# Patient Record
Sex: Female | Born: 1940
Health system: Southern US, Community
[De-identification: ages and names within clinical notes are randomized; demographics above are authoritative.]

## PROBLEM LIST (undated history)

## (undated) DIAGNOSIS — J189 Pneumonia, unspecified organism: Secondary | ICD-10-CM

## (undated) DIAGNOSIS — K2100 Gastro-esophageal reflux disease with esophagitis, without bleeding: Secondary | ICD-10-CM

## (undated) DIAGNOSIS — M810 Age-related osteoporosis without current pathological fracture: Secondary | ICD-10-CM

## (undated) DIAGNOSIS — E7800 Pure hypercholesterolemia, unspecified: Secondary | ICD-10-CM

## (undated) DIAGNOSIS — Z860100 Personal history of colon polyps, unspecified: Secondary | ICD-10-CM

## (undated) DIAGNOSIS — R0600 Dyspnea, unspecified: Secondary | ICD-10-CM

## (undated) DIAGNOSIS — K449 Diaphragmatic hernia without obstruction or gangrene: Secondary | ICD-10-CM

## (undated) DIAGNOSIS — A310 Pulmonary mycobacterial infection: Secondary | ICD-10-CM

## (undated) DIAGNOSIS — D329 Benign neoplasm of meninges, unspecified: Secondary | ICD-10-CM

## (undated) DIAGNOSIS — K2970 Gastritis, unspecified, without bleeding: Secondary | ICD-10-CM

## (undated) DIAGNOSIS — D509 Iron deficiency anemia, unspecified: Secondary | ICD-10-CM

## (undated) DIAGNOSIS — Z9889 Other specified postprocedural states: Secondary | ICD-10-CM

## (undated) DIAGNOSIS — K219 Gastro-esophageal reflux disease without esophagitis: Secondary | ICD-10-CM

## (undated) DIAGNOSIS — M5481 Occipital neuralgia: Secondary | ICD-10-CM

## (undated) DIAGNOSIS — I700 Atherosclerosis of aorta: Secondary | ICD-10-CM

## (undated) DIAGNOSIS — I10 Essential (primary) hypertension: Secondary | ICD-10-CM

## (undated) DIAGNOSIS — E119 Type 2 diabetes mellitus without complications: Secondary | ICD-10-CM

## (undated) DIAGNOSIS — J45909 Unspecified asthma, uncomplicated: Secondary | ICD-10-CM

## (undated) DIAGNOSIS — N3946 Mixed incontinence: Secondary | ICD-10-CM

## (undated) DIAGNOSIS — I639 Cerebral infarction, unspecified: Secondary | ICD-10-CM

## (undated) DIAGNOSIS — N6019 Diffuse cystic mastopathy of unspecified breast: Secondary | ICD-10-CM

## (undated) HISTORY — DX: Gastro-esophageal reflux disease with esophagitis: K21.0

## (undated) HISTORY — DX: Essential (primary) hypertension: I10

## (undated) HISTORY — DX: Diffuse cystic mastopathy of unspecified breast: N60.19

## (undated) HISTORY — PX: BREAST EXCISIONAL BIOPSY: SUR124

## (undated) HISTORY — DX: Pure hypercholesterolemia, unspecified: E78.00

## (undated) HISTORY — DX: Personal history of colon polyps, unspecified: Z86.0100

## (undated) HISTORY — PX: EYE SURGERY: SHX253

## (undated) HISTORY — DX: Gastro-esophageal reflux disease with esophagitis, without bleeding: K21.00

## (undated) HISTORY — PX: HEMORRHOID SURGERY: SHX153

## (undated) HISTORY — PX: FOOT SURGERY: SHX648

## (undated) HISTORY — DX: Personal history of colonic polyps: Z86.010

## (undated) HISTORY — DX: Gastritis, unspecified, without bleeding: K29.70

## (undated) HISTORY — PX: NASAL SEPTUM SURGERY: SHX37

## (undated) HISTORY — DX: Age-related osteoporosis without current pathological fracture: M81.0

## (undated) HISTORY — PX: OTHER SURGICAL HISTORY: SHX169

## (undated) HISTORY — PX: BREAST SURGERY: SHX581

## (undated) HISTORY — DX: Pulmonary mycobacterial infection: A31.0

## (undated) MED FILL — Iron Sucrose Inj 20 MG/ML (Fe Equiv): INTRAVENOUS | Qty: 10 | Status: AC

---

## 1996-05-15 HISTORY — PX: ABDOMINAL HYSTERECTOMY: SHX81

## 2004-03-10 ENCOUNTER — Ambulatory Visit: Payer: Self-pay | Admitting: Internal Medicine

## 2005-03-22 ENCOUNTER — Ambulatory Visit: Payer: Self-pay | Admitting: Internal Medicine

## 2005-03-31 ENCOUNTER — Ambulatory Visit: Payer: Self-pay | Admitting: Internal Medicine

## 2005-04-12 ENCOUNTER — Ambulatory Visit: Payer: Self-pay | Admitting: Internal Medicine

## 2005-04-19 ENCOUNTER — Ambulatory Visit: Payer: Self-pay | Admitting: Internal Medicine

## 2005-05-15 ENCOUNTER — Ambulatory Visit: Payer: Self-pay | Admitting: Internal Medicine

## 2005-05-23 ENCOUNTER — Ambulatory Visit: Payer: Self-pay | Admitting: Internal Medicine

## 2005-07-25 ENCOUNTER — Ambulatory Visit: Payer: Self-pay | Admitting: Internal Medicine

## 2005-07-25 ENCOUNTER — Ambulatory Visit: Payer: Self-pay | Admitting: Urology

## 2005-09-19 ENCOUNTER — Ambulatory Visit: Payer: Self-pay | Admitting: Gastroenterology

## 2005-10-03 ENCOUNTER — Ambulatory Visit: Payer: Self-pay | Admitting: Gastroenterology

## 2005-10-30 ENCOUNTER — Ambulatory Visit: Payer: Self-pay | Admitting: Internal Medicine

## 2006-02-05 ENCOUNTER — Ambulatory Visit: Payer: Self-pay | Admitting: Surgery

## 2006-02-12 HISTORY — PX: HERNIA REPAIR: SHX51

## 2006-02-12 HISTORY — PX: OTHER SURGICAL HISTORY: SHX169

## 2006-04-17 ENCOUNTER — Ambulatory Visit: Payer: Self-pay | Admitting: Internal Medicine

## 2006-04-24 ENCOUNTER — Ambulatory Visit: Payer: Self-pay | Admitting: Internal Medicine

## 2007-05-01 ENCOUNTER — Ambulatory Visit: Payer: Self-pay | Admitting: Internal Medicine

## 2007-05-06 ENCOUNTER — Ambulatory Visit: Payer: Self-pay | Admitting: Internal Medicine

## 2007-07-05 ENCOUNTER — Ambulatory Visit: Payer: Self-pay | Admitting: Unknown Physician Specialty

## 2007-07-09 ENCOUNTER — Ambulatory Visit: Payer: Self-pay | Admitting: Urology

## 2007-08-07 ENCOUNTER — Ambulatory Visit: Payer: Self-pay | Admitting: Urology

## 2007-12-02 ENCOUNTER — Ambulatory Visit: Payer: Self-pay | Admitting: Internal Medicine

## 2008-05-05 ENCOUNTER — Ambulatory Visit: Payer: Self-pay | Admitting: Internal Medicine

## 2008-05-15 HISTORY — PX: BREAST EXCISIONAL BIOPSY: SUR124

## 2008-10-13 ENCOUNTER — Ambulatory Visit: Payer: Self-pay | Admitting: Internal Medicine

## 2008-10-20 ENCOUNTER — Ambulatory Visit: Payer: Self-pay | Admitting: Internal Medicine

## 2008-10-28 ENCOUNTER — Ambulatory Visit: Payer: Self-pay | Admitting: Internal Medicine

## 2008-10-30 ENCOUNTER — Ambulatory Visit: Payer: Self-pay | Admitting: Internal Medicine

## 2008-11-09 ENCOUNTER — Ambulatory Visit: Payer: Self-pay | Admitting: Internal Medicine

## 2008-11-12 ENCOUNTER — Ambulatory Visit: Payer: Self-pay | Admitting: Internal Medicine

## 2008-12-15 ENCOUNTER — Emergency Department: Payer: Self-pay | Admitting: Internal Medicine

## 2009-05-13 ENCOUNTER — Ambulatory Visit: Payer: Self-pay | Admitting: Family Medicine

## 2009-09-01 ENCOUNTER — Ambulatory Visit: Payer: Self-pay

## 2009-10-19 ENCOUNTER — Ambulatory Visit: Payer: Self-pay | Admitting: Unknown Physician Specialty

## 2010-01-12 ENCOUNTER — Ambulatory Visit: Payer: Self-pay | Admitting: Sports Medicine

## 2010-06-24 ENCOUNTER — Ambulatory Visit: Payer: Self-pay | Admitting: Internal Medicine

## 2010-11-07 ENCOUNTER — Ambulatory Visit: Payer: Self-pay | Admitting: Internal Medicine

## 2011-08-07 ENCOUNTER — Ambulatory Visit: Payer: Self-pay | Admitting: Internal Medicine

## 2011-08-07 DIAGNOSIS — Z1231 Encounter for screening mammogram for malignant neoplasm of breast: Secondary | ICD-10-CM | POA: Diagnosis not present

## 2011-08-29 DIAGNOSIS — M069 Rheumatoid arthritis, unspecified: Secondary | ICD-10-CM | POA: Diagnosis not present

## 2011-08-29 DIAGNOSIS — J40 Bronchitis, not specified as acute or chronic: Secondary | ICD-10-CM | POA: Diagnosis not present

## 2011-08-29 DIAGNOSIS — F17200 Nicotine dependence, unspecified, uncomplicated: Secondary | ICD-10-CM | POA: Diagnosis not present

## 2011-08-29 DIAGNOSIS — R918 Other nonspecific abnormal finding of lung field: Secondary | ICD-10-CM | POA: Diagnosis not present

## 2011-08-29 DIAGNOSIS — J449 Chronic obstructive pulmonary disease, unspecified: Secondary | ICD-10-CM | POA: Diagnosis not present

## 2011-08-29 DIAGNOSIS — E785 Hyperlipidemia, unspecified: Secondary | ICD-10-CM | POA: Diagnosis not present

## 2011-08-29 DIAGNOSIS — R059 Cough, unspecified: Secondary | ICD-10-CM | POA: Diagnosis not present

## 2011-09-06 DIAGNOSIS — E7800 Pure hypercholesterolemia, unspecified: Secondary | ICD-10-CM | POA: Diagnosis not present

## 2011-09-06 DIAGNOSIS — R5383 Other fatigue: Secondary | ICD-10-CM | POA: Diagnosis not present

## 2011-09-06 DIAGNOSIS — M899 Disorder of bone, unspecified: Secondary | ICD-10-CM | POA: Diagnosis not present

## 2011-09-06 DIAGNOSIS — Z79899 Other long term (current) drug therapy: Secondary | ICD-10-CM | POA: Diagnosis not present

## 2011-09-06 DIAGNOSIS — D709 Neutropenia, unspecified: Secondary | ICD-10-CM | POA: Diagnosis not present

## 2011-09-06 DIAGNOSIS — I10 Essential (primary) hypertension: Secondary | ICD-10-CM | POA: Diagnosis not present

## 2011-09-06 DIAGNOSIS — R5381 Other malaise: Secondary | ICD-10-CM | POA: Diagnosis not present

## 2011-09-06 LAB — TSH: TSH: 2.85 u[IU]/mL (ref 0.41–5.90)

## 2011-11-07 ENCOUNTER — Ambulatory Visit: Payer: Self-pay | Admitting: Internal Medicine

## 2011-11-07 DIAGNOSIS — R1013 Epigastric pain: Secondary | ICD-10-CM | POA: Diagnosis not present

## 2011-11-07 DIAGNOSIS — Q619 Cystic kidney disease, unspecified: Secondary | ICD-10-CM | POA: Diagnosis not present

## 2011-11-21 ENCOUNTER — Ambulatory Visit: Payer: Self-pay | Admitting: Internal Medicine

## 2011-11-21 DIAGNOSIS — K7689 Other specified diseases of liver: Secondary | ICD-10-CM | POA: Diagnosis not present

## 2011-11-21 DIAGNOSIS — K449 Diaphragmatic hernia without obstruction or gangrene: Secondary | ICD-10-CM | POA: Diagnosis not present

## 2011-11-21 LAB — CREATININE, SERUM: EGFR (Non-African Amer.): 54 — ABNORMAL LOW

## 2011-11-28 DIAGNOSIS — R918 Other nonspecific abnormal finding of lung field: Secondary | ICD-10-CM | POA: Diagnosis not present

## 2011-11-28 DIAGNOSIS — J479 Bronchiectasis, uncomplicated: Secondary | ICD-10-CM | POA: Diagnosis not present

## 2011-11-28 DIAGNOSIS — A310 Pulmonary mycobacterial infection: Secondary | ICD-10-CM | POA: Diagnosis not present

## 2012-01-11 DIAGNOSIS — I10 Essential (primary) hypertension: Secondary | ICD-10-CM | POA: Diagnosis not present

## 2012-01-11 DIAGNOSIS — R1310 Dysphagia, unspecified: Secondary | ICD-10-CM | POA: Diagnosis not present

## 2012-01-11 DIAGNOSIS — K589 Irritable bowel syndrome without diarrhea: Secondary | ICD-10-CM | POA: Diagnosis not present

## 2012-02-13 ENCOUNTER — Ambulatory Visit: Payer: Self-pay | Admitting: Unknown Physician Specialty

## 2012-02-13 DIAGNOSIS — Z7982 Long term (current) use of aspirin: Secondary | ICD-10-CM | POA: Diagnosis not present

## 2012-02-13 DIAGNOSIS — K222 Esophageal obstruction: Secondary | ICD-10-CM | POA: Diagnosis not present

## 2012-02-13 DIAGNOSIS — K219 Gastro-esophageal reflux disease without esophagitis: Secondary | ICD-10-CM | POA: Diagnosis not present

## 2012-02-13 DIAGNOSIS — R1310 Dysphagia, unspecified: Secondary | ICD-10-CM | POA: Diagnosis not present

## 2012-02-13 DIAGNOSIS — N6019 Diffuse cystic mastopathy of unspecified breast: Secondary | ICD-10-CM | POA: Diagnosis not present

## 2012-02-13 DIAGNOSIS — R000 Tachycardia, unspecified: Secondary | ICD-10-CM | POA: Diagnosis not present

## 2012-02-13 DIAGNOSIS — K3189 Other diseases of stomach and duodenum: Secondary | ICD-10-CM | POA: Diagnosis not present

## 2012-02-13 DIAGNOSIS — E785 Hyperlipidemia, unspecified: Secondary | ICD-10-CM | POA: Diagnosis not present

## 2012-02-13 DIAGNOSIS — Z79899 Other long term (current) drug therapy: Secondary | ICD-10-CM | POA: Diagnosis not present

## 2012-02-13 DIAGNOSIS — M1990 Unspecified osteoarthritis, unspecified site: Secondary | ICD-10-CM | POA: Diagnosis not present

## 2012-02-13 DIAGNOSIS — D131 Benign neoplasm of stomach: Secondary | ICD-10-CM | POA: Diagnosis not present

## 2012-02-14 LAB — PATHOLOGY REPORT

## 2012-05-28 DIAGNOSIS — J479 Bronchiectasis, uncomplicated: Secondary | ICD-10-CM | POA: Diagnosis not present

## 2012-05-28 DIAGNOSIS — E785 Hyperlipidemia, unspecified: Secondary | ICD-10-CM | POA: Diagnosis not present

## 2012-05-28 DIAGNOSIS — L570 Actinic keratosis: Secondary | ICD-10-CM | POA: Diagnosis not present

## 2012-05-28 DIAGNOSIS — D485 Neoplasm of uncertain behavior of skin: Secondary | ICD-10-CM | POA: Diagnosis not present

## 2012-05-28 DIAGNOSIS — B079 Viral wart, unspecified: Secondary | ICD-10-CM | POA: Diagnosis not present

## 2012-05-28 DIAGNOSIS — A310 Pulmonary mycobacterial infection: Secondary | ICD-10-CM | POA: Diagnosis not present

## 2012-05-28 DIAGNOSIS — M069 Rheumatoid arthritis, unspecified: Secondary | ICD-10-CM | POA: Diagnosis not present

## 2012-05-31 ENCOUNTER — Ambulatory Visit: Payer: Self-pay | Admitting: Internal Medicine

## 2012-06-25 ENCOUNTER — Ambulatory Visit: Payer: Self-pay | Admitting: Internal Medicine

## 2012-08-13 DIAGNOSIS — A310 Pulmonary mycobacterial infection: Secondary | ICD-10-CM | POA: Diagnosis not present

## 2012-08-13 DIAGNOSIS — E785 Hyperlipidemia, unspecified: Secondary | ICD-10-CM | POA: Diagnosis not present

## 2012-08-13 DIAGNOSIS — M069 Rheumatoid arthritis, unspecified: Secondary | ICD-10-CM | POA: Diagnosis not present

## 2012-08-13 DIAGNOSIS — Z79899 Other long term (current) drug therapy: Secondary | ICD-10-CM | POA: Diagnosis not present

## 2012-08-13 DIAGNOSIS — J449 Chronic obstructive pulmonary disease, unspecified: Secondary | ICD-10-CM | POA: Diagnosis not present

## 2012-08-13 DIAGNOSIS — J479 Bronchiectasis, uncomplicated: Secondary | ICD-10-CM | POA: Diagnosis not present

## 2012-08-29 ENCOUNTER — Ambulatory Visit (INDEPENDENT_AMBULATORY_CARE_PROVIDER_SITE_OTHER): Payer: Medicare Other | Admitting: Internal Medicine

## 2012-08-29 ENCOUNTER — Encounter: Payer: Self-pay | Admitting: Internal Medicine

## 2012-08-29 VITALS — BP 120/80 | HR 66 | Temp 97.9°F | Ht 64.25 in | Wt 184.5 lb

## 2012-08-29 DIAGNOSIS — R200 Anesthesia of skin: Secondary | ICD-10-CM

## 2012-08-29 DIAGNOSIS — R0602 Shortness of breath: Secondary | ICD-10-CM

## 2012-08-29 DIAGNOSIS — R5383 Other fatigue: Secondary | ICD-10-CM

## 2012-08-29 DIAGNOSIS — K2990 Gastroduodenitis, unspecified, without bleeding: Secondary | ICD-10-CM

## 2012-08-29 DIAGNOSIS — I10 Essential (primary) hypertension: Secondary | ICD-10-CM

## 2012-08-29 DIAGNOSIS — E7800 Pure hypercholesterolemia, unspecified: Secondary | ICD-10-CM

## 2012-08-29 DIAGNOSIS — K2970 Gastritis, unspecified, without bleeding: Secondary | ICD-10-CM

## 2012-08-29 DIAGNOSIS — R209 Unspecified disturbances of skin sensation: Secondary | ICD-10-CM

## 2012-08-29 DIAGNOSIS — Z1239 Encounter for other screening for malignant neoplasm of breast: Secondary | ICD-10-CM

## 2012-08-29 DIAGNOSIS — A310 Pulmonary mycobacterial infection: Secondary | ICD-10-CM

## 2012-08-29 DIAGNOSIS — R5381 Other malaise: Secondary | ICD-10-CM | POA: Diagnosis not present

## 2012-08-29 DIAGNOSIS — Z86010 Personal history of colonic polyps: Secondary | ICD-10-CM

## 2012-08-29 LAB — CBC WITH DIFFERENTIAL/PLATELET
Basophils Relative: 0.8 % (ref 0.0–3.0)
Eosinophils Absolute: 0.2 10*3/uL (ref 0.0–0.7)
Eosinophils Relative: 3.9 % (ref 0.0–5.0)
HCT: 41.5 % (ref 36.0–46.0)
Lymphs Abs: 1.6 10*3/uL (ref 0.7–4.0)
MCHC: 33.7 g/dL (ref 30.0–36.0)
MCV: 94.6 fl (ref 78.0–100.0)
Monocytes Absolute: 0.4 10*3/uL (ref 0.1–1.0)
Neutrophils Relative %: 56.2 % (ref 43.0–77.0)
Platelets: 268 10*3/uL (ref 150.0–400.0)
WBC: 5.3 10*3/uL (ref 4.5–10.5)

## 2012-08-29 LAB — HEPATIC FUNCTION PANEL
Alkaline Phosphatase: 74 U/L (ref 39–117)
Bilirubin, Direct: 0.1 mg/dL (ref 0.0–0.3)
Total Bilirubin: 0.5 mg/dL (ref 0.3–1.2)

## 2012-08-29 LAB — LIPID PANEL
LDL Cholesterol: 63 mg/dL (ref 0–99)
VLDL: 18.2 mg/dL (ref 0.0–40.0)

## 2012-08-29 LAB — BASIC METABOLIC PANEL
BUN: 10 mg/dL (ref 6–23)
Chloride: 104 mEq/L (ref 96–112)
Creatinine, Ser: 0.8 mg/dL (ref 0.4–1.2)
Glucose, Bld: 103 mg/dL — ABNORMAL HIGH (ref 70–99)
Potassium: 4.3 mEq/L (ref 3.5–5.1)

## 2012-08-29 LAB — TSH: TSH: 1.91 u[IU]/mL (ref 0.35–5.50)

## 2012-08-29 MED ORDER — ESOMEPRAZOLE MAGNESIUM 40 MG PO CPDR: 40.0000 mg | DELAYED_RELEASE_CAPSULE | Freq: Two times a day (BID) | ORAL | Status: DC

## 2012-08-29 MED ORDER — ROSUVASTATIN CALCIUM 10 MG PO TABS: 10.0000 mg | ORAL_TABLET | Freq: Every day | ORAL | Status: DC

## 2012-08-29 MED ORDER — METOPROLOL SUCCINATE ER 25 MG PO TB24: 25.0000 mg | ORAL_TABLET | Freq: Two times a day (BID) | ORAL | Status: DC

## 2012-08-30 DIAGNOSIS — J45909 Unspecified asthma, uncomplicated: Secondary | ICD-10-CM | POA: Diagnosis not present

## 2012-08-30 DIAGNOSIS — E785 Hyperlipidemia, unspecified: Secondary | ICD-10-CM | POA: Diagnosis not present

## 2012-08-30 DIAGNOSIS — R918 Other nonspecific abnormal finding of lung field: Secondary | ICD-10-CM | POA: Diagnosis not present

## 2012-08-30 DIAGNOSIS — M069 Rheumatoid arthritis, unspecified: Secondary | ICD-10-CM | POA: Diagnosis not present

## 2012-08-30 DIAGNOSIS — A319 Mycobacterial infection, unspecified: Secondary | ICD-10-CM | POA: Diagnosis not present

## 2012-08-30 DIAGNOSIS — J449 Chronic obstructive pulmonary disease, unspecified: Secondary | ICD-10-CM | POA: Diagnosis not present

## 2012-08-31 ENCOUNTER — Encounter: Payer: Self-pay | Admitting: Internal Medicine

## 2012-08-31 DIAGNOSIS — A310 Pulmonary mycobacterial infection: Secondary | ICD-10-CM | POA: Insufficient documentation

## 2012-08-31 DIAGNOSIS — Z86010 Personal history of colonic polyps: Secondary | ICD-10-CM | POA: Insufficient documentation

## 2012-08-31 DIAGNOSIS — E7800 Pure hypercholesterolemia, unspecified: Secondary | ICD-10-CM | POA: Insufficient documentation

## 2012-08-31 DIAGNOSIS — K2970 Gastritis, unspecified, without bleeding: Secondary | ICD-10-CM | POA: Insufficient documentation

## 2012-08-31 DIAGNOSIS — I10 Essential (primary) hypertension: Secondary | ICD-10-CM | POA: Insufficient documentation

## 2012-08-31 NOTE — Assessment & Plan Note (Signed)
On crestor.  Low cholesterol diet and exercise.  Check lipid panel and liver function.  

## 2012-08-31 NOTE — Assessment & Plan Note (Signed)
Followed by pulmonary at [facility].  Has follow up tomorrow.  On advair.  SOB with exertion.  Follow.

## 2012-08-31 NOTE — Assessment & Plan Note (Signed)
EGD revealed hiatal hernia, nonobstructing Schatzki's ring, gastric polyp and gastritis.  On Nexium.  Follow.

## 2012-08-31 NOTE — Assessment & Plan Note (Signed)
Blood pressure doing well.  On metoprolol.  Follow.    

## 2012-08-31 NOTE — Progress Notes (Signed)
  Subjective:    [name], female    DOB: Oct 05, 1940, 72 y.o.   MRN: [id number]  HPI 72 year old female with past history of osteoarthritis, reflux esophagitis, hyperlipidemia, MAI and FCD who comes in today for a scheduled follow up.  She was a former pt of mine at [employer].  She states she has been doing relatively well.  She is being followed by pulmonary at [facility].   Had sputum cultures.  Has follow up tomorrow.  Using Advair bid now.  Is helping some.  Gets out of breath with exertion.  Previously had cardiac w/up.  Request referral back to cardiology for evaluation.  No acid reflux.  No nausea or vomiting.  No bowel change.  She does report preciously noticing what she described as fullness/numbness in the left side of her face and left upper arm.  Lasted for 1-2 weeks.  Resolved now.  Was not evaluated.  Was questioning if some of the facial symptoms were related to sinus.  Denies any facial drooping.  No speech change.     Past Medical History  Diagnosis Date  . Osteoporosis   . Fibrocystic breast disease   . Reflux esophagitis   . Hypercholesterolemia   . MAI (mycobacterium avium-intracellulare) infection   . Hypertension   . History of colon polyps   . Gastritis     EGD - schatzki's ring, gastritis and gastric polyps    Review of Systems    Patient denies any headache, lightheadedness or dizziness.  No chest pain, tightness or palpatations. Increased sob with exertion.  advair helping her breathing.  No increased acid reflux.  No abdominal pain or cramping.  No bowel change.  No urine change.  Being followed by pulmonary at [facility].  Request referral back to cardiology for the sob with exertion.  Previous facial numbness as outlined.         Objective:   Physical Exam Filed Vitals:   08/29/12 0849  BP: 120/80  Pulse: 66  Temp: 97.9 F (30.73 C)   72 year old female in no acute distress. HEENT:  Nares- clear.  Oropharynx - without lesions. NECK:  Supple.  Nontender.   No audible bruit.  HEART:  Appears to be regular. LUNGS:  No crackles or wheezing audible.  Respirations even and unlabored.  RADIAL PULSE:  Equal bilaterally.  ABDOMEN:  Soft, nontender.  Bowel sounds present and normal.  No audible abdominal bruit.   EXTREMITIES:  No increased edema present.  DP pulses palpable and equal bilaterally.          Assessment & Plan:  CARDIOVASCULAR.  Describes increased sob with exertion.  She request referral to cardiology.  Offered EKG today.  She declines.  Will place order for referral.  Continue toprol and aspirin.  Follow.   DERMATOLOGY.  Continue follow up with Dr Gwenlyn Fudge.   FACIAL NUMBNESS.  Unclear etiology. No reoccurring symptoms.  On aspirin.  Continue aspirin.  Check carotid ultrasound.  Check MRI brain.  Further w/up pending.  If any reoccurrence, she is to be evaluated immediately.   HEALTH MAINTENANCE.  Last physical 09/05/11.  S/p hysterectomy.  Mammogram 08/07/11 - Birads II. Colonoscopy 6/11 as outlined. Due follow up colonoscopy 2016.

## 2012-08-31 NOTE — Assessment & Plan Note (Signed)
Colonoscopy 6/11 revealed two small polyps and internal hemorrhoids.  Due follow up colonoscopy 2016.   

## 2012-09-05 ENCOUNTER — Ambulatory Visit: Payer: Self-pay | Admitting: Internal Medicine

## 2012-09-05 DIAGNOSIS — R209 Unspecified disturbances of skin sensation: Secondary | ICD-10-CM | POA: Diagnosis not present

## 2012-09-06 ENCOUNTER — Telehealth: Payer: Self-pay | Admitting: *Deleted

## 2012-09-06 NOTE — Telephone Encounter (Signed)
Spoke with pt & informed her of her MRI Brain results from 09/05/12 (no acute abnormality)

## 2012-09-10 ENCOUNTER — Telehealth: Payer: Self-pay | Admitting: *Deleted

## 2012-09-10 NOTE — Telephone Encounter (Signed)
Notified pt of Carotid U/S results from 09/05/12

## 2012-09-19 DIAGNOSIS — K7689 Other specified diseases of liver: Secondary | ICD-10-CM | POA: Diagnosis not present

## 2012-09-19 DIAGNOSIS — J984 Other disorders of lung: Secondary | ICD-10-CM | POA: Diagnosis not present

## 2012-09-19 DIAGNOSIS — A319 Mycobacterial infection, unspecified: Secondary | ICD-10-CM | POA: Diagnosis not present

## 2012-09-19 DIAGNOSIS — K449 Diaphragmatic hernia without obstruction or gangrene: Secondary | ICD-10-CM | POA: Diagnosis not present

## 2012-09-20 DIAGNOSIS — J45909 Unspecified asthma, uncomplicated: Secondary | ICD-10-CM | POA: Diagnosis not present

## 2012-09-24 ENCOUNTER — Encounter: Payer: Self-pay | Admitting: Internal Medicine

## 2012-10-01 ENCOUNTER — Ambulatory Visit: Payer: Self-pay | Admitting: Internal Medicine

## 2012-10-01 DIAGNOSIS — Z1231 Encounter for screening mammogram for malignant neoplasm of breast: Secondary | ICD-10-CM | POA: Diagnosis not present

## 2012-10-16 DIAGNOSIS — A429 Actinomycosis, unspecified: Secondary | ICD-10-CM | POA: Diagnosis not present

## 2012-10-25 ENCOUNTER — Encounter: Payer: Self-pay | Admitting: Internal Medicine

## 2012-11-04 DIAGNOSIS — A420 Pulmonary actinomycosis: Secondary | ICD-10-CM | POA: Diagnosis not present

## 2012-11-19 ENCOUNTER — Encounter: Payer: 59 | Admitting: Internal Medicine

## 2012-11-22 DIAGNOSIS — J45909 Unspecified asthma, uncomplicated: Secondary | ICD-10-CM | POA: Diagnosis not present

## 2012-11-22 DIAGNOSIS — G4710 Hypersomnia, unspecified: Secondary | ICD-10-CM | POA: Diagnosis not present

## 2012-11-22 DIAGNOSIS — R109 Unspecified abdominal pain: Secondary | ICD-10-CM | POA: Diagnosis not present

## 2012-11-22 DIAGNOSIS — A4289 Other forms of actinomycosis: Secondary | ICD-10-CM | POA: Diagnosis not present

## 2012-12-30 ENCOUNTER — Other Ambulatory Visit: Payer: Self-pay | Admitting: *Deleted

## 2013-01-17 DIAGNOSIS — I700 Atherosclerosis of aorta: Secondary | ICD-10-CM | POA: Diagnosis not present

## 2013-01-17 DIAGNOSIS — R918 Other nonspecific abnormal finding of lung field: Secondary | ICD-10-CM | POA: Diagnosis not present

## 2013-01-17 DIAGNOSIS — K449 Diaphragmatic hernia without obstruction or gangrene: Secondary | ICD-10-CM | POA: Diagnosis not present

## 2013-03-28 ENCOUNTER — Telehealth: Payer: Self-pay | Admitting: Internal Medicine

## 2013-03-28 ENCOUNTER — Encounter: Payer: Self-pay | Admitting: Internal Medicine

## 2013-03-28 ENCOUNTER — Ambulatory Visit (INDEPENDENT_AMBULATORY_CARE_PROVIDER_SITE_OTHER): Payer: Medicare Other | Admitting: Internal Medicine

## 2013-03-28 VITALS — BP 120/80 | HR 81 | Temp 97.8°F | Ht 64.25 in | Wt 178.8 lb

## 2013-03-28 DIAGNOSIS — R51 Headache: Secondary | ICD-10-CM

## 2013-03-28 DIAGNOSIS — M542 Cervicalgia: Secondary | ICD-10-CM

## 2013-03-28 DIAGNOSIS — E7800 Pure hypercholesterolemia, unspecified: Secondary | ICD-10-CM

## 2013-03-28 DIAGNOSIS — R200 Anesthesia of skin: Secondary | ICD-10-CM

## 2013-03-28 DIAGNOSIS — A310 Pulmonary mycobacterial infection: Secondary | ICD-10-CM

## 2013-03-28 DIAGNOSIS — Z86010 Personal history of colonic polyps: Secondary | ICD-10-CM

## 2013-03-28 DIAGNOSIS — R209 Unspecified disturbances of skin sensation: Secondary | ICD-10-CM | POA: Diagnosis not present

## 2013-03-28 DIAGNOSIS — I10 Essential (primary) hypertension: Secondary | ICD-10-CM | POA: Diagnosis not present

## 2013-03-28 DIAGNOSIS — K2970 Gastritis, unspecified, without bleeding: Secondary | ICD-10-CM

## 2013-03-28 LAB — HEPATIC FUNCTION PANEL
ALT: 22 U/L (ref 0–35)
AST: 22 U/L (ref 0–37)
Alkaline Phosphatase: 73 U/L (ref 39–117)
Bilirubin, Direct: 0.1 mg/dL (ref 0.0–0.3)
Total Bilirubin: 0.6 mg/dL (ref 0.3–1.2)

## 2013-03-28 LAB — SEDIMENTATION RATE: Sed Rate: 13 mm/hr (ref 0–22)

## 2013-03-28 LAB — VITAMIN B12: Vitamin B-12: 523 pg/mL (ref 211–911)

## 2013-03-28 LAB — BASIC METABOLIC PANEL
Chloride: 101 mEq/L (ref 96–112)
GFR: 68.17 mL/min (ref >60.00)
Potassium: 4.5 mEq/L (ref 3.5–5.1)
Sodium: 137 mEq/L (ref 135–145)

## 2013-03-28 MED ORDER — BACLOFEN 5 MG HALF TABLET: 5.0000 mg | ORAL_TABLET | Freq: Every evening | ORAL | Status: DC | PRN

## 2013-03-28 MED ORDER — CYCLOBENZAPRINE HCL 5 MG PO TABS: 5.0000 mg | ORAL_TABLET | Freq: Every evening | ORAL | Status: DC | PRN

## 2013-03-28 NOTE — Progress Notes (Signed)
  Subjective:    [name], female    DOB: 1940/11/25, 72 y.o.   MRN: [id number]  Neck Pain   72 year old female with past history of osteoarthritis, reflux esophagitis, hyperlipidemia, MAI and FCD who comes in today for a scheduled follow up.  She states she has been doing relatively well.  She is being followed by pulmonary at [facility].   No acid reflux.  No nausea or vomiting.  No bowel change.  She does report some increased neck pain and pain extended up the back of her head.  Some pain into the left shoulder.  When she looks to the left - pain worsens.  Also feels like bees stinging - arms.  No chest pain or tightness.  States she recently saw cardiology.  Everything checked out fine.  Breathing stable.     Past Medical History  Diagnosis Date  . Osteoporosis   . Fibrocystic breast disease   . Reflux esophagitis   . Hypercholesterolemia   . MAI (mycobacterium avium-intracellulare) infection   . Hypertension   . History of colon polyps   . Gastritis     EGD - schatzki's ring, gastritis and gastric polyps    Review of Systems  Musculoskeletal: Positive for neck pain.  Patient denies any lightheadedness or dizziness.  Neck pain and headache as outlined. Some arm stinging - upper arms.   No chest pain, tightness or palpatations.  Breathing appears to be stable.  No increased acid reflux.  No abdominal pain or cramping.  No bowel change.  No urine change.  Being followed by pulmonary at [facility].          Objective:   Physical Exam  Filed Vitals:   03/28/13 1400  BP: 120/80  Pulse: 81  Temp: 97.8 F (41.35 C)   72 year old female in no acute distress. HEENT:  Nares- clear.  Oropharynx - without lesions. NECK:  Supple.  Nontender.  No audible bruit.  Increased pain in the neck and shoulder with looking to the left > right.   HEART:  Appears to be regular. LUNGS:  No crackles or wheezing audible.  Respirations even and unlabored.  RADIAL PULSE:  Equal bilaterally.   ABDOMEN:  Soft, nontender.  Bowel sounds present and normal.  No audible abdominal bruit.   EXTREMITIES:  No increased edema present.  DP pulses palpable and equal bilaterally.          Assessment & Plan:  CARDIOVASCULAR.  Just saw cardiology.  Currently stable.    DERMATOLOGY.  Continue follow up with Dr Gwenlyn Fudge.   HEALTH MAINTENANCE.  Last physical 09/05/11.  S/p hysterectomy.  Mammogram 10/01/12 - Birads II. Colonoscopy 6/11 as outlined. Due follow up colonoscopy 2016.

## 2013-03-28 NOTE — Assessment & Plan Note (Signed)
Colonoscopy 6/11 revealed two small polyps and internal hemorrhoids.  Due follow up colonoscopy 2016.   

## 2013-03-28 NOTE — Assessment & Plan Note (Addendum)
Followed by pulmonary at [facility].  Has follow up tomorrow.  On advair.  Follow.

## 2013-03-28 NOTE — Telephone Encounter (Signed)
Baclofen called in to [facility].  Called in baclofen 10mg  take 1/2 tablet q hs prn #10 with no refills.

## 2013-03-28 NOTE — Telephone Encounter (Signed)
Pt states pharmacy contacted her, rx for medication is not covered by insurance.  States they were faxing over to get clearance to give different medication that insurance will cover.  Pt asking if new [facility] is sent [facility] [place].  States she has moved and [facility] is closer to her.

## 2013-03-31 ENCOUNTER — Encounter: Payer: Self-pay | Admitting: *Deleted

## 2013-03-31 ENCOUNTER — Encounter: Payer: Self-pay | Admitting: Internal Medicine

## 2013-03-31 DIAGNOSIS — M542 Cervicalgia: Secondary | ICD-10-CM | POA: Insufficient documentation

## 2013-03-31 NOTE — Assessment & Plan Note (Signed)
Blood pressure doing well.  On metoprolol.  Follow.    

## 2013-03-31 NOTE — Assessment & Plan Note (Signed)
EGD revealed hiatal hernia, nonobstructing Schatzki's ring, gastric polyp and gastritis.  On Nexium.  Follow.  Would recommend avoiding NSAIDS.    

## 2013-03-31 NOTE — Assessment & Plan Note (Addendum)
Feel that this is contributing to her headaches.  Will check C-Spine MRI.  Had the neck pain, shoulder pain and arm numbness.   Had recent mri brain that revealed no acute abnormality.  Flexeril 5mg  q hs as directed.  Discussed possible side effects.  Follow.  Check ESR.

## 2013-03-31 NOTE — Assessment & Plan Note (Signed)
On crestor.  Low cholesterol diet and exercise.  Follow lipid panel and liver function.      

## 2013-04-01 ENCOUNTER — Telehealth: Payer: Self-pay | Admitting: *Deleted

## 2013-04-01 NOTE — Telephone Encounter (Signed)
Refill Request  Cyclobenzaprine 5 mg tablet

## 2013-04-04 DIAGNOSIS — L820 Inflamed seborrheic keratosis: Secondary | ICD-10-CM | POA: Diagnosis not present

## 2013-04-04 DIAGNOSIS — J45909 Unspecified asthma, uncomplicated: Secondary | ICD-10-CM | POA: Diagnosis not present

## 2013-04-04 DIAGNOSIS — L570 Actinic keratosis: Secondary | ICD-10-CM | POA: Diagnosis not present

## 2013-04-08 ENCOUNTER — Ambulatory Visit: Payer: Self-pay | Admitting: Internal Medicine

## 2013-04-08 DIAGNOSIS — M4802 Spinal stenosis, cervical region: Secondary | ICD-10-CM | POA: Diagnosis not present

## 2013-04-08 DIAGNOSIS — R209 Unspecified disturbances of skin sensation: Secondary | ICD-10-CM | POA: Diagnosis not present

## 2013-04-08 DIAGNOSIS — Q762 Congenital spondylolisthesis: Secondary | ICD-10-CM | POA: Diagnosis not present

## 2013-04-15 ENCOUNTER — Telehealth: Payer: Self-pay | Admitting: Internal Medicine

## 2013-04-15 DIAGNOSIS — R200 Anesthesia of skin: Secondary | ICD-10-CM

## 2013-04-15 DIAGNOSIS — M542 Cervicalgia: Secondary | ICD-10-CM

## 2013-04-15 NOTE — Telephone Encounter (Signed)
Pt notified of mri results and need for neurosurgery referral.  Order placed for referral to Dr Elenore Paddy.

## 2013-05-01 ENCOUNTER — Encounter: Payer: Self-pay | Admitting: Internal Medicine

## 2013-05-23 ENCOUNTER — Other Ambulatory Visit: Payer: Self-pay | Admitting: *Deleted

## 2013-05-23 ENCOUNTER — Telehealth: Payer: Self-pay | Admitting: Internal Medicine

## 2013-05-23 MED ORDER — PANTOPRAZOLE SODIUM 40 MG PO TBEC: 40.0000 mg | DELAYED_RELEASE_TABLET | Freq: Every day | ORAL | Status: DC

## 2013-05-23 NOTE — Telephone Encounter (Signed)
Pt scheduled CPE for 5/19 & would like to have labs done same day. Pt states that she will call on Monday to schedule her mammogram (after 10/01/13). Protonix was sent electronically to [facility]

## 2013-05-23 NOTE — Telephone Encounter (Signed)
The insurance will not pay for Nexium . Do you have a substitute for this medication that the insurance company will pay for the over the counter does not work for her.   The patient is wanting to switch her physical to the same month as her mammogram . She stated that it is in October , but she wants to be sure . She wants someone to look back to see when she had her last physical at [facility] before scheduling.

## 2013-05-23 NOTE — Telephone Encounter (Signed)
Form in your folder

## 2013-05-23 NOTE — Telephone Encounter (Signed)
Notify pt that we can try generic protonix but I am not sure which medication her insurance will cover.  She can check with her insurance and see what is covered.  Regarding her mammogram - she had this in 4/14.  She is overdue her physical.  Her last physical was in 2013.  She needs to go ahead and schedule her physical.

## 2013-06-02 ENCOUNTER — Ambulatory Visit: Payer: Medicare Other | Admitting: Adult Health

## 2013-06-04 ENCOUNTER — Ambulatory Visit: Payer: Medicare Other | Admitting: Adult Health

## 2013-06-04 ENCOUNTER — Emergency Department: Payer: Self-pay | Admitting: Emergency Medicine

## 2013-06-04 DIAGNOSIS — S0990XA Unspecified injury of head, initial encounter: Secondary | ICD-10-CM | POA: Diagnosis not present

## 2013-06-04 DIAGNOSIS — S060X0A Concussion without loss of consciousness, initial encounter: Secondary | ICD-10-CM | POA: Diagnosis not present

## 2013-06-04 DIAGNOSIS — S0003XA Contusion of scalp, initial encounter: Secondary | ICD-10-CM | POA: Diagnosis not present

## 2013-06-10 ENCOUNTER — Encounter: Payer: Medicare Other | Admitting: Internal Medicine

## 2013-07-18 ENCOUNTER — Encounter (INDEPENDENT_AMBULATORY_CARE_PROVIDER_SITE_OTHER): Payer: Self-pay

## 2013-07-18 ENCOUNTER — Ambulatory Visit (INDEPENDENT_AMBULATORY_CARE_PROVIDER_SITE_OTHER): Payer: Medicare Other | Admitting: Adult Health

## 2013-07-18 ENCOUNTER — Encounter: Payer: Self-pay | Admitting: Adult Health

## 2013-07-18 VITALS — BP 146/70 | HR 86 | Temp 98.0°F | Resp 16 | Wt 190.0 lb

## 2013-07-18 DIAGNOSIS — H9209 Otalgia, unspecified ear: Secondary | ICD-10-CM | POA: Diagnosis not present

## 2013-07-18 DIAGNOSIS — M542 Cervicalgia: Secondary | ICD-10-CM | POA: Diagnosis not present

## 2013-07-18 DIAGNOSIS — H9202 Otalgia, left ear: Secondary | ICD-10-CM

## 2013-07-18 MED ORDER — ACETAMINOPHEN-CODEINE #3 300-30 MG PO TABS: 1.0000 | ORAL_TABLET | Freq: Four times a day (QID) | ORAL | Status: DC | PRN

## 2013-07-18 MED ORDER — AMOXICILLIN-POT CLAVULANATE 875-125 MG PO TABS: 1.0000 | ORAL_TABLET | Freq: Two times a day (BID) | ORAL | Status: DC

## 2013-07-18 NOTE — Progress Notes (Signed)
[name], female   DOB: 1940-11-07, 73 y.o.   MRN: [id number]    Subjective:    [name], female    DOB: [birth year], 73 y.o.   MRN: [id number]  HPI  Pt is a pleasant 73 y/o female who presents to clinic for head/neck pain that has been ongoing for 3-4 months. Reports head feels like it is on fire. Was referred to Dr. Vertell Limber but was not able to keep appointment 2/2 having the flu. She has not been able to obtain relief from her symptoms. She is requesting something to help her pain until she is able to see Dr. Vertell Limber. She also reports pain in her right ear. This has been ongoing for ~ 5 days.   Past Medical History  Diagnosis Date  . Osteoporosis   . Fibrocystic breast disease   . Reflux esophagitis   . Hypercholesterolemia   . MAI (mycobacterium avium-intracellulare) infection   . Hypertension   . History of colon polyps   . Gastritis     EGD - schatzki's ring, gastritis and gastric polyps    Current Outpatient Prescriptions on File Prior to Visit  Medication Sig Dispense Refill  . aspirin (ASPIRIN EC) 81 MG EC tablet Take 81 mg by mouth daily. Swallow whole.      . baclofen (LIORESAL) 5 mg TABS tablet Take 0.5 tablets (5 mg total) by mouth at bedtime as needed for muscle spasms.  15 tablet  0  . calcium carbonate (OS-CAL) 600 MG TABS Take 600 mg by mouth 2 (two) times daily with a meal.      . Cholecalciferol (VITAMIN D3) 10000 UNITS capsule Take 10,000 Units by mouth daily.      . cyanocobalamin 500 MCG tablet Take 500 mcg by mouth daily.      Marland Kitchen esomeprazole (NEXIUM) 40 MG capsule Take 1 capsule (40 mg total) by mouth 2 (two) times daily. Take 1 capsule by mouth twice a day  180 capsule  3  . fish oil-omega-3 fatty acids 1000 MG capsule Take 1,000 mg by mouth daily. Take 1 capsule by mouth once a day      . Lysine 500 MG TABS Take 500 mg by mouth daily.      . metoprolol succinate (TOPROL-XL) 25 MG 24 hr tablet Take 1 tablet (25 mg total) by mouth 2  (two) times daily.  180 tablet  3  . Multiple Vitamin (MULTIVITAMIN WITH MINERALS) TABS Take 1 tablet by mouth daily.      . pantoprazole (PROTONIX) 40 MG tablet Take 1 tablet (40 mg total) by mouth daily.  90 tablet  2  . Potassium 99 MG TABS Take 99 mg by mouth once.      . rosuvastatin (CRESTOR) 10 MG tablet Take 1 tablet (10 mg total) by mouth daily.  90 tablet  3  . sucralfate (CARAFATE) 1 G tablet Take 1 g by mouth 4 (four) times daily as needed.        No current facility-administered medications on file prior to visit.     Review of Systems  Constitutional: Negative for fever and chills.  HENT: Positive for ear pain.   Cardiovascular: Negative.   Musculoskeletal: Positive for back pain and neck pain.  Neurological: Positive for headaches.  Psychiatric/Behavioral: Negative.   All other systems reviewed and are negative.       Objective:  BP 146/70  Pulse 86  Temp(Src) 98 F (36.7 C) (Oral)  Resp 16  Wt 190 lb (86.183 kg)  SpO2 95%   Physical Exam  Constitutional: She is oriented to person, place, and time. No distress.  HENT:  Head: Normocephalic and atraumatic.  Right Ear: External ear normal.  Left TM erythematous. Otitis media  Eyes: Conjunctivae and EOM are normal. Pupils are equal, round, and reactive to light.  Cardiovascular: Normal rate, regular rhythm and normal heart sounds.  Exam reveals no gallop.   No murmur heard. Pulmonary/Chest: Effort normal and breath sounds normal. No respiratory distress. She has no wheezes.  Musculoskeletal: Normal range of motion.  Neurological: She is alert and oriented to person, place, and time.  Skin: Skin is warm and dry.  Psychiatric: She has a normal mood and affect. Her behavior is normal. Judgment and thought content normal.       Assessment & Plan:   1. Left ear pain Otitis. Start Augmentin bid x 7 days.  2. Neck pain Re-refer to Dr. Cherlynn Kaiser. Tylenol #3 short course for pain.

## 2013-07-18 NOTE — Patient Instructions (Signed)
  Start Augmentin twice a day for 7 days.  Tylenol with codeine 1 tablet every 6 hours as needed for pain in the neck.  We will try to contact Dr. Melven Sartorius office on Monday to see if we can get you in to see him sooner

## 2013-07-18 NOTE — Progress Notes (Signed)
Pre visit review using our clinic review tool, if applicable. No additional management support is needed unless otherwise documented below in the visit note. 

## 2013-07-20 DIAGNOSIS — H9202 Otalgia, left ear: Secondary | ICD-10-CM | POA: Insufficient documentation

## 2013-07-21 ENCOUNTER — Telehealth: Payer: Self-pay | Admitting: Emergency Medicine

## 2013-07-21 NOTE — Telephone Encounter (Signed)
LVM for Angela Cohen at Dr. Melven Sartorius office to return my call so we can r/s the pt apt. Pt can not go on 07/29/13

## 2013-07-22 NOTE — Telephone Encounter (Signed)
Haley lvm asking for me to return her call. LVM for her to return my call

## 2013-07-28 NOTE — Telephone Encounter (Signed)
LDVM on cell phone informing patient to contact [facility] directly to r/s apt.

## 2013-07-28 NOTE — Telephone Encounter (Signed)
Haley lvm stating it would be better for the patient to call to schedule an apt since we are playing phone tag.

## 2013-08-01 ENCOUNTER — Ambulatory Visit: Payer: Medicare Other | Admitting: Internal Medicine

## 2013-08-04 ENCOUNTER — Other Ambulatory Visit: Payer: Self-pay | Admitting: *Deleted

## 2013-08-04 MED ORDER — METOPROLOL SUCCINATE ER 25 MG PO TB24: 25.0000 mg | ORAL_TABLET | Freq: Two times a day (BID) | ORAL | Status: DC

## 2013-09-01 ENCOUNTER — Telehealth: Payer: Self-pay | Admitting: Internal Medicine

## 2013-09-01 NOTE — Telephone Encounter (Signed)
Pt came in to office with records from [facility].  Copies made and placed in Dr. Bary Leriche box.  States she needs an appt to have a scan done.  States she is going to neurosurgery 5/11 for bad headaches.

## 2013-09-01 NOTE — Telephone Encounter (Signed)
States [facility] in [place] told her if her head gets hot she needs to have scan done.  Says her head is "on fire".

## 2013-09-02 NOTE — Telephone Encounter (Signed)
Advised pt of Dr. Bary Leriche advice.  Pt states that Dr. Nicki Reaper ordered the scan the last time for her.  States that Dr. Nicki Reaper should be the one to contact the Disease [facility] in [facility] and ask them to schedule it at [facility].  States she cannot go to [facility]; it is too hard to get there.  She states she needs to have it done as she is having too many headaches and does not want to wait around.

## 2013-09-02 NOTE — Telephone Encounter (Signed)
If she has seen someone about this and they informed her that she needed a scan, she needs to contact them and let her know of her current symptoms.  This way they can order the test and do the w/up that they felt needed to be done. Let me know if any problems.

## 2013-09-02 NOTE — Telephone Encounter (Signed)
Angela Cohen, please notify pt that I ordered MRI last 4/14 for facial numbness.  It revealed no acute abnormality.  If she is having increased headache and severe head pain, I cannot just order another mri.  She will need to be evaluated.  The reason why I suggested she call infectious disease, is that if she has recently discussed this with them and they laid out a plan - they can pursue the w/up that they intended.  They can order a test to be done here in town if necessary.  There is [facility] in [place].  If she is having this severe pain and feels the need for emergent scanning, then needs ER evaluation.

## 2013-09-02 NOTE — Telephone Encounter (Signed)
Pt notified of recommendations

## 2013-09-22 DIAGNOSIS — M069 Rheumatoid arthritis, unspecified: Secondary | ICD-10-CM | POA: Diagnosis not present

## 2013-09-22 DIAGNOSIS — M47812 Spondylosis without myelopathy or radiculopathy, cervical region: Secondary | ICD-10-CM | POA: Diagnosis not present

## 2013-09-22 DIAGNOSIS — M4712 Other spondylosis with myelopathy, cervical region: Secondary | ICD-10-CM | POA: Diagnosis not present

## 2013-09-22 DIAGNOSIS — M19019 Primary osteoarthritis, unspecified shoulder: Secondary | ICD-10-CM | POA: Diagnosis not present

## 2013-09-30 ENCOUNTER — Encounter: Payer: Self-pay | Admitting: Internal Medicine

## 2013-09-30 ENCOUNTER — Ambulatory Visit (INDEPENDENT_AMBULATORY_CARE_PROVIDER_SITE_OTHER): Payer: Medicare Other | Admitting: Internal Medicine

## 2013-09-30 ENCOUNTER — Encounter (INDEPENDENT_AMBULATORY_CARE_PROVIDER_SITE_OTHER): Payer: Self-pay

## 2013-09-30 VITALS — BP 110/70 | HR 68 | Temp 97.8°F | Ht 64.5 in | Wt 187.5 lb

## 2013-09-30 DIAGNOSIS — R350 Frequency of micturition: Secondary | ICD-10-CM | POA: Diagnosis not present

## 2013-09-30 DIAGNOSIS — J069 Acute upper respiratory infection, unspecified: Secondary | ICD-10-CM

## 2013-09-30 DIAGNOSIS — K2970 Gastritis, unspecified, without bleeding: Secondary | ICD-10-CM

## 2013-09-30 DIAGNOSIS — I10 Essential (primary) hypertension: Secondary | ICD-10-CM

## 2013-09-30 DIAGNOSIS — Z860100 Personal history of colon polyps, unspecified: Secondary | ICD-10-CM

## 2013-09-30 DIAGNOSIS — K2990 Gastroduodenitis, unspecified, without bleeding: Secondary | ICD-10-CM

## 2013-09-30 DIAGNOSIS — A310 Pulmonary mycobacterial infection: Secondary | ICD-10-CM

## 2013-09-30 DIAGNOSIS — E7800 Pure hypercholesterolemia, unspecified: Secondary | ICD-10-CM

## 2013-09-30 DIAGNOSIS — R51 Headache: Secondary | ICD-10-CM

## 2013-09-30 DIAGNOSIS — M542 Cervicalgia: Secondary | ICD-10-CM

## 2013-09-30 LAB — CBC WITH DIFFERENTIAL/PLATELET
BASOS ABS: 0.1 10*3/uL (ref 0.0–0.1)
Basophils Relative: 0.8 % (ref 0.0–3.0)
Eosinophils Absolute: 0.2 10*3/uL (ref 0.0–0.7)
Eosinophils Relative: 3.4 % (ref 0.0–5.0)
HCT: 39.8 % (ref 36.0–46.0)
HEMOGLOBIN: 13.3 g/dL (ref 12.0–15.0)
LYMPHS PCT: 23.6 % (ref 12.0–46.0)
Lymphs Abs: 1.5 10*3/uL (ref 0.7–4.0)
MCHC: 33.5 g/dL (ref 30.0–36.0)
MCV: 95.5 fl (ref 78.0–100.0)
Monocytes Absolute: 0.5 10*3/uL (ref 0.1–1.0)
Monocytes Relative: 8.4 % (ref 3.0–12.0)
Neutro Abs: 4 10*3/uL (ref 1.4–7.7)
Neutrophils Relative %: 63.8 % (ref 43.0–77.0)
PLATELETS: 233 10*3/uL (ref 150.0–400.0)
RBC: 4.17 Mil/uL (ref 3.87–5.11)
RDW: 12.7 % (ref 11.5–15.5)
WBC: 6.3 10*3/uL (ref 4.0–10.5)

## 2013-09-30 LAB — URINALYSIS, ROUTINE W REFLEX MICROSCOPIC
Bilirubin Urine: NEGATIVE
Hgb urine dipstick: NEGATIVE
Ketones, ur: NEGATIVE
LEUKOCYTES UA: NEGATIVE
Nitrite: NEGATIVE
RBC / HPF: NONE SEEN (ref 0–?)
SPECIFIC GRAVITY, URINE: 1.025 (ref 1.000–1.030)
Total Protein, Urine: NEGATIVE
UROBILINOGEN UA: 0.2 (ref 0.0–1.0)
Urine Glucose: NEGATIVE
pH: 6 (ref 5.0–8.0)

## 2013-09-30 LAB — LIPID PANEL
CHOLESTEROL: 149 mg/dL (ref 0–200)
HDL: 65.2 mg/dL (ref >39.00)
LDL Cholesterol: 63 mg/dL (ref 0–99)
Total CHOL/HDL Ratio: 2
Triglycerides: 105 mg/dL (ref 0.0–149.0)
VLDL: 21 mg/dL (ref 0.0–40.0)

## 2013-09-30 LAB — BASIC METABOLIC PANEL
BUN: 11 mg/dL (ref 6–23)
CO2: 29 mEq/L (ref 19–32)
Calcium: 8.8 mg/dL (ref 8.4–10.5)
Chloride: 103 mEq/L (ref 96–112)
Creatinine, Ser: 0.8 mg/dL (ref 0.4–1.2)
GFR: 70.88 mL/min (ref >60.00)
Glucose, Bld: 99 mg/dL (ref 70–99)
POTASSIUM: 4.4 meq/L (ref 3.5–5.1)
Sodium: 138 mEq/L (ref 135–145)

## 2013-09-30 LAB — HEPATIC FUNCTION PANEL
ALBUMIN: 3.8 g/dL (ref 3.5–5.2)
ALT: 19 U/L (ref 0–35)
AST: 22 U/L (ref 0–37)
Alkaline Phosphatase: 77 U/L (ref 39–117)
Bilirubin, Direct: 0.1 mg/dL (ref 0.0–0.3)
TOTAL PROTEIN: 7 g/dL (ref 6.0–8.3)
Total Bilirubin: 0.6 mg/dL (ref 0.2–1.2)

## 2013-09-30 LAB — TSH: TSH: 2.42 u[IU]/mL (ref 0.35–4.50)

## 2013-09-30 MED ORDER — SOLIFENACIN SUCCINATE 5 MG PO TABS: 5.0000 mg | ORAL_TABLET | Freq: Every day | ORAL | Status: DC

## 2013-09-30 MED ORDER — CLARITHROMYCIN 500 MG PO TABS: 500.0000 mg | ORAL_TABLET | Freq: Two times a day (BID) | ORAL | Status: DC

## 2013-09-30 NOTE — Progress Notes (Signed)
Pre visit review using our clinic review tool, if applicable. No additional management support is needed unless otherwise documented below in the visit note. 

## 2013-09-30 NOTE — Patient Instructions (Signed)
Saline nasal spray - flush nose at least 2-3x/day.   Mucinex DM in the am and Robitussin DM in the evening.

## 2013-10-01 ENCOUNTER — Telehealth: Payer: Self-pay | Admitting: Internal Medicine

## 2013-10-01 NOTE — Telephone Encounter (Signed)
States [facility] called pt and told her Dr. Nicki Reaper needs to call her insurance because medication prescribed is not being approved by insurance.  Pt states it is medication to help control incontinence but she is not sure of the name.  Pt states she needs the med asap.

## 2013-10-03 ENCOUNTER — Other Ambulatory Visit: Payer: Self-pay | Admitting: *Deleted

## 2013-10-03 LAB — CULTURE, URINE COMPREHENSIVE: Colony Count: 45000

## 2013-10-03 NOTE — Telephone Encounter (Signed)
[phone] Armed forces logistics/support/administrative officer)

## 2013-10-05 ENCOUNTER — Encounter: Payer: Self-pay | Admitting: Internal Medicine

## 2013-10-05 DIAGNOSIS — J069 Acute upper respiratory infection, unspecified: Secondary | ICD-10-CM | POA: Insufficient documentation

## 2013-10-05 DIAGNOSIS — R51 Headache: Secondary | ICD-10-CM

## 2013-10-05 DIAGNOSIS — R519 Headache, unspecified: Secondary | ICD-10-CM | POA: Insufficient documentation

## 2013-10-05 NOTE — Assessment & Plan Note (Signed)
Had MRI C-spine.  Saw NSU.  Felt no further w/up warranted.  Currently doing well.

## 2013-10-05 NOTE — Progress Notes (Signed)
Subjective:    [name], female    DOB: [birth year], 73 y.o.   MRN: [id number]  HPI 73 year old female with past history of osteoarthritis, reflux esophagitis, hyperlipidemia, MAI and FCD who comes in today to follow up on these issues as well as for a complete physical exam.   She states she is doing better.  She is being followed by pulmonary at [facility].   Has asthma.  On advair.  Has had mycobacterium avium isolated from a bronchoscopy.  Her breathing has been stable.  She has noticed over the last week, some increased chest congestion and head congestion.  Some yellow mucus production.  Has been taking tylenol.  No sob.  No chest pain or tightness.  No acid reflux.  No nausea or vomiting.  No bowel change.  Headache is better.  Has disc issues in her neck.  Saw neurosurgery.  No further w/up warranted.  Overall feels better.      Past Medical History  Diagnosis Date  . Osteoporosis   . Fibrocystic breast disease   . Reflux esophagitis   . Hypercholesterolemia   . MAI (mycobacterium avium-intracellulare) infection   . Hypertension   . History of colon polyps   . Gastritis     EGD - schatzki's ring, gastritis and gastric polyps    Current Outpatient Prescriptions on File Prior to Visit  Medication Sig Dispense Refill  . acetaminophen-codeine (TYLENOL #3) 300-30 MG per tablet Take 1 tablet by mouth every 6 (six) hours as needed for moderate pain.  30 tablet  0  . aspirin (ASPIRIN EC) 81 MG EC tablet Take 81 mg by mouth daily. Swallow whole.      . baclofen (LIORESAL) 5 mg TABS tablet Take 0.5 tablets (5 mg total) by mouth at bedtime as needed for muscle spasms.  15 tablet  0  . calcium carbonate (OS-CAL) 600 MG TABS Take 600 mg by mouth 2 (two) times daily with a meal.      . Cholecalciferol (VITAMIN D3) 10000 UNITS capsule Take 10,000 Units by mouth daily.      . cyanocobalamin 500 MCG tablet Take 500 mcg by mouth daily.      Marland Kitchen esomeprazole (NEXIUM) 40 MG capsule Take 1  capsule (40 mg total) by mouth 2 (two) times daily. Take 1 capsule by mouth twice a day  180 capsule  3  . fish oil-omega-3 fatty acids 1000 MG capsule Take 1,000 mg by mouth daily. Take 1 capsule by mouth once a day      . Lysine 500 MG TABS Take 500 mg by mouth daily.      . metoprolol succinate (TOPROL-XL) 25 MG 24 hr tablet Take 1 tablet (25 mg total) by mouth 2 (two) times daily. MUST KEEP APPOINTMENT ON 5/19 FOR FURTHER REFILLS  180 tablet  0  . Multiple Vitamin (MULTIVITAMIN WITH MINERALS) TABS Take 1 tablet by mouth daily.      . pantoprazole (PROTONIX) 40 MG tablet Take 1 tablet (40 mg total) by mouth daily.  90 tablet  2  . Potassium 99 MG TABS Take 99 mg by mouth once.      . rosuvastatin (CRESTOR) 10 MG tablet Take 1 tablet (10 mg total) by mouth daily.  90 tablet  3  . sucralfate (CARAFATE) 1 G tablet Take 1 g by mouth 4 (four) times daily as needed.        No current facility-administered medications on file prior to visit.  Review of Systems  Patient denies any headache, lightheadedness or dizziness.  Headache has resolved.  Does report some head congestion and chest congestion.  No chest pain, tightness or palpitations. No increased sob.  Advair helps her breathing.  No increased acid reflux.  No abdominal pain or cramping.  No bowel change.  Some increased urinary frequency.  Being followed by pulmonary at [facility].          Objective:   Physical Exam  Filed Vitals:   09/30/13 0904  BP: 110/70  Pulse: 68  Temp: 97.8 F (68.72 C)   73 year old female in no acute distress.   HEENT:  Nares- clear.  Oropharynx - without lesions. NECK:  Supple.  Nontender.  No audible bruit.  HEART:  Appears to be regular. LUNGS:  No crackles or wheezing audible.  Respirations even and unlabored.  RADIAL PULSE:  Equal bilaterally.    BREASTS:  No nipple discharge or nipple retraction present.  Could not appreciate any distinct nodules or axillary adenopathy.  ABDOMEN:  Soft, nontender.   Bowel sounds present and normal.  No audible abdominal bruit.  GU:  Not performed.    EXTREMITIES:  No increased edema present.  DP pulses palpable and equal bilaterally.          Assessment & Plan:  CARDIOVASCULAR.  Currently stable.  Follow.    DERMATOLOGY.  Continue follow up with Dr Verner Chol.   HEALTH MAINTENANCE.  Physical today.  S/p hysterectomy.  Mammogram 10/01/12 - Birads II. Colonoscopy 6/11 as outlined.  Due follow up colonoscopy 2016.   I spent 25 minutes with the patient and more than 50% of the time was spent in consultation regarding the above.

## 2013-10-05 NOTE — Assessment & Plan Note (Signed)
EGD revealed hiatal hernia, nonobstructing Schatzki's ring, gastric polyp and gastritis.  On Nexium.  Follow.  Would recommend avoiding NSAIDS.

## 2013-10-05 NOTE — Assessment & Plan Note (Signed)
Blood pressure doing well.  On metoprolol.  Follow.

## 2013-10-05 NOTE — Assessment & Plan Note (Signed)
Increased urinary frequency.  Check urinalysis and culture to confirm no infection.

## 2013-10-05 NOTE — Assessment & Plan Note (Signed)
On crestor.  Low cholesterol diet and exercise.  Follow lipid panel and liver function.      

## 2013-10-05 NOTE — Assessment & Plan Note (Signed)
Colonoscopy 6/11 revealed two small polyps and internal hemorrhoids.  Due follow up colonoscopy 2016.

## 2013-10-05 NOTE — Assessment & Plan Note (Addendum)
Followed by pulmonary at [facility].  On advair.  Treat current infection.  Follow closely.

## 2013-10-05 NOTE — Assessment & Plan Note (Signed)
Resolved

## 2013-10-05 NOTE — Assessment & Plan Note (Signed)
Sinusitis/URI.  Treat with biaxin as directed.  Saline nasal spray as outlined.  Mucinex/robitussin as directed.  Follow.

## 2013-10-07 ENCOUNTER — Other Ambulatory Visit: Payer: Self-pay | Admitting: Internal Medicine

## 2013-10-07 ENCOUNTER — Telehealth: Payer: Self-pay | Admitting: Internal Medicine

## 2013-10-07 ENCOUNTER — Other Ambulatory Visit: Payer: Self-pay | Admitting: *Deleted

## 2013-10-07 DIAGNOSIS — R350 Frequency of micturition: Secondary | ICD-10-CM

## 2013-10-07 NOTE — Telephone Encounter (Signed)
The patient is needing a prior authorization for her solifenacin (VESICARE) 5 MG tablet

## 2013-10-07 NOTE — Progress Notes (Signed)
Order placed for urinalysis and culture (f/u)

## 2013-10-08 NOTE — Telephone Encounter (Signed)
Vesicare PA initiated

## 2013-10-10 ENCOUNTER — Other Ambulatory Visit: Payer: Self-pay | Admitting: *Deleted

## 2013-10-10 ENCOUNTER — Telehealth: Payer: Self-pay | Admitting: Internal Medicine

## 2013-10-10 DIAGNOSIS — R350 Frequency of micturition: Secondary | ICD-10-CM | POA: Diagnosis not present

## 2013-10-10 LAB — URINALYSIS, ROUTINE W REFLEX MICROSCOPIC
Bilirubin Urine: NEGATIVE
Hgb urine dipstick: NEGATIVE
Ketones, ur: NEGATIVE
LEUKOCYTES UA: NEGATIVE
NITRITE: NEGATIVE
Total Protein, Urine: NEGATIVE
URINE GLUCOSE: NEGATIVE
UROBILINOGEN UA: 0.2 (ref 0.0–1.0)
pH: 5.5 (ref 5.0–8.0)

## 2013-10-10 NOTE — Telephone Encounter (Signed)
Patient stopped by the office to give kidney sample. Patient stated that Dr. Nicki Reaper needs to write a diagnosis on authorization form as to why the patient is taking Vesicare.

## 2013-10-13 ENCOUNTER — Other Ambulatory Visit: Payer: Self-pay | Admitting: *Deleted

## 2013-10-13 MED ORDER — AMOXICILLIN 875 MG PO TABS: 875.0000 mg | ORAL_TABLET | Freq: Two times a day (BID) | ORAL | Status: DC

## 2013-10-14 LAB — CULTURE, URINE COMPREHENSIVE: Colony Count: 50000

## 2013-10-14 NOTE — Telephone Encounter (Signed)
I received notification why the insurance company denied coverage of vesicare.  She has to have tried ditropan or detrol and had adverse reaction or contraindication to taking one of these.  Has she tried one of these previously?  If so, has she had problems with the medication?  If she has not tried one of these medications, she is going to have to try one first.  Just let me know .  Thanks.

## 2013-10-15 NOTE — Telephone Encounter (Signed)
Spoke with pt, unsure if she has used Ditropan, but she has used Detrol previously. No problems with it, but it was ineffective.

## 2013-10-15 NOTE — Telephone Encounter (Signed)
See 10/10/13 telephone note regarding denial and reasons.

## 2013-10-15 NOTE — Telephone Encounter (Signed)
I received a notification that Angela Cohen's insurance denied coverage for vesicare stating no documentation she has tried ditropan or detrol.  She called today stating she tried detrol and it was not effective.  How do we go about getting approval for this given that she has tried detrol.  If there are questions, see me tomorrow when I return to the office and I will show you the form they sent.  Just need to know what I need to do.  Thanks.    Dr Nicki Reaper

## 2013-10-16 NOTE — Telephone Encounter (Signed)
Needs to reinitiated auth request since patient has been on Detrol

## 2013-10-16 NOTE — Telephone Encounter (Signed)
Left appeal request in Dr.scott box for the Vesicare

## 2013-10-27 ENCOUNTER — Telehealth: Payer: Self-pay | Admitting: Internal Medicine

## 2013-10-27 MED ORDER — ROSUVASTATIN CALCIUM 10 MG PO TABS: 10.0000 mg | ORAL_TABLET | Freq: Every day | ORAL | Status: DC

## 2013-10-27 NOTE — Telephone Encounter (Signed)
Rx sent to pharmacy by escript  

## 2013-10-27 NOTE — Telephone Encounter (Signed)
Crestor 10 mg.  [facility].  Has enough for this week and then will be out.

## 2013-10-29 ENCOUNTER — Other Ambulatory Visit: Payer: Self-pay | Admitting: *Deleted

## 2013-10-29 MED ORDER — OXYBUTYNIN CHLORIDE ER 5 MG PO TB24: 5.0000 mg | ORAL_TABLET | Freq: Every day | ORAL | Status: DC

## 2013-10-29 NOTE — Telephone Encounter (Signed)
I spent 40 minutes on the phone with patients insurance & was informed that her [place] part [place] insurance requires her to have tried 2 alternatives before approving the Vesicare Per Dr. Derrel Nip, okay to switch to Oxybutynin ER 5mg . Rx sent to pharmacy & copy of form placed in your folder for signature.

## 2013-10-30 NOTE — Telephone Encounter (Signed)
Noted  

## 2013-11-07 DIAGNOSIS — J45909 Unspecified asthma, uncomplicated: Secondary | ICD-10-CM | POA: Diagnosis not present

## 2013-12-19 ENCOUNTER — Ambulatory Visit: Payer: Self-pay | Admitting: Internal Medicine

## 2013-12-19 DIAGNOSIS — Z1231 Encounter for screening mammogram for malignant neoplasm of breast: Secondary | ICD-10-CM | POA: Diagnosis not present

## 2013-12-19 LAB — HM MAMMOGRAPHY: HM MAMMO: NEGATIVE

## 2013-12-24 ENCOUNTER — Encounter: Payer: Self-pay | Admitting: *Deleted

## 2013-12-24 DIAGNOSIS — H2510 Age-related nuclear cataract, unspecified eye: Secondary | ICD-10-CM | POA: Diagnosis not present

## 2013-12-24 DIAGNOSIS — H35389 Toxic maculopathy, unspecified eye: Secondary | ICD-10-CM | POA: Diagnosis not present

## 2014-01-12 ENCOUNTER — Encounter: Payer: Self-pay | Admitting: Internal Medicine

## 2014-02-02 ENCOUNTER — Ambulatory Visit: Payer: Medicare Other | Admitting: Internal Medicine

## 2014-03-12 DIAGNOSIS — Z23 Encounter for immunization: Secondary | ICD-10-CM | POA: Diagnosis not present

## 2014-03-20 ENCOUNTER — Ambulatory Visit (INDEPENDENT_AMBULATORY_CARE_PROVIDER_SITE_OTHER): Payer: Medicare Other | Admitting: Internal Medicine

## 2014-03-20 ENCOUNTER — Encounter: Payer: Self-pay | Admitting: Internal Medicine

## 2014-03-20 VITALS — BP 110/70 | HR 72 | Temp 98.1°F | Ht 64.5 in | Wt 185.5 lb

## 2014-03-20 DIAGNOSIS — R519 Headache, unspecified: Secondary | ICD-10-CM

## 2014-03-20 DIAGNOSIS — R51 Headache: Secondary | ICD-10-CM

## 2014-03-20 DIAGNOSIS — G4489 Other headache syndrome: Secondary | ICD-10-CM

## 2014-03-20 DIAGNOSIS — Z86010 Personal history of colonic polyps: Secondary | ICD-10-CM

## 2014-03-20 DIAGNOSIS — I10 Essential (primary) hypertension: Secondary | ICD-10-CM

## 2014-03-20 DIAGNOSIS — E7800 Pure hypercholesterolemia, unspecified: Secondary | ICD-10-CM

## 2014-03-20 DIAGNOSIS — K219 Gastro-esophageal reflux disease without esophagitis: Secondary | ICD-10-CM

## 2014-03-20 DIAGNOSIS — R1310 Dysphagia, unspecified: Secondary | ICD-10-CM

## 2014-03-20 DIAGNOSIS — R140 Abdominal distension (gaseous): Secondary | ICD-10-CM | POA: Diagnosis not present

## 2014-03-20 DIAGNOSIS — A310 Pulmonary mycobacterial infection: Secondary | ICD-10-CM | POA: Diagnosis not present

## 2014-03-20 DIAGNOSIS — R5382 Chronic fatigue, unspecified: Secondary | ICD-10-CM

## 2014-03-20 DIAGNOSIS — M542 Cervicalgia: Secondary | ICD-10-CM

## 2014-03-20 LAB — CBC WITH DIFFERENTIAL/PLATELET
BASOS ABS: 0.1 10*3/uL (ref 0.0–0.1)
BASOS PCT: 1 % (ref 0–1)
EOS ABS: 0.3 10*3/uL (ref 0.0–0.7)
Eosinophils Relative: 4 % (ref 0–5)
HCT: 41.6 % (ref 36.0–46.0)
Hemoglobin: 14 g/dL (ref 12.0–15.0)
LYMPHS ABS: 2.2 10*3/uL (ref 0.7–4.0)
Lymphocytes Relative: 34 % (ref 12–46)
MCH: 31.7 pg (ref 26.0–34.0)
MCHC: 33.7 g/dL (ref 30.0–36.0)
MCV: 94.1 fL (ref 78.0–100.0)
Monocytes Absolute: 0.5 10*3/uL (ref 0.1–1.0)
Monocytes Relative: 8 % (ref 3–12)
NEUTROS PCT: 53 % (ref 43–77)
Neutro Abs: 3.4 10*3/uL (ref 1.7–7.7)
PLATELETS: 248 10*3/uL (ref 150–400)
RBC: 4.42 MIL/uL (ref 3.87–5.11)
RDW: 13.2 % (ref 11.5–15.5)
WBC: 6.5 10*3/uL (ref 4.0–10.5)

## 2014-03-20 MED ORDER — ROSUVASTATIN CALCIUM 10 MG PO TABS: 10.0000 mg | ORAL_TABLET | Freq: Every day | ORAL | Status: DC

## 2014-03-20 MED ORDER — ESOMEPRAZOLE MAGNESIUM 40 MG PO CPDR: 40.0000 mg | DELAYED_RELEASE_CAPSULE | Freq: Two times a day (BID) | ORAL | Status: DC

## 2014-03-20 NOTE — Progress Notes (Signed)
Pre visit review using our clinic review tool, if applicable. No additional management support is needed unless otherwise documented below in the visit note. 

## 2014-03-21 ENCOUNTER — Other Ambulatory Visit: Payer: Self-pay | Admitting: Internal Medicine

## 2014-03-21 DIAGNOSIS — N289 Disorder of kidney and ureter, unspecified: Secondary | ICD-10-CM

## 2014-03-21 LAB — HEPATIC FUNCTION PANEL
ALK PHOS: 70 U/L (ref 39–117)
ALT: 15 U/L (ref 0–35)
AST: 17 U/L (ref 0–37)
Albumin: 4.3 g/dL (ref 3.5–5.2)
Bilirubin, Direct: 0.1 mg/dL (ref 0.0–0.3)
Indirect Bilirubin: 0.2 mg/dL (ref 0.2–1.2)
TOTAL PROTEIN: 7.3 g/dL (ref 6.0–8.3)
Total Bilirubin: 0.3 mg/dL (ref 0.2–1.2)

## 2014-03-21 LAB — BASIC METABOLIC PANEL
BUN: 15 mg/dL (ref 6–23)
CHLORIDE: 100 meq/L (ref 96–112)
CO2: 28 mEq/L (ref 19–32)
CREATININE: 1.25 mg/dL — AB (ref 0.50–1.10)
Calcium: 9.1 mg/dL (ref 8.4–10.5)
Glucose, Bld: 73 mg/dL (ref 70–99)
Potassium: 4.6 mEq/L (ref 3.5–5.3)
SODIUM: 141 meq/L (ref 135–145)

## 2014-03-21 LAB — SEDIMENTATION RATE: Sed Rate: 9 mm/hr (ref 0–22)

## 2014-03-21 LAB — TSH: TSH: 2.245 u[IU]/mL (ref 0.350–4.500)

## 2014-03-21 NOTE — Progress Notes (Signed)
Order placed for f/u met b.  

## 2014-03-23 ENCOUNTER — Telehealth: Payer: Self-pay | Admitting: *Deleted

## 2014-03-23 ENCOUNTER — Telehealth: Payer: Self-pay | Admitting: Internal Medicine

## 2014-03-23 ENCOUNTER — Encounter: Payer: Self-pay | Admitting: Internal Medicine

## 2014-03-23 DIAGNOSIS — R1310 Dysphagia, unspecified: Secondary | ICD-10-CM | POA: Insufficient documentation

## 2014-03-23 DIAGNOSIS — K219 Gastro-esophageal reflux disease without esophagitis: Secondary | ICD-10-CM | POA: Insufficient documentation

## 2014-03-23 MED ORDER — ESOMEPRAZOLE MAGNESIUM 40 MG PO CPDR
40.0000 mg | DELAYED_RELEASE_CAPSULE | Freq: Two times a day (BID) | ORAL | Status: DC
Start: 2014-03-23 — End: 2014-10-14

## 2014-03-23 NOTE — Telephone Encounter (Signed)
PA initiated for Nexium. Ref# [id number]. Per company, should receive a decision via fax in 24 hours.

## 2014-03-23 NOTE — Progress Notes (Signed)
Subjective:    [name], female    DOB: Mar 04, 1941, 73 y.o.   MRN: [id number]  Headache   73 year old female with past history of osteoarthritis, reflux esophagitis, hyperlipidemia, MAI and FCD who comes in today for a scheduled follow up.  She is being followed by pulmonary at [facility].   Has asthma.  On advair.  Has had mycobacterium avium isolated from a bronchoscopy.  Her breathing has been stable.  No sob.  No chest pain or tightness.  She does report increased acid reflux.  Also having problems with food not going down.  Difficulty swallowing.  Better at times.   No nausea.  States sometimes the food will "come back up because it won't go down".   No bowel change.  She called Dr Dillard Essex office.  has an appt at the end of the month.  Feels the current PPI not working.  States nexium worked better for her.  She also is having persistent headaches.  States have been present for one year.  States feels like a band around her head.  Some dizziness.  No change.  Just persistent.  Has disc issues in her neck.  Saw neurosurgery.  No further w/up warranted.  Feel neck may be contributing some to her headaches.  States unable to tolerate muscle relaxer.  Made her sick.       Past Medical History  Diagnosis Date  . Osteoporosis   . Fibrocystic breast disease   . Reflux esophagitis   . Hypercholesterolemia   . MAI (mycobacterium avium-intracellulare) infection   . Hypertension   . History of colon polyps   . Gastritis     EGD - schatzki's ring, gastritis and gastric polyps    Current Outpatient Prescriptions on File Prior to Visit  Medication Sig Dispense Refill  . aspirin (ASPIRIN EC) 81 MG EC tablet Take 81 mg by mouth daily. Swallow whole.    . fish oil-omega-3 fatty acids 1000 MG capsule Take 1,000 mg by mouth daily. Take 1 capsule by mouth once a day    . metoprolol succinate (TOPROL-XL) 25 MG 24 hr tablet Take 1 tablet (25 mg total) by mouth 2 (two) times daily. MUST KEEP  APPOINTMENT ON 5/19 FOR FURTHER REFILLS 180 tablet 0  . Multiple Vitamin (MULTIVITAMIN WITH MINERALS) TABS Take 1 tablet by mouth daily.     No current facility-administered medications on file prior to visit.    Review of Systems  Neurological: Positive for headaches.  Persistent headache as outlined.  Dizziness as outlined.  No vision change.  No sinus congestion reported.   No chest pain, tightness or palpitations. No increased sob.  Advair helps her breathing.  Acid reflux as outlined.  Swallowing issues as outlined.  Feels like food getting stuck.  Occasional emesis.  No significant abdominal pain.  Being followed by pulmonary at [facility].          Objective:   Physical Exam  Filed Vitals:   03/20/14 1552  BP: 110/70  Pulse: 72  Temp: 98.1 F (18.30 C)   73 year old female in no acute distress.   HEENT:  Nares- clear.  Oropharynx - without lesions. NECK:  Supple.  Nontender.  No audible bruit.  HEART:  Appears to be regular. LUNGS:  No crackles or wheezing audible.  Respirations even and unlabored.  RADIAL PULSE:  Equal bilaterally.   ABDOMEN:  Soft, nontender.  Bowel sounds present and normal.  No audible abdominal bruit.  EXTREMITIES:  No increased edema present.  DP pulses palpable and equal bilaterally.          Assessment & Plan:  CARDIOVASCULAR.  Currently stable.  Follow.    DERMATOLOGY.  Continue follow up with Dr Verner Chol.   Other headache syndrome Persistent headache.  Present for one year.  Has had MRI previously.  Recently had opthalmology exam.  Associated dizziness.  Discussed the possibility of her neck issues contributing.  Did not tolerated muscle relaxer.  Refer to neurology for further evaluation.   - Sedimentation rate - CBC with Differential  Sensation of gaseous abdominal fullness Persistent acid reflux and feeling as if food is getting stuck.  Refer to GI.  See if can get earlier appt.  Change back to nexium. - Hepatic function panel - Basic  metabolic panel  Chronic fatigue - TSH  Essential hypertension, benign Blood pressure doing well.  Follow.   MAI (mycobacterium avium-intracellulare) infection Followed by pulmonary.    Hypercholesterolemia Low cholesterol diet.  Continue crestor.  Lab Results  Component Value Date   CHOL 149 09/30/2013   HDL 65.20 09/30/2013   LDLCALC 63 09/30/2013   TRIG 105.0 09/30/2013   CHOLHDL 2 09/30/2013   History of colonic polyps Colonoscopy 10/2009 revealed two small polyps and internal hemorrhoids. Due f/u colonoscopy in 2016.   Neck pain Had MRI c-spine.  Saw [facility].  Felt no further w/up warranted.  See above.   Nonintractable headache, unspecified chronicity pattern, unspecified headache type See above.  Check esr.  Refer to neurology.  Unchanged.   Dysphagia Described worsening acid reflux and dysphagia.  Change to nexium.  Refer to GI.    Gastroesophageal reflux disease, esophagitis presence not specified Change to nexium.  Refer to GI.  See above.    HEALTH MAINTENANCE.  Physical 09/30/13.  S/p hysterectomy.  Mammogram 12/19/13 - Birads I. Colonoscopy 6/11 as outlined.  Due follow up colonoscopy 2016.   I spent 25 minutes with the patient and more than 50% of the time was spent in consultation regarding the above.

## 2014-03-23 NOTE — Telephone Encounter (Signed)
Pt aware & ref number given to patient also

## 2014-03-23 NOTE — Telephone Encounter (Signed)
Left message for pt to return my call.

## 2014-03-23 NOTE — Telephone Encounter (Signed)
Spoke to patient, states she talked to [employer] and her Nexium was approved. She just needs Rx sent to [facility]. Rx sent to pharmacy by escript

## 2014-03-23 NOTE — Telephone Encounter (Signed)
Received fax from [place]: Nexium has been approved-Effective: 12/23/2013-03/23/2015.

## 2014-03-23 NOTE — Telephone Encounter (Signed)
Ms. Angela Cohen called saying she needs Nexium. She said the Rx that was sent to her pharmacy was the off brand kind and it had "no substitute" listed. Per Ms. Angela Cohen, the off brand does nothing for her and she needs to take Nexium only. She's wondering if Dr. Nicki Reaper can send another Rx request to [facility] for Nexium only and have "No Substitute" written on it. She needs a 2 pill a day three month supply. The fax number to [facility] is: [phone]. The pt requested a phone call once the refill is sent. Pt ph# [phone] Thank you.

## 2014-03-27 ENCOUNTER — Telehealth: Payer: Self-pay | Admitting: Internal Medicine

## 2014-03-27 ENCOUNTER — Other Ambulatory Visit (INDEPENDENT_AMBULATORY_CARE_PROVIDER_SITE_OTHER): Payer: Medicare Other

## 2014-03-27 DIAGNOSIS — Z79899 Other long term (current) drug therapy: Secondary | ICD-10-CM | POA: Diagnosis not present

## 2014-03-27 DIAGNOSIS — J45909 Unspecified asthma, uncomplicated: Secondary | ICD-10-CM | POA: Diagnosis not present

## 2014-03-27 DIAGNOSIS — N289 Disorder of kidney and ureter, unspecified: Secondary | ICD-10-CM

## 2014-03-27 DIAGNOSIS — R0789 Other chest pain: Secondary | ICD-10-CM | POA: Diagnosis not present

## 2014-03-27 DIAGNOSIS — A310 Pulmonary mycobacterial infection: Secondary | ICD-10-CM | POA: Diagnosis not present

## 2014-03-27 DIAGNOSIS — E785 Hyperlipidemia, unspecified: Secondary | ICD-10-CM | POA: Diagnosis not present

## 2014-03-27 DIAGNOSIS — Z7951 Long term (current) use of inhaled steroids: Secondary | ICD-10-CM | POA: Diagnosis not present

## 2014-03-27 DIAGNOSIS — Z9119 Patient's noncompliance with other medical treatment and regimen: Secondary | ICD-10-CM | POA: Diagnosis not present

## 2014-03-27 DIAGNOSIS — Z7982 Long term (current) use of aspirin: Secondary | ICD-10-CM | POA: Diagnosis not present

## 2014-03-27 DIAGNOSIS — M069 Rheumatoid arthritis, unspecified: Secondary | ICD-10-CM | POA: Diagnosis not present

## 2014-03-27 DIAGNOSIS — A319 Mycobacterial infection, unspecified: Secondary | ICD-10-CM | POA: Diagnosis not present

## 2014-03-27 NOTE — Telephone Encounter (Signed)
Contacted pt & notified her that she needs to schedule an appt (walk-in not accepted for immunizations). And she needs to be sure that her swallowing issues & headaches have resolved prior to getting an injection.

## 2014-03-27 NOTE — Telephone Encounter (Signed)
Pt request to have pneumonia shot. Please advise if it is ok to schedule.msn

## 2014-03-27 NOTE — Telephone Encounter (Signed)
Please advise, pt in waiting room

## 2014-03-28 LAB — BASIC METABOLIC PANEL
BUN: 13 mg/dL (ref 6–23)
CO2: 25 mEq/L (ref 19–32)
CREATININE: 1.05 mg/dL (ref 0.50–1.10)
Calcium: 8.7 mg/dL (ref 8.4–10.5)
Chloride: 103 mEq/L (ref 96–112)
GLUCOSE: 97 mg/dL (ref 70–99)
POTASSIUM: 4.2 meq/L (ref 3.5–5.3)
Sodium: 140 mEq/L (ref 135–145)

## 2014-04-08 DIAGNOSIS — R51 Headache: Secondary | ICD-10-CM | POA: Diagnosis not present

## 2014-04-08 DIAGNOSIS — G4440 Drug-induced headache, not elsewhere classified, not intractable: Secondary | ICD-10-CM | POA: Diagnosis not present

## 2014-04-23 ENCOUNTER — Ambulatory Visit: Payer: Self-pay | Admitting: Neurology

## 2014-04-23 DIAGNOSIS — G9389 Other specified disorders of brain: Secondary | ICD-10-CM | POA: Diagnosis not present

## 2014-04-23 DIAGNOSIS — J341 Cyst and mucocele of nose and nasal sinus: Secondary | ICD-10-CM | POA: Diagnosis not present

## 2014-04-23 DIAGNOSIS — I998 Other disorder of circulatory system: Secondary | ICD-10-CM | POA: Diagnosis not present

## 2014-04-28 DIAGNOSIS — M5481 Occipital neuralgia: Secondary | ICD-10-CM | POA: Diagnosis not present

## 2014-05-01 DIAGNOSIS — J040 Acute laryngitis: Secondary | ICD-10-CM | POA: Diagnosis not present

## 2014-05-01 DIAGNOSIS — J069 Acute upper respiratory infection, unspecified: Secondary | ICD-10-CM | POA: Diagnosis not present

## 2014-05-28 ENCOUNTER — Telehealth: Payer: Self-pay | Admitting: Internal Medicine

## 2014-05-28 NOTE — Telephone Encounter (Signed)
Pt was called to setup a physical appt after 10/01/14. Pt stated that she would call back to schedule appt.

## 2014-06-12 ENCOUNTER — Encounter: Payer: Self-pay | Admitting: Internal Medicine

## 2014-09-07 DIAGNOSIS — Z86010 Personal history of colonic polyps: Secondary | ICD-10-CM | POA: Diagnosis not present

## 2014-09-07 DIAGNOSIS — R1310 Dysphagia, unspecified: Secondary | ICD-10-CM | POA: Diagnosis not present

## 2014-09-07 DIAGNOSIS — K219 Gastro-esophageal reflux disease without esophagitis: Secondary | ICD-10-CM | POA: Diagnosis not present

## 2014-09-28 ENCOUNTER — Encounter: Payer: Self-pay | Admitting: *Deleted

## 2014-09-28 ENCOUNTER — Ambulatory Visit: Payer: Medicare Other | Admitting: Anesthesiology

## 2014-09-28 ENCOUNTER — Encounter
Admission: RE | Disposition: A | Discharge status: Home / Self Care | Payer: Self-pay | Source: Ambulatory Visit | Attending: Unknown Physician Specialty

## 2014-09-28 ENCOUNTER — Ambulatory Visit
Admission: RE | Admit: 2014-09-28 | Discharge: 2014-09-28 | Disposition: A | Discharge status: Home / Self Care | Payer: Medicare Other | Source: Ambulatory Visit | Attending: Unknown Physician Specialty | Admitting: Unknown Physician Specialty

## 2014-09-28 DIAGNOSIS — Z7982 Long term (current) use of aspirin: Secondary | ICD-10-CM | POA: Diagnosis not present

## 2014-09-28 DIAGNOSIS — E780 Pure hypercholesterolemia: Secondary | ICD-10-CM | POA: Insufficient documentation

## 2014-09-28 DIAGNOSIS — K210 Gastro-esophageal reflux disease with esophagitis: Secondary | ICD-10-CM | POA: Insufficient documentation

## 2014-09-28 DIAGNOSIS — J45909 Unspecified asthma, uncomplicated: Secondary | ICD-10-CM | POA: Diagnosis not present

## 2014-09-28 DIAGNOSIS — K222 Esophageal obstruction: Secondary | ICD-10-CM | POA: Diagnosis not present

## 2014-09-28 DIAGNOSIS — K2960 Other gastritis without bleeding: Secondary | ICD-10-CM | POA: Diagnosis not present

## 2014-09-28 DIAGNOSIS — Z79899 Other long term (current) drug therapy: Secondary | ICD-10-CM | POA: Insufficient documentation

## 2014-09-28 DIAGNOSIS — Z7951 Long term (current) use of inhaled steroids: Secondary | ICD-10-CM | POA: Insufficient documentation

## 2014-09-28 DIAGNOSIS — K648 Other hemorrhoids: Secondary | ICD-10-CM | POA: Insufficient documentation

## 2014-09-28 DIAGNOSIS — Z86010 Personal history of colonic polyps: Secondary | ICD-10-CM | POA: Insufficient documentation

## 2014-09-28 DIAGNOSIS — R1310 Dysphagia, unspecified: Secondary | ICD-10-CM | POA: Insufficient documentation

## 2014-09-28 DIAGNOSIS — I10 Essential (primary) hypertension: Secondary | ICD-10-CM | POA: Insufficient documentation

## 2014-09-28 DIAGNOSIS — K2950 Unspecified chronic gastritis without bleeding: Secondary | ICD-10-CM | POA: Insufficient documentation

## 2014-09-28 DIAGNOSIS — Z1211 Encounter for screening for malignant neoplasm of colon: Secondary | ICD-10-CM | POA: Diagnosis not present

## 2014-09-28 DIAGNOSIS — Z791 Long term (current) use of non-steroidal anti-inflammatories (NSAID): Secondary | ICD-10-CM | POA: Diagnosis not present

## 2014-09-28 DIAGNOSIS — K640 First degree hemorrhoids: Secondary | ICD-10-CM | POA: Diagnosis not present

## 2014-09-28 HISTORY — PX: ESOPHAGOGASTRODUODENOSCOPY: SHX5428

## 2014-09-28 HISTORY — PX: COLONOSCOPY: SHX5424

## 2014-09-28 LAB — HM COLONOSCOPY

## 2014-09-28 SURGERY — COLONOSCOPY
Anesthesia: General

## 2014-09-28 MED ORDER — GLYCOPYRROLATE 0.2 MG/ML IJ SOLN
INTRAMUSCULAR | Status: DC | PRN
  Administered 2014-09-28: 0.2 mg via INTRAVENOUS

## 2014-09-28 MED ORDER — SODIUM CHLORIDE 0.9 % IV SOLN
INTRAVENOUS | Status: DC
  Administered 2014-09-28: 09:00:00 via INTRAVENOUS

## 2014-09-28 MED ORDER — METOPROLOL TARTRATE 25 MG PO TABS: 25.0000 mg | ORAL_TABLET | Freq: Once | ORAL | Status: DC

## 2014-09-28 MED ORDER — PROPOFOL 10 MG/ML IV BOLUS
INTRAVENOUS | Status: DC | PRN
  Administered 2014-09-28: 100 mg via INTRAVENOUS

## 2014-09-28 MED ORDER — SODIUM CHLORIDE 0.9 % IV SOLN: INTRAVENOUS | Status: DC

## 2014-09-28 MED ORDER — METOPROLOL SUCCINATE ER 25 MG PO TB24
25.0000 mg | ORAL_TABLET | Freq: Once | ORAL | Status: AC
  Administered 2014-09-28: 25 mg via ORAL

## 2014-09-28 MED ORDER — METOPROLOL SUCCINATE ER 25 MG PO TB24: 25.0000 mg | ORAL_TABLET | Freq: Once | ORAL | Status: DC

## 2014-09-28 MED ORDER — PROPOFOL INFUSION 10 MG/ML OPTIME
INTRAVENOUS | Status: DC | PRN
  Administered 2014-09-28: 120 ug/kg/min via INTRAVENOUS

## 2014-09-28 NOTE — Anesthesia Postprocedure Evaluation (Signed)
  Anesthesia Post-op Note  Patient: Angela Cohen  Procedure(s) Performed: Procedure(s): COLONOSCOPY (N/A) ESOPHAGOGASTRODUODENOSCOPY (EGD) (N/A)  Anesthesia type:General  Patient location: PACU  Post pain: Pain level controlled  Post assessment: Post-op Vital signs reviewed, Patient's Cardiovascular Status Stable, Respiratory Function Stable, Patent Airway and No signs of Nausea or vomiting  Post vital signs: Reviewed and stable  Last Vitals:  Filed Vitals:   09/28/14 1100  BP: 136/77  Pulse: 68  Temp:   Resp: 14    Level of consciousness: awake, alert  and patient cooperative  Complications: No apparent anesthesia complications

## 2014-09-28 NOTE — H&P (Signed)
Primary Care Physician:  Einar Pheasant, MD Primary Gastroenterologist:  Dr. Vira Agar  Pre-Procedure History & Physical: HPI:  Angela Cohen is a 74 y.o. female is here for an endoscopy and colonoscopy.   Past Medical History  Diagnosis Date  . Osteoporosis   . Fibrocystic breast disease   . Reflux esophagitis   . Hypercholesterolemia   . MAI (mycobacterium avium-intracellulare) infection   . Hypertension   . History of colon polyps   . Gastritis     EGD - schatzki's ring, gastritis and gastric polyps    Past Surgical History  Procedure Laterality Date  . Abdominal hysterectomy  1998  . Hemorrhoid surgery    . Breast surgery      cyst(benign)  . Nasal septum surgery    . Foot surgery Right   . Hernia repair  10/07  . Excision of right axillary lipoma  10/07    Prior to Admission medications   Medication Sig Start Date End Date Taking? Authorizing Provider  albuterol (PROVENTIL HFA;VENTOLIN HFA) 108 (90 BASE) MCG/ACT inhaler Inhale 2 puffs into the lungs every 6 (six) hours as needed. 03/27/14 03/28/15 Yes Historical Provider, MD  aspirin (ASPIRIN EC) 81 MG EC tablet Take 81 mg by mouth daily. Swallow whole.   Yes Historical Provider, MD  esomeprazole (NEXIUM) 40 MG capsule Take 1 capsule (40 mg total) by mouth 2 (two) times daily before a meal. 03/23/14  Yes Einar Pheasant, MD  Fluticasone-Salmeterol (ADVAIR) 250-50 MCG/DOSE AEPB Inhale 2 puffs into the lungs 2 (two) times daily.   Yes Historical Provider, MD  metoprolol succinate (TOPROL-XL) 25 MG 24 hr tablet Take 1 tablet (25 mg total) by mouth 2 (two) times daily. MUST KEEP APPOINTMENT ON 5/19 FOR FURTHER REFILLS Patient taking differently: Take 25 mg by mouth daily. MUST KEEP APPOINTMENT ON 5/19 FOR FURTHER REFILLS 08/04/13  Yes Einar Pheasant, MD  Multiple Vitamin (MULTIVITAMIN WITH MINERALS) TABS Take 1 tablet by mouth daily.   Yes Historical Provider, MD  naproxen sodium (ANAPROX) 220 MG tablet Take 220 mg by mouth  2 (two) times daily as needed (pain).   Yes Historical Provider, MD  Omega-3 Fatty Acids (FISH OIL) 1000 MG CAPS Take 3,000 mg by mouth daily. 12/29/08  Yes Historical Provider, MD  rosuvastatin (CRESTOR) 10 MG tablet Take 1 tablet (10 mg total) by mouth daily. 03/20/14  Yes Einar Pheasant, MD    Allergies as of 09/08/2014  . (No Known Allergies)    Family History  Problem Relation Age of Onset  . Heart disease Mother 77    heart attack  . Cancer Father     lung  . Stroke Brother   . Diabetes Brother   . Cancer Sister     breast cancer  . Diabetes Sister     History   Social History  . Marital Status: Widowed    Spouse Name: N/A  . Number of Children: N/A  . Years of Education: N/A   Occupational History  . Not on file.   Social History Main Topics  . Smoking status: Never Smoker   . Smokeless tobacco: Never Used  . Alcohol Use: No  . Drug Use: No  . Sexual Activity: Not on file   Other Topics Concern  . Not on file   Social History Narrative    Review of Systems: See HPI, otherwise negative ROS  Physical Exam: BP 148/76 mmHg  Pulse 92  Temp(Src) 97.5 F (36.4 C) (Tympanic)  Resp 16  Ht 5\' 4"  (1.626 m)  Wt 72.576 kg (160 lb)  BMI 27.45 kg/m2  SpO2 99% General:   Alert,  pleasant and cooperative in NAD Head:  Normocephalic and atraumatic. Neck:  Supple; no masses or thyromegaly. Lungs:  Clear throughout to auscultation.    Heart:  Regular rate and rhythm. Abdomen:  Soft, nontender and nondistended. Normal bowel sounds, without guarding, and without rebound.   Neurologic:  Alert and  oriented x4;  grossly normal neurologically.  Impression/Plan: Angela Cohen is here for an endoscopy and colonoscopy to be performed for personal history of colon polyps and dysphagia.  Risks, benefits, limitations, and alternatives regarding  endoscopy and colonoscopy have been reviewed with the patient.  Questions have been answered.  All parties  agreeable.   Gaylyn Cheers, MD  09/28/2014, 9:37 AM

## 2014-09-28 NOTE — Transfer of Care (Signed)
Immediate Anesthesia Transfer of Care Note  Patient: Angela Cohen  Procedure(s) Performed: Procedure(s): COLONOSCOPY (N/A) ESOPHAGOGASTRODUODENOSCOPY (EGD) (N/A)  Patient Location: PACU  Anesthesia Type:General  Level of Consciousness: awake and patient cooperative  Airway & Oxygen Therapy: Patient Spontanous Breathing and Patient connected to nasal cannula oxygen  Post-op Assessment: Report given to RN and Post -op Vital signs reviewed and stable  Post vital signs: Reviewed and stable  Last Vitals:  Filed Vitals:   09/28/14 0832  BP: 148/76  Pulse: 92  Temp: 36.4 C  Resp: 16    Complications: No apparent anesthesia complications

## 2014-09-28 NOTE — Anesthesia Preprocedure Evaluation (Signed)
Anesthesia Evaluation  Patient identified by MRN, date of birth, ID band Patient awake    Reviewed: Allergy & Precautions, NPO status , Patient's Chart, lab work & pertinent test results  Airway Mallampati: II  TM Distance: >3 FB Neck ROM: Full    Dental  (+) Partial Lower, Partial Upper   Pulmonary asthma (lst inhaler 1 week ago) ,          Cardiovascular Pt. on home beta blockers + dysrhythmias (unknown dysrhymia)     Neuro/Psych    GI/Hepatic GERD-  Medicated and Poorly Controlled,  Endo/Other    Renal/GU      Musculoskeletal   Abdominal   Peds  Hematology   Anesthesia Other Findings   Reproductive/Obstetrics                             Anesthesia Physical Anesthesia Plan  ASA: III  Anesthesia Plan: General   Post-op Pain Management:    Induction: Intravenous  Airway Management Planned: Nasal Cannula  Additional Equipment:   Intra-op Plan:   Post-operative Plan:   Informed Consent: I have reviewed the patients History and Physical, chart, labs and discussed the procedure including the risks, benefits and alternatives for the proposed anesthesia with the patient or authorized representative who has indicated his/her understanding and acceptance.     Plan Discussed with:   Anesthesia Plan Comments:         Anesthesia Quick Evaluation

## 2014-09-28 NOTE — Op Note (Signed)
[facility] Gastroenterology Patient Name: Angela Cohen Procedure Date: 09/28/2014 9:31 AM MRN: [id number] Account #: [id number] Date of Birth: 01/11/1941 Admit Type: Outpatient Age: 74 Room: [facility] ENDO ROOM 1 Gender: Female Note Status: Finalized Procedure:         Colonoscopy Indications:       High risk colon cancer surveillance: Personal history of                     colonic polyps Providers:         Manya Silvas, MD Referring MD:      Einar Pheasant, MD (Referring MD) Medicines:         Propofol per Anesthesia Complications:     No immediate complications. Procedure:         Pre-Anesthesia Assessment:                    - After reviewing the risks and benefits, the patient was                     deemed in satisfactory condition to undergo the procedure.                    After obtaining informed consent, the colonoscope was                     passed under direct vision. Throughout the procedure, the                     patient's blood pressure, pulse, and oxygen saturations                     were monitored continuously. The Colonoscope was                     introduced through the anus and advanced to the the cecum,                     identified by appendiceal orifice and ileocecal valve. The                     colonoscopy was somewhat difficult due to restricted                     mobility of the colon. Successful completion of the                     procedure was aided by applying abdominal pressure. The                     patient tolerated the procedure well. Findings:      Internal hemorrhoids were found during endoscopy. The hemorrhoids were       small and Grade I (internal hemorrhoids that do not prolapse).      The sigmoid colon was quite difficult and required locking R & L control       and abd pressure.      The exam was otherwise without abnormality. Impression:        - Internal hemorrhoids.                    - The examination  was otherwise normal.                    - No specimens collected.  Recommendation:    - Repeat colonoscopy in 5 years for surveillance. Manya Silvas, MD 09/28/2014 10:37:18 AM This report has been signed electronically. Number of Addenda: 0 Note Initiated On: 09/28/2014 9:31 AM Scope Withdrawal Time: 0 hours 6 minutes 7 seconds  Total Procedure Duration: 0 hours 21 minutes 5 seconds       [facility]

## 2014-09-28 NOTE — Op Note (Signed)
[facility] Gastroenterology Patient Name: Angela Cohen Procedure Date: 09/28/2014 9:32 AM MRN: [id number] Account #: [id number] Date of Birth: Oct 21, 1940 Admit Type: Outpatient Age: 74 Room: [facility] ENDO ROOM 1 Gender: Female Note Status: Finalized Procedure:         Upper GI endoscopy Indications:       Dysphagia Providers:         Manya Silvas, MD Referring MD:      Einar Pheasant, MD (Referring MD) Complications:     No immediate complications. Procedure:         Pre-Anesthesia Assessment:                    - After reviewing the risks and benefits, the patient was                     deemed in satisfactory condition to undergo the procedure.                    After obtaining informed consent, the endoscope was passed                     under direct vision. Throughout the procedure, the                     patient's blood pressure, pulse, and oxygen saturations                     were monitored continuously. The Endoscope was introduced                     through the mouth, and advanced to the second part of                     duodenum. The upper GI endoscopy was accomplished without                     difficulty. The patient tolerated the procedure well. Findings:      A mild Schatzki ring (acquired) was found at the gastroesophageal       junction. A guidewire was placed and the scope was withdrawn. Dilation       was performed with a Savary dilator with mild resistance at 16 mm.      Patchy mild inflammation characterized by erythema and granularity was       found in the gastric antrum. Biopsies were taken with a cold forceps for       histology. Biopsies were taken with a cold forceps for Helicobacter       pylori testing.      The examined duodenum was normal. Impression:        - Mild Schatzki ring. Dilated.                    - Gastritis. Biopsied.                    - Normal examined duodenum. Recommendation:    - Await pathology results.                    - soft food for 3 days, eat slowly, chew well, take small                     bites Manya Silvas, MD 09/28/2014 10:10:44 AM This report has been signed  electronically. Number of Addenda: 0 Note Initiated On: 09/28/2014 9:32 AM      [facility]

## 2014-09-29 ENCOUNTER — Encounter: Payer: Self-pay | Admitting: Unknown Physician Specialty

## 2014-09-29 LAB — SURGICAL PATHOLOGY

## 2014-10-14 ENCOUNTER — Ambulatory Visit (INDEPENDENT_AMBULATORY_CARE_PROVIDER_SITE_OTHER): Payer: Medicare Other | Admitting: Internal Medicine

## 2014-10-14 ENCOUNTER — Encounter: Payer: Self-pay | Admitting: Internal Medicine

## 2014-10-14 VITALS — BP 110/70 | HR 65 | Temp 97.6°F | Ht 64.0 in | Wt 172.0 lb

## 2014-10-14 DIAGNOSIS — R1310 Dysphagia, unspecified: Secondary | ICD-10-CM

## 2014-10-14 DIAGNOSIS — Z1239 Encounter for other screening for malignant neoplasm of breast: Secondary | ICD-10-CM | POA: Diagnosis not present

## 2014-10-14 DIAGNOSIS — Z860100 Personal history of colon polyps, unspecified: Secondary | ICD-10-CM

## 2014-10-14 DIAGNOSIS — E7800 Pure hypercholesterolemia, unspecified: Secondary | ICD-10-CM

## 2014-10-14 DIAGNOSIS — R42 Dizziness and giddiness: Secondary | ICD-10-CM

## 2014-10-14 DIAGNOSIS — Z0000 Encounter for general adult medical examination without abnormal findings: Secondary | ICD-10-CM

## 2014-10-14 DIAGNOSIS — I10 Essential (primary) hypertension: Secondary | ICD-10-CM | POA: Diagnosis not present

## 2014-10-14 DIAGNOSIS — K2970 Gastritis, unspecified, without bleeding: Secondary | ICD-10-CM

## 2014-10-14 DIAGNOSIS — M542 Cervicalgia: Secondary | ICD-10-CM

## 2014-10-14 LAB — LIPID PANEL
CHOL/HDL RATIO: 2
CHOLESTEROL: 160 mg/dL (ref 0–200)
HDL: 71.1 mg/dL (ref >39.00)
LDL CALC: 70 mg/dL (ref 0–99)
NONHDL: 88.9
Triglycerides: 95 mg/dL (ref 0.0–149.0)
VLDL: 19 mg/dL (ref 0.0–40.0)

## 2014-10-14 LAB — BASIC METABOLIC PANEL
BUN: 14 mg/dL (ref 6–23)
CO2: 31 meq/L (ref 19–32)
CREATININE: 0.75 mg/dL (ref 0.40–1.20)
Calcium: 9 mg/dL (ref 8.4–10.5)
Chloride: 104 mEq/L (ref 96–112)
GFR: 80.55 mL/min (ref >60.00)
GLUCOSE: 105 mg/dL — AB (ref 70–99)
Potassium: 4.5 mEq/L (ref 3.5–5.1)
Sodium: 138 mEq/L (ref 135–145)

## 2014-10-14 LAB — HEPATIC FUNCTION PANEL
ALBUMIN: 4 g/dL (ref 3.5–5.2)
ALK PHOS: 84 U/L (ref 39–117)
ALT: 16 U/L (ref 0–35)
AST: 19 U/L (ref 0–37)
Bilirubin, Direct: 0.1 mg/dL (ref 0.0–0.3)
Total Bilirubin: 0.5 mg/dL (ref 0.2–1.2)
Total Protein: 7.2 g/dL (ref 6.0–8.3)

## 2014-10-14 MED ORDER — ROSUVASTATIN CALCIUM 10 MG PO TABS: 10.0000 mg | ORAL_TABLET | Freq: Every day | ORAL | Status: DC

## 2014-10-14 MED ORDER — ESOMEPRAZOLE MAGNESIUM 40 MG PO CPDR: 40.0000 mg | DELAYED_RELEASE_CAPSULE | Freq: Two times a day (BID) | ORAL | Status: DC

## 2014-10-14 NOTE — Progress Notes (Signed)
[name], female   DOB: 07/26/1940, 74 y.o.   MRN: [id number]   Subjective:    [name], female    DOB: 14-Apr-1941, 74 y.o.   MRN: [id number]  HPI  Patient here to follow up on her current medical issues and for her physical exam.  Is now working at [employer].  Enjoying her job.  Tries to stay active.  No cardiac symptoms with increased activity or exertion reported.  Previously saw Dr Manuella Ghazi.  Had MRI.  See MRI report for details.  Still with some neck issues, but does not bother her daily.  Still with dizziness intermittently.  Notices if she bends over.  No headache reported.   Eating and drinking well.  No nausea or vomiting.  No bowel change.  Swallowing better.  S/p recent dilatation.    Past Medical History  Diagnosis Date  . Osteoporosis   . Fibrocystic breast disease   . Reflux esophagitis   . Hypercholesterolemia   . MAI (mycobacterium avium-intracellulare) infection   . Hypertension   . History of colon polyps   . Gastritis     EGD - schatzki's ring, gastritis and gastric polyps    Current Outpatient Prescriptions on File Prior to Visit  Medication Sig Dispense Refill  . albuterol (PROVENTIL HFA;VENTOLIN HFA) 108 (90 BASE) MCG/ACT inhaler Inhale 2 puffs into the lungs every 6 (six) hours as needed.    Marland Kitchen aspirin (ASPIRIN EC) 81 MG EC tablet Take 81 mg by mouth daily. Swallow whole.    Marland Kitchen Fluticasone-Salmeterol (ADVAIR) 250-50 MCG/DOSE AEPB Inhale 2 puffs into the lungs 2 (two) times daily.    . metoprolol succinate (TOPROL-XL) 25 MG 24 hr tablet Take 1 tablet (25 mg total) by mouth 2 (two) times daily. MUST KEEP APPOINTMENT ON 5/19 FOR FURTHER REFILLS (Patient taking differently: Take 25 mg by mouth daily. MUST KEEP APPOINTMENT ON 5/19 FOR FURTHER REFILLS) 180 tablet 0  . Multiple Vitamin (MULTIVITAMIN WITH MINERALS) TABS Take 1 tablet by mouth daily.    . Omega-3 Fatty Acids (FISH OIL) 1000 MG CAPS Take 3,000 mg by mouth daily.     No current  facility-administered medications on file prior to visit.    Review of Systems  Constitutional: Negative for appetite change and unexpected weight change.  HENT: Negative for congestion and sinus pressure.   Eyes: Negative for pain and visual disturbance.  Respiratory: Negative for cough, chest tightness and shortness of breath.   Cardiovascular: Negative for chest pain, palpitations and leg swelling.  Gastrointestinal: Negative for nausea, vomiting, abdominal pain and diarrhea.  Genitourinary: Negative for dysuria and difficulty urinating.  Musculoskeletal: Negative for back pain and joint swelling.  Skin: Negative for color change and rash.  Neurological: Positive for dizziness (persistent intermittent episodes. ). Negative for headaches.  Hematological: Negative for adenopathy. Does not bruise/bleed easily.  Psychiatric/Behavioral: Negative for dysphoric mood and agitation.       Objective:     Blood pressure recheck:    Physical Exam  Constitutional: She is oriented to person, place, and time. She appears well-developed and well-nourished.  HENT:  Nose: Nose normal.  Mouth/Throat: Oropharynx is clear and moist.  Eyes: Right eye exhibits no discharge. Left eye exhibits no discharge. No scleral icterus.  Neck: Neck supple. No thyromegaly present.  Cardiovascular: Normal rate and regular rhythm.   Pulmonary/Chest: Breath sounds normal. No accessory muscle usage. No tachypnea. No respiratory distress. She has no decreased breath sounds. She has no  wheezes. She has no rhonchi. Right breast exhibits no inverted nipple, no mass, no nipple discharge and no tenderness (no axillary adenopathy). Left breast exhibits no inverted nipple, no mass, no nipple discharge and no tenderness (no axilarry adenopathy).  Abdominal: Soft. Bowel sounds are normal. There is no tenderness.  Musculoskeletal: She exhibits no edema or tenderness.  Lymphadenopathy:    She has no cervical adenopathy.    Neurological: She is alert and oriented to person, place, and time.  Skin: Skin is warm. No rash noted.  Psychiatric: She has a normal mood and affect. Her behavior is normal.    BP 110/70 mmHg  Pulse 65  Temp(Src) 97.6 F (36.4 C) (Oral)  Ht 5\' 4"  (1.626 m)  Wt 172 lb (78.019 kg)  BMI 29.51 kg/m2  SpO2 96% Wt Readings from Last 3 Encounters:  10/14/14 172 lb (78.019 kg)  09/28/14 160 lb (72.576 kg)  03/20/14 185 lb 8 oz (84.142 kg)     Lab Results  Component Value Date   WBC 6.5 03/20/2014   HGB 14.0 03/20/2014   HCT 41.6 03/20/2014   PLT 248 03/20/2014   GLUCOSE 105* 10/14/2014   CHOL 160 10/14/2014   TRIG 95.0 10/14/2014   HDL 71.10 10/14/2014   LDLCALC 70 10/14/2014   ALT 16 10/14/2014   AST 19 10/14/2014   NA 138 10/14/2014   K 4.5 10/14/2014   CL 104 10/14/2014   CREATININE 0.75 10/14/2014   BUN 14 10/14/2014   CO2 31 10/14/2014   TSH 2.245 03/20/2014       Assessment & Plan:   Problem List Items Addressed This Visit    Dizziness - Primary    Still with intermittent dizziness.  Still with some neck issues.  Previous MRI with comments of incidental meningioma and chronic small vessel disease.  Refer back to Dr Manuella Ghazi for evaluation and question of need for any f/u regarding the meningioma.        Relevant Orders   Ambulatory referral to Neurology   Dysphagia    Had EGD 09/28/14 - schatzi's ring and gastritis.  S/p dilatation.  Swallowing better.        Essential hypertension, benign    Blood pressure doing well.  Same medication regimen.  Follow pressures.  Follow metabolic panel.       Relevant Medications   rosuvastatin (CRESTOR) 10 MG tablet   Other Relevant Orders   Basic metabolic panel (Completed)   Gastritis    Noted on EGD.  On nexium.  Doing well.       Health care maintenance    Physical today 10/14/14.  Mammogram 12/19/13 -Birads I.  Colonoscopy 09/28/14 - internal hemorrhoids.  Recommended f/u in five years.        History of colonic  polyps    Colonoscopy 09/28/14 - internal hemorrhoids.  Recommended f/u in five years.        Hypercholesterolemia    Low cholesterol diet and exercise.  On crestor.  Follow lipid panel and liver function tests.       Relevant Medications   rosuvastatin (CRESTOR) 10 MG tablet   Other Relevant Orders   Lipid panel (Completed)   Hepatic function panel (Completed)   Neck pain    Is better.  Still with some intermittent discomfort and some dizziness.  Refer back to Dr Manuella Ghazi.         Other Visit Diagnoses    Breast cancer screening        Relevant  Orders    MM DIGITAL SCREENING BILATERAL      I spent 25 minutes with the patient and more than 50% of the time was spent in consultation regarding the above.     Einar Pheasant, MD

## 2014-10-14 NOTE — Progress Notes (Signed)
Pre visit review using our clinic review tool, if applicable. No additional management support is needed unless otherwise documented below in the visit note. 

## 2014-10-15 ENCOUNTER — Encounter: Payer: Self-pay | Admitting: *Deleted

## 2014-10-16 ENCOUNTER — Encounter: Payer: Self-pay | Admitting: Internal Medicine

## 2014-10-18 ENCOUNTER — Encounter: Payer: Self-pay | Admitting: Internal Medicine

## 2014-10-18 DIAGNOSIS — R42 Dizziness and giddiness: Secondary | ICD-10-CM | POA: Insufficient documentation

## 2014-10-18 DIAGNOSIS — Z0000 Encounter for general adult medical examination without abnormal findings: Secondary | ICD-10-CM | POA: Insufficient documentation

## 2014-10-18 NOTE — Assessment & Plan Note (Signed)
Low cholesterol diet and exercise.  On crestor.  Follow lipid panel and liver function tests.  

## 2014-10-18 NOTE — Assessment & Plan Note (Signed)
Colonoscopy 09/28/14 - internal hemorrhoids.  Recommended f/u in five years.

## 2014-10-18 NOTE — Assessment & Plan Note (Signed)
Still with intermittent dizziness.  Still with some neck issues.  Previous MRI with comments of incidental meningioma and chronic small vessel disease.  Refer back to Dr Manuella Ghazi for evaluation and question of need for any f/u regarding the meningioma.

## 2014-10-18 NOTE — Assessment & Plan Note (Signed)
Is better.  Still with some intermittent discomfort and some dizziness.  Refer back to Dr Manuella Ghazi.

## 2014-10-18 NOTE — Assessment & Plan Note (Signed)
Noted on EGD.  On nexium.  Doing well.

## 2014-10-18 NOTE — Assessment & Plan Note (Signed)
Physical today 10/14/14.  Mammogram 12/19/13 -Birads I.  Colonoscopy 09/28/14 - internal hemorrhoids.  Recommended f/u in five years.

## 2014-10-18 NOTE — Assessment & Plan Note (Signed)
Blood pressure doing well.  Same medication regimen.  Follow pressures.  Follow metabolic panel.   

## 2014-10-18 NOTE — Assessment & Plan Note (Signed)
Had EGD 09/28/14 - schatzi's ring and gastritis.  S/p dilatation.  Swallowing better.

## 2014-12-14 DIAGNOSIS — M5481 Occipital neuralgia: Secondary | ICD-10-CM | POA: Diagnosis not present

## 2014-12-14 DIAGNOSIS — D329 Benign neoplasm of meninges, unspecified: Secondary | ICD-10-CM | POA: Diagnosis not present

## 2015-01-08 ENCOUNTER — Other Ambulatory Visit: Payer: Self-pay | Admitting: Neurology

## 2015-01-08 DIAGNOSIS — D329 Benign neoplasm of meninges, unspecified: Secondary | ICD-10-CM

## 2015-01-21 ENCOUNTER — Other Ambulatory Visit: Payer: Self-pay | Admitting: *Deleted

## 2015-01-21 ENCOUNTER — Telehealth: Payer: Self-pay | Admitting: *Deleted

## 2015-01-21 MED ORDER — METOPROLOL SUCCINATE ER 25 MG PO TB24: 25.0000 mg | ORAL_TABLET | Freq: Two times a day (BID) | ORAL | Status: DC

## 2015-01-21 NOTE — Telephone Encounter (Signed)
Patient requested a refill on her blood pressure medication-Thanks

## 2015-02-10 DIAGNOSIS — Z08 Encounter for follow-up examination after completed treatment for malignant neoplasm: Secondary | ICD-10-CM | POA: Diagnosis not present

## 2015-02-10 DIAGNOSIS — L309 Dermatitis, unspecified: Secondary | ICD-10-CM | POA: Diagnosis not present

## 2015-02-10 DIAGNOSIS — Z85828 Personal history of other malignant neoplasm of skin: Secondary | ICD-10-CM | POA: Diagnosis not present

## 2015-02-10 DIAGNOSIS — L821 Other seborrheic keratosis: Secondary | ICD-10-CM | POA: Diagnosis not present

## 2015-02-10 DIAGNOSIS — D0462 Carcinoma in situ of skin of left upper limb, including shoulder: Secondary | ICD-10-CM | POA: Diagnosis not present

## 2015-02-10 DIAGNOSIS — D485 Neoplasm of uncertain behavior of skin: Secondary | ICD-10-CM | POA: Diagnosis not present

## 2015-02-10 DIAGNOSIS — L570 Actinic keratosis: Secondary | ICD-10-CM | POA: Diagnosis not present

## 2015-02-10 DIAGNOSIS — D2339 Other benign neoplasm of skin of other parts of face: Secondary | ICD-10-CM | POA: Diagnosis not present

## 2015-02-17 ENCOUNTER — Ambulatory Visit (INDEPENDENT_AMBULATORY_CARE_PROVIDER_SITE_OTHER): Payer: Medicare Other | Admitting: Internal Medicine

## 2015-02-17 ENCOUNTER — Encounter: Payer: Self-pay | Admitting: Internal Medicine

## 2015-02-17 VITALS — BP 110/70 | HR 74 | Temp 97.9°F | Resp 18 | Ht 64.0 in | Wt 174.8 lb

## 2015-02-17 DIAGNOSIS — I10 Essential (primary) hypertension: Secondary | ICD-10-CM

## 2015-02-17 DIAGNOSIS — Z23 Encounter for immunization: Secondary | ICD-10-CM | POA: Diagnosis not present

## 2015-02-17 DIAGNOSIS — Z86010 Personal history of colonic polyps: Secondary | ICD-10-CM

## 2015-02-17 DIAGNOSIS — R51 Headache: Secondary | ICD-10-CM

## 2015-02-17 DIAGNOSIS — K219 Gastro-esophageal reflux disease without esophagitis: Secondary | ICD-10-CM

## 2015-02-17 DIAGNOSIS — R519 Headache, unspecified: Secondary | ICD-10-CM

## 2015-02-17 DIAGNOSIS — D329 Benign neoplasm of meninges, unspecified: Secondary | ICD-10-CM

## 2015-02-17 DIAGNOSIS — E7800 Pure hypercholesterolemia, unspecified: Secondary | ICD-10-CM

## 2015-02-17 MED ORDER — PANTOPRAZOLE SODIUM 40 MG PO TBEC: 40.0000 mg | DELAYED_RELEASE_TABLET | Freq: Two times a day (BID) | ORAL | Status: DC

## 2015-02-17 NOTE — Progress Notes (Signed)
Pre-visit discussion using our clinic review tool. No additional management support is needed unless otherwise documented below in the visit note.  

## 2015-02-17 NOTE — Progress Notes (Signed)
[name], female   DOB: [birth year], 74 y.o.   MRN: [id number]   Subjective:    [name], female    DOB: August 26, 1940, 74 y.o.   MRN: [id number]  HPI  Patient with past history of hypertension, hypercholesterolemia and presumed left occipital neuralgia.  She comes in today to follow up on these issues.  She has been seeing Dr Manuella Ghazi for her headaches.  Diagnosed with left occipital neuralgia.  They discussed injections.  She wanted to hold on injection.  Is doing better.  Breathing stable.  No increased sob.  No acid reflux.  No abdominal pain or cramping.  Bowels stable.  Needs to change to protonix.  Cost is an issue for her.  Does report her legs ache at times.  Bilateral - worse in evening.    Past Medical History  Diagnosis Date  . Osteoporosis   . Fibrocystic breast disease   . Reflux esophagitis   . Hypercholesterolemia   . MAI (mycobacterium avium-intracellulare) infection ([place])   . Hypertension   . History of colon polyps   . Gastritis     EGD - schatzki's ring, gastritis and gastric polyps   Past Surgical History  Procedure Laterality Date  . Abdominal hysterectomy  1998  . Hemorrhoid surgery    . Breast surgery      cyst(benign)  . Nasal septum surgery    . Foot surgery Right   . Hernia repair  10/07  . Excision of right axillary lipoma  10/07  . Colonoscopy N/A 09/28/2014    Procedure: COLONOSCOPY;  Surgeon: Manya Silvas, MD;  Location: [facility] ENDOSCOPY;  Service: Endoscopy;  Laterality: N/A;  . Esophagogastroduodenoscopy N/A 09/28/2014    Procedure: ESOPHAGOGASTRODUODENOSCOPY (EGD);  Surgeon: Manya Silvas, MD;  Location: [facility] ENDOSCOPY;  Service: Endoscopy;  Laterality: N/A;   Family History  Problem Relation Age of Onset  . Heart disease Mother 53    heart attack  . Cancer Father     lung  . Stroke Brother   . Diabetes Brother   . Cancer Sister     breast cancer  . Diabetes Sister    Social History   Social History  .  Marital Status: Widowed    Spouse Name: N/A  . Number of Children: N/A  . Years of Education: N/A   Social History Main Topics  . Smoking status: Never Smoker   . Smokeless tobacco: Never Used  . Alcohol Use: No  . Drug Use: No  . Sexual Activity: Not Asked   Other Topics Concern  . None   Social History Narrative    Outpatient Encounter Prescriptions as of 02/17/2015  Medication Sig  . albuterol (PROVENTIL HFA;VENTOLIN HFA) 108 (90 BASE) MCG/ACT inhaler Inhale 2 puffs into the lungs every 6 (six) hours as needed.  Marland Kitchen aspirin (ASPIRIN EC) 81 MG EC tablet Take 81 mg by mouth daily. Swallow whole.  Marland Kitchen Fluticasone-Salmeterol (ADVAIR) 250-50 MCG/DOSE AEPB Inhale 2 puffs into the lungs 2 (two) times daily.  . metoprolol succinate (TOPROL-XL) 25 MG 24 hr tablet Take 1 tablet (25 mg total) by mouth 2 (two) times daily.  . Multiple Vitamin (MULTIVITAMIN WITH MINERALS) TABS Take 1 tablet by mouth daily.  . Omega-3 Fatty Acids (FISH OIL) 1000 MG CAPS Take 3,000 mg by mouth daily.  . rosuvastatin (CRESTOR) 10 MG tablet Take 1 tablet (10 mg total) by mouth daily.  . [DISCONTINUED] esomeprazole (NEXIUM) 40 MG capsule Take 1 capsule (  40 mg total) by mouth 2 (two) times daily before a meal.  . [DISCONTINUED] pantoprazole (PROTONIX) 40 MG tablet Take 1 tablet (40 mg total) by mouth 2 (two) times daily before a meal.   No facility-administered encounter medications on file as of 02/17/2015.    Review of Systems  Constitutional: Negative for appetite change and unexpected weight change.  HENT: Negative for congestion and sinus pressure.   Eyes: Negative for pain and visual disturbance.  Respiratory: Negative for cough, chest tightness and shortness of breath.   Cardiovascular: Negative for chest pain, palpitations and leg swelling.  Gastrointestinal: Negative for nausea, vomiting, abdominal pain and diarrhea.  Genitourinary: Negative for dysuria and difficulty urinating.  Musculoskeletal: Negative  for back pain and joint swelling.  Skin: Negative for color change and rash.  Neurological: Negative for dizziness, light-headedness and headaches.  Hematological: Negative for adenopathy. Does not bruise/bleed easily.  Psychiatric/Behavioral: Negative for dysphoric mood and agitation.       Objective:    Physical Exam  Constitutional: She appears well-developed and well-nourished. No distress.  HENT:  Nose: Nose normal.  Mouth/Throat: Oropharynx is clear and moist.  Eyes: Conjunctivae are normal. Right eye exhibits no discharge. Left eye exhibits no discharge.  Neck: Neck supple. No thyromegaly present.  Cardiovascular: Normal rate and regular rhythm.   Pulmonary/Chest: Breath sounds normal. No respiratory distress. She has no wheezes.  Abdominal: Soft. Bowel sounds are normal. There is no tenderness.  Musculoskeletal: She exhibits no edema or tenderness.  Lymphadenopathy:    She has no cervical adenopathy.  Skin: No rash noted. No erythema.  Psychiatric: She has a normal mood and affect. Her behavior is normal.    BP 110/70 mmHg  Pulse 74  Temp(Src) 97.9 F (36.6 C) (Oral)  Resp 18  Ht 5\' 4"  (1.626 m)  Wt 174 lb 12 oz (79.266 kg)  BMI 29.98 kg/m2  SpO2 94% Wt Readings from Last 3 Encounters:  02/17/15 174 lb 12 oz (79.266 kg)  10/14/14 172 lb (78.019 kg)  09/28/14 160 lb (72.576 kg)     Lab Results  Component Value Date   WBC 6.5 03/20/2014   HGB 14.0 03/20/2014   HCT 41.6 03/20/2014   PLT 248 03/20/2014   GLUCOSE 105* 10/14/2014   CHOL 160 10/14/2014   TRIG 95.0 10/14/2014   HDL 71.10 10/14/2014   LDLCALC 70 10/14/2014   ALT 16 10/14/2014   AST 19 10/14/2014   NA 138 10/14/2014   K 4.5 10/14/2014   CL 104 10/14/2014   CREATININE 0.75 10/14/2014   BUN 14 10/14/2014   CO2 31 10/14/2014   TSH 2.245 03/20/2014       Assessment & Plan:   Problem List Items Addressed This Visit    Essential hypertension, benign    Blood pressure under good control.   Continue same medication regimen.  Follow pressures.  Follow metabolic panel.        Relevant Orders   CBC with Differential/Platelet   TSH   Basic metabolic panel   GERD (gastroesophageal reflux disease)    Discussed changing to protonix.  Will follow.        Headache    Seeing Dr Manuella Ghazi.  Had MRI.  See his note for details.  Diagnosed with occipital neuralgia.  Recommended an occipital nerve block.  She declines.  Feels better.  Follow.        History of colonic polyps    Colonoscopy 09/28/14 - internal hemorrhoids.  Recommended f/u in  five years.        Hypercholesterolemia    On crestor.  Low cholesterol diet and exercise.  Follow lipid panel and liver function tests.        Relevant Orders   Lipid panel   Hepatic function panel   Meningioma ([place])    Seeing Dr Manuella Ghazi.  MRI recently.  See his report.  Recommended f/u MRI in one year.         Other Visit Diagnoses    Need for pneumococcal vaccination    -  Primary    Relevant Orders    Pneumococcal conjugate vaccine 13-valent (Completed)        Einar Pheasant, MD

## 2015-02-19 ENCOUNTER — Telehealth: Payer: Self-pay | Admitting: Internal Medicine

## 2015-02-19 ENCOUNTER — Other Ambulatory Visit: Payer: Self-pay | Admitting: *Deleted

## 2015-02-19 MED ORDER — OMEPRAZOLE 20 MG PO CPDR: 20.0000 mg | DELAYED_RELEASE_CAPSULE | Freq: Two times a day (BID) | ORAL | Status: DC

## 2015-02-19 NOTE — Telephone Encounter (Signed)
Pt called stating that she needs a rx for amepracole 20mg  to [facility] .Marland Kitchen Please advise

## 2015-02-19 NOTE — Telephone Encounter (Signed)
We just sent in Protonix 40mg  BID on 02/17/15 to [facility]. Shouldn't be on both. Left message for pt to contact mail order company or call me back if there was a problem with the recent medication sent in.

## 2015-02-21 ENCOUNTER — Encounter: Payer: Self-pay | Admitting: Internal Medicine

## 2015-02-21 DIAGNOSIS — D329 Benign neoplasm of meninges, unspecified: Secondary | ICD-10-CM | POA: Insufficient documentation

## 2015-02-21 NOTE — Assessment & Plan Note (Signed)
Seeing Dr Manuella Ghazi.  Had MRI.  See his note for details.  Diagnosed with occipital neuralgia.  Recommended an occipital nerve block.  She declines.  Feels better.  Follow.

## 2015-02-21 NOTE — Assessment & Plan Note (Signed)
On crestor.  Low cholesterol diet and exercise.  Follow lipid panel and liver function tests.   

## 2015-02-21 NOTE — Assessment & Plan Note (Signed)
Colonoscopy 09/28/14 - internal hemorrhoids.  Recommended f/u in five years.   

## 2015-02-21 NOTE — Assessment & Plan Note (Signed)
Discussed changing to protonix.  Will follow.

## 2015-02-21 NOTE — Assessment & Plan Note (Signed)
Seeing Dr Manuella Ghazi.  MRI recently.  See his report.  Recommended f/u MRI in one year.

## 2015-02-21 NOTE — Assessment & Plan Note (Signed)
Blood pressure under good control.  Continue same medication regimen.  Follow pressures.  Follow metabolic panel.   

## 2015-02-24 ENCOUNTER — Other Ambulatory Visit: Payer: Self-pay | Admitting: Internal Medicine

## 2015-02-24 ENCOUNTER — Ambulatory Visit
Admission: RE | Admit: 2015-02-24 | Discharge: 2015-02-24 | Disposition: A | Discharge status: Home / Self Care | Payer: Medicare Other | Source: Ambulatory Visit | Attending: Internal Medicine | Admitting: Internal Medicine

## 2015-02-24 ENCOUNTER — Other Ambulatory Visit (INDEPENDENT_AMBULATORY_CARE_PROVIDER_SITE_OTHER): Payer: Medicare Other

## 2015-02-24 DIAGNOSIS — E7800 Pure hypercholesterolemia, unspecified: Secondary | ICD-10-CM

## 2015-02-24 DIAGNOSIS — I10 Essential (primary) hypertension: Secondary | ICD-10-CM | POA: Diagnosis not present

## 2015-02-24 DIAGNOSIS — Z1231 Encounter for screening mammogram for malignant neoplasm of breast: Secondary | ICD-10-CM | POA: Diagnosis not present

## 2015-02-24 DIAGNOSIS — Z1239 Encounter for other screening for malignant neoplasm of breast: Secondary | ICD-10-CM

## 2015-02-24 LAB — BASIC METABOLIC PANEL
BUN: 14 mg/dL (ref 6–23)
CHLORIDE: 103 meq/L (ref 96–112)
CO2: 28 meq/L (ref 19–32)
Calcium: 9.1 mg/dL (ref 8.4–10.5)
Creatinine, Ser: 0.75 mg/dL (ref 0.40–1.20)
GFR: 80.47 mL/min (ref >60.00)
GLUCOSE: 96 mg/dL (ref 70–99)
POTASSIUM: 4.8 meq/L (ref 3.5–5.1)
SODIUM: 140 meq/L (ref 135–145)

## 2015-02-24 LAB — LIPID PANEL
CHOL/HDL RATIO: 2
Cholesterol: 163 mg/dL (ref 0–200)
HDL: 82.5 mg/dL (ref >39.00)
LDL CALC: 66 mg/dL (ref 0–99)
NONHDL: 80.02
Triglycerides: 70 mg/dL (ref 0.0–149.0)
VLDL: 14 mg/dL (ref 0.0–40.0)

## 2015-02-24 LAB — HEPATIC FUNCTION PANEL
ALBUMIN: 5 g/dL (ref 3.5–5.2)
ALT: 18 U/L (ref 0–35)
AST: 21 U/L (ref 0–37)
Alkaline Phosphatase: 76 U/L (ref 39–117)
BILIRUBIN TOTAL: 0.7 mg/dL (ref 0.2–1.2)
Bilirubin, Direct: 0.1 mg/dL (ref 0.0–0.3)
Total Protein: 7.3 g/dL (ref 6.0–8.3)

## 2015-02-24 LAB — TSH: TSH: 1.74 u[IU]/mL (ref 0.35–4.50)

## 2015-02-24 MED ORDER — LORAZEPAM 0.5 MG PO TABS
ORAL_TABLET | ORAL | Status: DC
Start: 2015-02-24 — End: 2018-09-05

## 2015-02-24 NOTE — Progress Notes (Signed)
Order placed for lorazepam refill to take just prior to scan.

## 2015-02-25 ENCOUNTER — Telehealth: Payer: Self-pay | Admitting: *Deleted

## 2015-02-25 NOTE — Telephone Encounter (Signed)
Ok.  Please call and inform pt.  Thanks.

## 2015-02-25 NOTE — Telephone Encounter (Signed)
Lab called they need a re-collect on cbc

## 2015-02-26 NOTE — Telephone Encounter (Signed)
Called pt didn't get an answer will try again later

## 2015-03-10 DIAGNOSIS — D0462 Carcinoma in situ of skin of left upper limb, including shoulder: Secondary | ICD-10-CM | POA: Diagnosis not present

## 2015-03-10 DIAGNOSIS — Z23 Encounter for immunization: Secondary | ICD-10-CM | POA: Diagnosis not present

## 2015-03-10 DIAGNOSIS — D485 Neoplasm of uncertain behavior of skin: Secondary | ICD-10-CM | POA: Diagnosis not present

## 2015-03-10 DIAGNOSIS — L570 Actinic keratosis: Secondary | ICD-10-CM | POA: Diagnosis not present

## 2015-03-11 DIAGNOSIS — Z23 Encounter for immunization: Secondary | ICD-10-CM | POA: Diagnosis not present

## 2015-03-31 DIAGNOSIS — Z23 Encounter for immunization: Secondary | ICD-10-CM | POA: Diagnosis not present

## 2015-03-31 DIAGNOSIS — L570 Actinic keratosis: Secondary | ICD-10-CM | POA: Diagnosis not present

## 2015-03-31 DIAGNOSIS — D485 Neoplasm of uncertain behavior of skin: Secondary | ICD-10-CM | POA: Diagnosis not present

## 2015-03-31 DIAGNOSIS — X32XXXA Exposure to sunlight, initial encounter: Secondary | ICD-10-CM | POA: Diagnosis not present

## 2015-04-05 ENCOUNTER — Telehealth: Payer: Self-pay | Admitting: Internal Medicine

## 2015-04-05 NOTE — Telephone Encounter (Signed)
If having scratchy throat and some cough that just started yesterday, this could be allergies, viral infection, etc.  If having drainage, then would recommend saline nasal spray - flush nose at least 2-3x/day.  Can use robitussin for cough.  Gargle with salt water and tylenol for throat.  Stay hydrated.  If progresses or if symptoms change or worsen, can be evaluated.

## 2015-04-05 NOTE — Telephone Encounter (Signed)
You last saw her on 02/17/15.  Please advise?

## 2015-04-05 NOTE — Telephone Encounter (Signed)
Pt called about having scratchy throat it started last night and some coughing. Pt wanted to know if something can be called in? No appt avail. Pt does not want to see another provider. [facility]acy is [id number] - [place], [place] [address] Thank You!

## 2015-04-05 NOTE — Telephone Encounter (Signed)
Left message for her to return my call.

## 2015-04-07 NOTE — Telephone Encounter (Signed)
Spoke with the patient, she verbalized understanding and was going to try some the discussed options.

## 2015-04-15 DIAGNOSIS — J208 Acute bronchitis due to other specified organisms: Secondary | ICD-10-CM | POA: Diagnosis not present

## 2015-04-15 DIAGNOSIS — B9689 Other specified bacterial agents as the cause of diseases classified elsewhere: Secondary | ICD-10-CM | POA: Diagnosis not present

## 2015-04-15 DIAGNOSIS — J019 Acute sinusitis, unspecified: Secondary | ICD-10-CM | POA: Diagnosis not present

## 2015-05-04 ENCOUNTER — Ambulatory Visit: Payer: Medicare Other

## 2015-05-05 ENCOUNTER — Ambulatory Visit
Admission: RE | Admit: 2015-05-05 | Discharge: 2015-05-05 | Disposition: A | Discharge status: Home / Self Care | Payer: Medicare Other | Source: Ambulatory Visit | Attending: Neurology | Admitting: Neurology

## 2015-05-05 DIAGNOSIS — I739 Peripheral vascular disease, unspecified: Secondary | ICD-10-CM | POA: Diagnosis not present

## 2015-05-05 DIAGNOSIS — D329 Benign neoplasm of meninges, unspecified: Secondary | ICD-10-CM

## 2015-05-05 MED ORDER — GADOBENATE DIMEGLUMINE 529 MG/ML IV SOLN
15.0000 mL | Freq: Once | INTRAVENOUS | Status: AC | PRN
  Administered 2015-05-05: 15 mL via INTRAVENOUS

## 2015-05-25 ENCOUNTER — Telehealth: Payer: Self-pay | Admitting: Internal Medicine

## 2015-05-25 ENCOUNTER — Other Ambulatory Visit: Payer: Self-pay | Admitting: Internal Medicine

## 2015-05-25 DIAGNOSIS — J208 Acute bronchitis due to other specified organisms: Secondary | ICD-10-CM | POA: Diagnosis not present

## 2015-05-25 DIAGNOSIS — B9689 Other specified bacterial agents as the cause of diseases classified elsewhere: Secondary | ICD-10-CM | POA: Diagnosis not present

## 2015-05-25 DIAGNOSIS — J019 Acute sinusitis, unspecified: Secondary | ICD-10-CM | POA: Diagnosis not present

## 2015-05-25 MED ORDER — ROSUVASTATIN CALCIUM 10 MG PO TABS: 10.0000 mg | ORAL_TABLET | Freq: Every day | ORAL | Status: DC

## 2015-05-25 NOTE — Telephone Encounter (Signed)
Refill sent.

## 2015-05-25 NOTE — Telephone Encounter (Signed)
Pt called wanting to get a refill on rosuvastatin (CRESTOR) 10 MG tablet [facility] [place] [place]  90 day..  Please advise pt if any questions [phone]

## 2015-06-21 ENCOUNTER — Ambulatory Visit: Payer: Medicare Other | Admitting: Internal Medicine

## 2015-07-12 DIAGNOSIS — D329 Benign neoplasm of meninges, unspecified: Secondary | ICD-10-CM | POA: Diagnosis not present

## 2015-07-12 DIAGNOSIS — M5481 Occipital neuralgia: Secondary | ICD-10-CM | POA: Diagnosis not present

## 2015-09-10 ENCOUNTER — Telehealth: Payer: Self-pay | Admitting: Internal Medicine

## 2015-09-10 NOTE — Telephone Encounter (Signed)
Left msg to call office to schedule AWV with Denisa/msn °

## 2016-01-08 DIAGNOSIS — B9689 Other specified bacterial agents as the cause of diseases classified elsewhere: Secondary | ICD-10-CM | POA: Diagnosis not present

## 2016-01-08 DIAGNOSIS — J208 Acute bronchitis due to other specified organisms: Secondary | ICD-10-CM | POA: Diagnosis not present

## 2016-01-08 DIAGNOSIS — J019 Acute sinusitis, unspecified: Secondary | ICD-10-CM | POA: Diagnosis not present

## 2016-02-09 DIAGNOSIS — L570 Actinic keratosis: Secondary | ICD-10-CM | POA: Diagnosis not present

## 2016-02-09 DIAGNOSIS — L821 Other seborrheic keratosis: Secondary | ICD-10-CM | POA: Diagnosis not present

## 2016-02-09 DIAGNOSIS — S0080XA Unspecified superficial injury of other part of head, initial encounter: Secondary | ICD-10-CM | POA: Diagnosis not present

## 2016-02-09 DIAGNOSIS — L820 Inflamed seborrheic keratosis: Secondary | ICD-10-CM | POA: Diagnosis not present

## 2016-02-09 DIAGNOSIS — Z85828 Personal history of other malignant neoplasm of skin: Secondary | ICD-10-CM | POA: Diagnosis not present

## 2016-02-09 DIAGNOSIS — Z08 Encounter for follow-up examination after completed treatment for malignant neoplasm: Secondary | ICD-10-CM | POA: Diagnosis not present

## 2016-02-09 DIAGNOSIS — D485 Neoplasm of uncertain behavior of skin: Secondary | ICD-10-CM | POA: Diagnosis not present

## 2016-02-09 DIAGNOSIS — Z872 Personal history of diseases of the skin and subcutaneous tissue: Secondary | ICD-10-CM | POA: Diagnosis not present

## 2016-02-09 DIAGNOSIS — X32XXXA Exposure to sunlight, initial encounter: Secondary | ICD-10-CM | POA: Diagnosis not present

## 2016-02-14 DIAGNOSIS — K2990 Gastroduodenitis, unspecified, without bleeding: Secondary | ICD-10-CM | POA: Diagnosis not present

## 2016-02-14 DIAGNOSIS — K219 Gastro-esophageal reflux disease without esophagitis: Secondary | ICD-10-CM | POA: Diagnosis not present

## 2016-02-14 DIAGNOSIS — K2970 Gastritis, unspecified, without bleeding: Secondary | ICD-10-CM | POA: Diagnosis not present

## 2016-02-14 DIAGNOSIS — R1310 Dysphagia, unspecified: Secondary | ICD-10-CM | POA: Diagnosis not present

## 2016-02-14 DIAGNOSIS — K222 Esophageal obstruction: Secondary | ICD-10-CM | POA: Diagnosis not present

## 2016-03-06 ENCOUNTER — Other Ambulatory Visit: Payer: Self-pay

## 2016-03-06 ENCOUNTER — Telehealth: Payer: Self-pay | Admitting: *Deleted

## 2016-03-06 MED ORDER — ROSUVASTATIN CALCIUM 10 MG PO TABS: 10.0000 mg | ORAL_TABLET | Freq: Every day | ORAL | 2 refills | Status: DC

## 2016-03-06 MED ORDER — METOPROLOL SUCCINATE ER 25 MG PO TB24: 25.0000 mg | ORAL_TABLET | Freq: Two times a day (BID) | ORAL | 0 refills | Status: DC

## 2016-03-06 NOTE — Telephone Encounter (Signed)
Patient has requested a medication refill for metoprolol and rosuvastatin  Pharmacy [place]

## 2016-03-06 NOTE — Telephone Encounter (Signed)
Sent rx to pharmacy

## 2016-03-15 DIAGNOSIS — R233 Spontaneous ecchymoses: Secondary | ICD-10-CM | POA: Diagnosis not present

## 2016-03-15 DIAGNOSIS — L570 Actinic keratosis: Secondary | ICD-10-CM | POA: Diagnosis not present

## 2016-03-15 DIAGNOSIS — X32XXXA Exposure to sunlight, initial encounter: Secondary | ICD-10-CM | POA: Diagnosis not present

## 2016-03-22 ENCOUNTER — Ambulatory Visit (INDEPENDENT_AMBULATORY_CARE_PROVIDER_SITE_OTHER): Payer: Medicare Other | Admitting: Internal Medicine

## 2016-03-22 ENCOUNTER — Encounter: Payer: Self-pay | Admitting: Internal Medicine

## 2016-03-22 VITALS — BP 132/72 | HR 70 | Temp 98.1°F | Ht 64.0 in | Wt 189.8 lb

## 2016-03-22 DIAGNOSIS — Z0000 Encounter for general adult medical examination without abnormal findings: Secondary | ICD-10-CM | POA: Diagnosis not present

## 2016-03-22 DIAGNOSIS — Z860100 Personal history of colon polyps, unspecified: Secondary | ICD-10-CM

## 2016-03-22 DIAGNOSIS — N644 Mastodynia: Secondary | ICD-10-CM

## 2016-03-22 DIAGNOSIS — R519 Headache, unspecified: Secondary | ICD-10-CM

## 2016-03-22 DIAGNOSIS — R1310 Dysphagia, unspecified: Secondary | ICD-10-CM

## 2016-03-22 DIAGNOSIS — I714 Abdominal aortic aneurysm, without rupture: Secondary | ICD-10-CM | POA: Diagnosis not present

## 2016-03-22 DIAGNOSIS — I10 Essential (primary) hypertension: Secondary | ICD-10-CM | POA: Diagnosis not present

## 2016-03-22 DIAGNOSIS — M79629 Pain in unspecified upper arm: Secondary | ICD-10-CM | POA: Diagnosis not present

## 2016-03-22 DIAGNOSIS — D329 Benign neoplasm of meninges, unspecified: Secondary | ICD-10-CM

## 2016-03-22 DIAGNOSIS — E785 Hyperlipidemia, unspecified: Secondary | ICD-10-CM | POA: Diagnosis not present

## 2016-03-22 DIAGNOSIS — I208 Other forms of angina pectoris: Secondary | ICD-10-CM | POA: Diagnosis not present

## 2016-03-22 DIAGNOSIS — R51 Headache: Secondary | ICD-10-CM

## 2016-03-22 DIAGNOSIS — E7800 Pure hypercholesterolemia, unspecified: Secondary | ICD-10-CM

## 2016-03-22 DIAGNOSIS — R079 Chest pain, unspecified: Secondary | ICD-10-CM | POA: Diagnosis not present

## 2016-03-22 DIAGNOSIS — R0789 Other chest pain: Secondary | ICD-10-CM | POA: Diagnosis not present

## 2016-03-22 DIAGNOSIS — K219 Gastro-esophageal reflux disease without esophagitis: Secondary | ICD-10-CM | POA: Diagnosis not present

## 2016-03-22 DIAGNOSIS — R002 Palpitations: Secondary | ICD-10-CM | POA: Diagnosis not present

## 2016-03-22 DIAGNOSIS — Z01818 Encounter for other preprocedural examination: Secondary | ICD-10-CM | POA: Diagnosis not present

## 2016-03-22 DIAGNOSIS — Z1231 Encounter for screening mammogram for malignant neoplasm of breast: Secondary | ICD-10-CM | POA: Diagnosis not present

## 2016-03-22 LAB — CBC WITH DIFFERENTIAL/PLATELET
BASOS PCT: 0.8 % (ref 0.0–3.0)
Basophils Absolute: 0 10*3/uL (ref 0.0–0.1)
EOS ABS: 0.3 10*3/uL (ref 0.0–0.7)
EOS PCT: 4.9 % (ref 0.0–5.0)
HEMATOCRIT: 43.2 % (ref 36.0–46.0)
HEMOGLOBIN: 14.4 g/dL (ref 12.0–15.0)
LYMPHS PCT: 25.6 % (ref 12.0–46.0)
Lymphs Abs: 1.5 10*3/uL (ref 0.7–4.0)
MCHC: 33.5 g/dL (ref 30.0–36.0)
MCV: 94.8 fl (ref 78.0–100.0)
Monocytes Absolute: 0.5 10*3/uL (ref 0.1–1.0)
Monocytes Relative: 8.2 % (ref 3.0–12.0)
Neutro Abs: 3.5 10*3/uL (ref 1.4–7.7)
Neutrophils Relative %: 60.5 % (ref 43.0–77.0)
Platelets: 244 10*3/uL (ref 150.0–400.0)
RBC: 4.55 Mil/uL (ref 3.87–5.11)
RDW: 12.9 % (ref 11.5–15.5)
WBC: 5.8 10*3/uL (ref 4.0–10.5)

## 2016-03-22 LAB — BASIC METABOLIC PANEL
BUN: 12 mg/dL (ref 6–23)
CO2: 34 meq/L — AB (ref 19–32)
Calcium: 9.5 mg/dL (ref 8.4–10.5)
Chloride: 103 mEq/L (ref 96–112)
Creatinine, Ser: 0.83 mg/dL (ref 0.40–1.20)
GFR: 71.38 mL/min (ref >60.00)
GLUCOSE: 103 mg/dL — AB (ref 70–99)
POTASSIUM: 5.1 meq/L (ref 3.5–5.1)
Sodium: 141 mEq/L (ref 135–145)

## 2016-03-22 LAB — LIPID PANEL
CHOL/HDL RATIO: 2
Cholesterol: 167 mg/dL (ref 0–200)
HDL: 76.1 mg/dL (ref >39.00)
LDL Cholesterol: 64 mg/dL (ref 0–99)
NONHDL: 91.07
Triglycerides: 137 mg/dL (ref 0.0–149.0)
VLDL: 27.4 mg/dL (ref 0.0–40.0)

## 2016-03-22 LAB — HEPATIC FUNCTION PANEL
ALBUMIN: 4.3 g/dL (ref 3.5–5.2)
ALT: 18 U/L (ref 0–35)
AST: 22 U/L (ref 0–37)
Alkaline Phosphatase: 69 U/L (ref 39–117)
BILIRUBIN TOTAL: 0.7 mg/dL (ref 0.2–1.2)
Bilirubin, Direct: 0.1 mg/dL (ref 0.0–0.3)
Total Protein: 7.3 g/dL (ref 6.0–8.3)

## 2016-03-22 LAB — TSH: TSH: 2.51 u[IU]/mL (ref 0.35–4.50)

## 2016-03-22 NOTE — Assessment & Plan Note (Signed)
Blood pressure under good control.  Continue same medication regimen.  Follow pressures.  Follow metabolic panel.   

## 2016-03-22 NOTE — Progress Notes (Signed)
[name], female   DOB: [birth year], 75 y.o.   MRN: [id number]   Subjective:    [name], female    DOB: 01-10-1941, 75 y.o.   MRN: [id number]  HPI  Patient with past history of GERD, hypercholesterolemia and hypertension.  She comes in today to follow up on these issues as well as for a complete physical exam.  She reports she is doing relatively well.  Recently saw Dr Manuella Ghazi.  He reviewed MRI results.  See note.  No headaches reported today.  Diagnosed with occipital neuralgia.  Meningioma stable.  She reports intermittent chest tightness.  May occur 2-3x/week.  Some acid reflux.  Had some issues with swallowing.  Had to stop nexium secondary to insurance.  Had to start omeprazole.  Does not work as well for her.  GI restarted her nexim.  Recommended EGD.  Not sure if the tightness is related to this.  No abdominal pain.  No urine or bowel change.     Past Medical History:  Diagnosis Date  . Fibrocystic breast disease   . Gastritis    EGD - schatzki's ring, gastritis and gastric polyps  . History of colon polyps   . Hypercholesterolemia   . Hypertension   . MAI (mycobacterium avium-intracellulare) infection ([place])   . Osteoporosis   . Reflux esophagitis    Past Surgical History:  Procedure Laterality Date  . ABDOMINAL HYSTERECTOMY  1998  . BREAST EXCISIONAL BIOPSY Bilateral 1970's   benign  . BREAST SURGERY     cyst(benign)  . COLONOSCOPY N/A 09/28/2014   Procedure: COLONOSCOPY;  Surgeon: Manya Silvas, MD;  Location: [facility] ENDOSCOPY;  Service: Endoscopy;  Laterality: N/A;  . ESOPHAGOGASTRODUODENOSCOPY N/A 09/28/2014   Procedure: ESOPHAGOGASTRODUODENOSCOPY (EGD);  Surgeon: Manya Silvas, MD;  Location: [facility] ENDOSCOPY;  Service: Endoscopy;  Laterality: N/A;  . excision of right axillary lipoma  10/07  . FOOT SURGERY Right   . HEMORRHOID SURGERY    . HERNIA REPAIR  10/07  . NASAL SEPTUM SURGERY     Family History  Problem Relation Age of Onset    . Heart disease Mother 105    heart attack  . Cancer Father     lung  . Stroke Brother   . Diabetes Brother   . Diabetes Sister   . Breast cancer Sister 57   Social History   Social History  . Marital status: Widowed    Spouse name: N/A  . Number of children: N/A  . Years of education: N/A   Social History Main Topics  . Smoking status: Never Smoker  . Smokeless tobacco: Never Used  . Alcohol use No  . Drug use: No  . Sexual activity: Not Asked   Other Topics Concern  . None   Social History Narrative  . None    Outpatient Encounter Prescriptions as of 03/22/2016  Medication Sig  . aspirin (ASPIRIN EC) 81 MG EC tablet Take 81 mg by mouth daily. Swallow whole.  Marland Kitchen Fluticasone-Salmeterol (ADVAIR) 250-50 MCG/DOSE AEPB Inhale 2 puffs into the lungs 2 (two) times daily.  Marland Kitchen LORazepam (ATIVAN) 0.5 MG tablet 1/2 tablet to be taken just prior to her scan.  . metoprolol succinate (TOPROL-XL) 25 MG 24 hr tablet Take 1 tablet (25 mg total) by mouth 2 (two) times daily.  . Multiple Vitamin (MULTIVITAMIN WITH MINERALS) TABS Take 1 tablet by mouth daily.  . Omega-3 Fatty Acids (FISH OIL) 1000 MG CAPS Take 3,000 mg  by mouth daily.  Marland Kitchen omeprazole (PRILOSEC) 20 MG capsule Take 1 capsule (20 mg total) by mouth 2 (two) times daily.  . rosuvastatin (CRESTOR) 10 MG tablet Take 1 tablet (10 mg total) by mouth daily.  Marland Kitchen albuterol (PROVENTIL HFA;VENTOLIN HFA) 108 (90 BASE) MCG/ACT inhaler Inhale 2 puffs into the lungs every 6 (six) hours as needed.   No facility-administered encounter medications on file as of 03/22/2016.     Review of Systems  Constitutional: Negative for appetite change and unexpected weight change.  HENT: Negative for congestion and sinus pressure.   Respiratory: Positive for chest tightness. Negative for cough and shortness of breath.   Cardiovascular: Positive for chest pain. Negative for palpitations and leg swelling.  Gastrointestinal: Negative for abdominal pain,  diarrhea, nausea and vomiting.  Genitourinary: Negative for difficulty urinating and dysuria.  Musculoskeletal: Negative for back pain and joint swelling.  Skin: Negative for color change and rash.  Neurological: Negative for dizziness, light-headedness and headaches.  Psychiatric/Behavioral: Negative for agitation and dysphoric mood.       Objective:     Blood pressure rechecked by me:  132/72  Physical Exam  Constitutional: She is oriented to person, place, and time. She appears well-developed and well-nourished. No distress.  HENT:  Nose: Nose normal.  Mouth/Throat: Oropharynx is clear and moist.  Eyes: Right eye exhibits no discharge. Left eye exhibits no discharge. No scleral icterus.  Neck: Neck supple. No thyromegaly present.  Cardiovascular: Normal rate and regular rhythm.   Pulmonary/Chest: Breath sounds normal. No accessory muscle usage. No tachypnea. No respiratory distress. She has no decreased breath sounds. She has no wheezes. She has no rhonchi. Right breast exhibits no inverted nipple, no mass, no nipple discharge and no tenderness (no axillary adenopathy). Left breast exhibits no inverted nipple, no mass, no nipple discharge and no tenderness (no axilarry adenopathy).  Abdominal: Soft. Bowel sounds are normal. There is no tenderness.  Musculoskeletal: She exhibits no edema or tenderness.  Lymphadenopathy:    She has no cervical adenopathy.  Neurological: She is alert and oriented to person, place, and time.  Skin: Skin is warm. No rash noted. No erythema.  Psychiatric: She has a normal mood and affect. Her behavior is normal.    BP 132/72   Pulse 70   Temp 98.1 F (36.7 C) (Oral)   Ht 5\' 4"  (1.626 m)   Wt 189 lb 12.8 oz (86.1 kg)   SpO2 94%   BMI 32.58 kg/m  Wt Readings from Last 3 Encounters:  03/22/16 189 lb 12.8 oz (86.1 kg)  02/17/15 174 lb 12 oz (79.3 kg)  10/14/14 172 lb (78 kg)     Lab Results  Component Value Date   WBC 5.8 03/22/2016   HGB  14.4 03/22/2016   HCT 43.2 03/22/2016   PLT 244.0 03/22/2016   GLUCOSE 103 (H) 03/22/2016   CHOL 167 03/22/2016   TRIG 137.0 03/22/2016   HDL 76.10 03/22/2016   LDLCALC 64 03/22/2016   ALT 18 03/22/2016   AST 22 03/22/2016   NA 141 03/22/2016   K 4.6 03/28/2016   CL 103 03/22/2016   CREATININE 0.83 03/22/2016   BUN 12 03/22/2016   CO2 34 (H) 03/22/2016   TSH 2.51 03/22/2016    Mr Brain W Wo Contrast  Result Date: 05/05/2015 CLINICAL DATA:  75 year old female with 11 mm midline skullbase meningioma suspected on 2015 MRI. Subsequent encounter. EXAM: MRI HEAD WITHOUT AND WITH CONTRAST TECHNIQUE: Multiplanar, multiecho pulse sequences of the  brain and surrounding structures were obtained without and with intravenous contrast. CONTRAST:  49mL MULTIHANCE GADOBENATE DIMEGLUMINE 529 MG/ML IV SOLN COMPARISON:  04/23/2014. FINDINGS: Major intracranial vascular flow voids are stable. Scattered small chronic lacunar infarcts in the cerebral white matter and right cerebellar hemisphere. No restricted diffusion or evidence of acute infarction. Unchanged small midline planum sphenoidalehomogeneously enhancing dural-based lesion measuring up to 11 mm diameter, 6 mm in thickness (series 11, image 22 and series 12, image 12 compatible with small meningioma. As before, no significant mass effect. No associated inferior frontal gyrus edema. No other areas of dural thickening or abnormal intracranial enhancement. No midline shift, mass effect, ventriculomegaly, acute intracranial hemorrhage. Cervicomedullary junction and pituitary are within normal limits. Negative visualized cervical spine. Stable gray and white matter signal. Visible internal auditory structures appear normal. Mastoids are clear. Stable mild paranasal sinus mucosal thickening. Stable orbit and scalp soft tissues. Normal bone marrow signal. IMPRESSION: 1. Stable small midline planum sphenoidale meningioma since 2015. This appears inconsequential  with no associated mass effect or edema. 2. No acute intracranial abnormality. Stable mild to moderate for age chronic small vessel disease. Electronically Signed   By: Genevie Ann M.D.   On: 05/05/2015 09:11       Assessment & Plan:   Problem List Items Addressed This Visit    Axillary pain   Relevant Orders   MM Digital Diagnostic Bilat   US BREAST LTD UNI LEFT INC AXILLA   US BREAST LTD UNI RIGHT INC AXILLA   Chest tightness    EKG - SR with no acute ischemic changes.  Given acid reflux better and given persistent intermittent chest tightness, will have cardiology evaluate with question of need for further evaluation and w/up.        Relevant Orders   EKG 12-Lead (Completed)   Ambulatory referral to Cardiology   Dysphagia    Planning for EGD next month.  Saw GI.  Back on nexium.  Symptoms are better.        Essential hypertension, benign    Blood pressure under good control.  Continue same medication regimen.  Follow pressures.  Follow metabolic panel.        Relevant Orders   CBC with Differential/Platelet (Completed)   Basic metabolic panel (Completed)   TSH (Completed)   GERD (gastroesophageal reflux disease)    Discussed with her today.  On nexium.  Planning for EGD next month.  Saw GI.  Currently doing better since back on nexium.        Headache    Occipital neuralgia.  Saw Dr Manuella Ghazi.  Meningioma stable.  No headache reported today.  Follow.       Health care maintenance    Physical today 03/22/16.  Mammogram 02/24/15 - Birads I.  Schedule f/u mammogram.  Colonoscopy 09/28/14 - internal hemorrhoids.  Recommended f/u mammogram in five years.        History of colonic polyps    Colonoscopy 5/16/6 - internal hemorrhoids.  Recommended f/u in five years.        Hypercholesterolemia    Low cholesterol diet and exercise.  On crestor.  Follow lipid panel and liver function tests.        Relevant Orders   Lipid panel (Completed)   Hepatic function panel (Completed)    Meningioma ([place])    Just evaluated by MRI.  Saw neurology.  No change.         Other Visit Diagnoses    Encounter for screening  mammogram for breast cancer    -  Primary   Relevant Orders   MM Digital Screening   Breast pain       Relevant Orders   MM Digital Diagnostic Bilat   US BREAST LTD UNI LEFT INC AXILLA   US BREAST LTD UNI RIGHT INC Noreene Filbert, MD

## 2016-03-22 NOTE — Assessment & Plan Note (Signed)
Just evaluated by MRI.  Saw neurology.  No change.

## 2016-03-22 NOTE — Progress Notes (Signed)
Pre visit review using our clinic review tool, if applicable. No additional management support is needed unless otherwise documented below in the visit note. 

## 2016-03-22 NOTE — Assessment & Plan Note (Signed)
Colonoscopy 5/16/6 - internal hemorrhoids.  Recommended f/u in five years.

## 2016-03-22 NOTE — Assessment & Plan Note (Signed)
Discussed with her today.  On nexium.  Planning for EGD next month.  Saw GI.  Currently doing better since back on nexium.

## 2016-03-22 NOTE — Assessment & Plan Note (Signed)
Low cholesterol diet and exercise.  On crestor.  Follow lipid panel and liver function tests.  

## 2016-03-22 NOTE — Assessment & Plan Note (Signed)
Physical today 03/22/16.  Mammogram 02/24/15 - Birads I.  Schedule f/u mammogram.  Colonoscopy 09/28/14 - internal hemorrhoids.  Recommended f/u mammogram in five years.

## 2016-03-23 ENCOUNTER — Other Ambulatory Visit: Payer: Self-pay | Admitting: Internal Medicine

## 2016-03-23 DIAGNOSIS — E875 Hyperkalemia: Secondary | ICD-10-CM

## 2016-03-23 NOTE — Progress Notes (Signed)
Order placed for f/u potassium check.  

## 2016-03-28 ENCOUNTER — Other Ambulatory Visit (INDEPENDENT_AMBULATORY_CARE_PROVIDER_SITE_OTHER): Payer: Medicare Other

## 2016-03-28 DIAGNOSIS — E875 Hyperkalemia: Secondary | ICD-10-CM

## 2016-03-28 LAB — POTASSIUM: Potassium: 4.6 mEq/L (ref 3.5–5.1)

## 2016-04-02 ENCOUNTER — Encounter: Payer: Self-pay | Admitting: Internal Medicine

## 2016-04-02 DIAGNOSIS — R0789 Other chest pain: Secondary | ICD-10-CM | POA: Insufficient documentation

## 2016-04-02 NOTE — Assessment & Plan Note (Signed)
EKG - SR with no acute ischemic changes.  Given acid reflux better and given persistent intermittent chest tightness, will have cardiology evaluate with question of need for further evaluation and w/up.

## 2016-04-02 NOTE — Assessment & Plan Note (Signed)
Planning for EGD next month.  Saw GI.  Back on nexium.  Symptoms are better.

## 2016-04-02 NOTE — Assessment & Plan Note (Signed)
Occipital neuralgia.  Saw Dr Manuella Ghazi.  Meningioma stable.  No headache reported today.  Follow.

## 2016-04-17 ENCOUNTER — Other Ambulatory Visit: Payer: Self-pay | Admitting: Internal Medicine

## 2016-04-17 ENCOUNTER — Ambulatory Visit
Admission: RE | Admit: 2016-04-17 | Discharge: 2016-04-17 | Disposition: A | Discharge status: Home / Self Care | Payer: Medicare Other | Source: Ambulatory Visit | Attending: Internal Medicine | Admitting: Internal Medicine

## 2016-04-17 ENCOUNTER — Telehealth: Payer: Self-pay | Admitting: Internal Medicine

## 2016-04-17 DIAGNOSIS — R928 Other abnormal and inconclusive findings on diagnostic imaging of breast: Secondary | ICD-10-CM | POA: Diagnosis not present

## 2016-04-17 DIAGNOSIS — M79629 Pain in unspecified upper arm: Secondary | ICD-10-CM

## 2016-04-17 DIAGNOSIS — R2232 Localized swelling, mass and lump, left upper limb: Secondary | ICD-10-CM | POA: Diagnosis not present

## 2016-04-17 DIAGNOSIS — N644 Mastodynia: Secondary | ICD-10-CM

## 2016-04-17 DIAGNOSIS — R2231 Localized swelling, mass and lump, right upper limb: Secondary | ICD-10-CM | POA: Diagnosis not present

## 2016-04-17 NOTE — Telephone Encounter (Signed)
Pt declined to get the AWV. Thank you! °

## 2016-04-25 DIAGNOSIS — I714 Abdominal aortic aneurysm, without rupture: Secondary | ICD-10-CM | POA: Diagnosis not present

## 2016-04-25 DIAGNOSIS — I208 Other forms of angina pectoris: Secondary | ICD-10-CM | POA: Diagnosis not present

## 2016-04-25 DIAGNOSIS — Z01818 Encounter for other preprocedural examination: Secondary | ICD-10-CM | POA: Diagnosis not present

## 2016-04-25 DIAGNOSIS — R079 Chest pain, unspecified: Secondary | ICD-10-CM | POA: Diagnosis not present

## 2016-04-26 ENCOUNTER — Ambulatory Visit: Payer: Medicare Other | Admitting: Anesthesiology

## 2016-04-26 ENCOUNTER — Ambulatory Visit
Admission: RE | Admit: 2016-04-26 | Discharge: 2016-04-26 | Disposition: A | Discharge status: Home / Self Care | Payer: Medicare Other | Source: Ambulatory Visit | Attending: Unknown Physician Specialty | Admitting: Unknown Physician Specialty

## 2016-04-26 ENCOUNTER — Ambulatory Visit: Payer: Medicare Other

## 2016-04-26 ENCOUNTER — Encounter: Payer: Self-pay | Admitting: Anesthesiology

## 2016-04-26 ENCOUNTER — Encounter
Admission: RE | Disposition: A | Discharge status: Home / Self Care | Payer: Self-pay | Source: Ambulatory Visit | Attending: Unknown Physician Specialty

## 2016-04-26 DIAGNOSIS — K219 Gastro-esophageal reflux disease without esophagitis: Secondary | ICD-10-CM | POA: Diagnosis not present

## 2016-04-26 DIAGNOSIS — K449 Diaphragmatic hernia without obstruction or gangrene: Secondary | ICD-10-CM | POA: Diagnosis not present

## 2016-04-26 DIAGNOSIS — K222 Esophageal obstruction: Secondary | ICD-10-CM | POA: Diagnosis not present

## 2016-04-26 DIAGNOSIS — J45909 Unspecified asthma, uncomplicated: Secondary | ICD-10-CM | POA: Insufficient documentation

## 2016-04-26 DIAGNOSIS — Z7982 Long term (current) use of aspirin: Secondary | ICD-10-CM | POA: Diagnosis not present

## 2016-04-26 DIAGNOSIS — I10 Essential (primary) hypertension: Secondary | ICD-10-CM | POA: Diagnosis not present

## 2016-04-26 DIAGNOSIS — E7800 Pure hypercholesterolemia, unspecified: Secondary | ICD-10-CM | POA: Diagnosis not present

## 2016-04-26 DIAGNOSIS — K2970 Gastritis, unspecified, without bleeding: Secondary | ICD-10-CM | POA: Insufficient documentation

## 2016-04-26 DIAGNOSIS — R1310 Dysphagia, unspecified: Secondary | ICD-10-CM | POA: Diagnosis present

## 2016-04-26 DIAGNOSIS — Z79899 Other long term (current) drug therapy: Secondary | ICD-10-CM | POA: Insufficient documentation

## 2016-04-26 DIAGNOSIS — K2960 Other gastritis without bleeding: Secondary | ICD-10-CM | POA: Diagnosis not present

## 2016-04-26 DIAGNOSIS — K2950 Unspecified chronic gastritis without bleeding: Secondary | ICD-10-CM | POA: Diagnosis not present

## 2016-04-26 HISTORY — PX: ESOPHAGOGASTRODUODENOSCOPY (EGD) WITH PROPOFOL: SHX5813

## 2016-04-26 SURGERY — ESOPHAGOGASTRODUODENOSCOPY (EGD) WITH PROPOFOL
Anesthesia: General

## 2016-04-26 MED ORDER — SODIUM CHLORIDE 0.9 % IV SOLN
INTRAVENOUS | Status: DC
  Administered 2016-04-26: 1000 mL via INTRAVENOUS

## 2016-04-26 MED ORDER — MIDAZOLAM HCL 5 MG/5ML IJ SOLN
INTRAMUSCULAR | Status: DC | PRN
  Administered 2016-04-26: 1 mg via INTRAVENOUS

## 2016-04-26 MED ORDER — PROPOFOL 500 MG/50ML IV EMUL
INTRAVENOUS | Status: DC | PRN
  Administered 2016-04-26: 100 ug/kg/min via INTRAVENOUS

## 2016-04-26 MED ORDER — GLYCOPYRROLATE 0.2 MG/ML IJ SOLN
INTRAMUSCULAR | Status: DC | PRN
  Administered 2016-04-26: 0.2 mg via INTRAVENOUS

## 2016-04-26 MED ORDER — PROPOFOL 10 MG/ML IV BOLUS
INTRAVENOUS | Status: DC | PRN
  Administered 2016-04-26 (×2): 20 mg via INTRAVENOUS

## 2016-04-26 MED ORDER — SODIUM CHLORIDE 0.9 % IV SOLN: INTRAVENOUS | Status: DC

## 2016-04-26 MED ORDER — FENTANYL CITRATE (PF) 100 MCG/2ML IJ SOLN
INTRAMUSCULAR | Status: DC | PRN
  Administered 2016-04-26: 50 ug via INTRAVENOUS

## 2016-04-26 MED ORDER — LIDOCAINE HCL (PF) 2 % IJ SOLN
INTRAMUSCULAR | Status: DC | PRN
  Administered 2016-04-26: 50 mg

## 2016-04-26 NOTE — H&P (Signed)
Primary Care Physician:  Einar Pheasant, MD Primary Gastroenterologist:  Dr. Vira Agar  Pre-Procedure History & Physical: HPI:  Angela Cohen is a 75 y.o. female is here for an endoscopy.   Past Medical History:  Diagnosis Date  . Fibrocystic breast disease   . Gastritis    EGD - schatzki's ring, gastritis and gastric polyps  . History of colon polyps   . Hypercholesterolemia   . Hypertension   . MAI (mycobacterium avium-intracellulare) infection ([place])   . Osteoporosis   . Reflux esophagitis     Past Surgical History:  Procedure Laterality Date  . ABDOMINAL HYSTERECTOMY  1998  . BREAST EXCISIONAL BIOPSY Bilateral 1970's   benign  . BREAST EXCISIONAL BIOPSY Bilateral 2010   pt  stated dr Tamala Julian did bilat axilla lumps neg   . BREAST SURGERY     cyst(benign)  . COLONOSCOPY N/A 09/28/2014   Procedure: COLONOSCOPY;  Surgeon: Manya Silvas, MD;  Location: [facility] ENDOSCOPY;  Service: Endoscopy;  Laterality: N/A;  . ESOPHAGOGASTRODUODENOSCOPY N/A 09/28/2014   Procedure: ESOPHAGOGASTRODUODENOSCOPY (EGD);  Surgeon: Manya Silvas, MD;  Location: [facility] ENDOSCOPY;  Service: Endoscopy;  Laterality: N/A;  . excision of right axillary lipoma  10/07  . FOOT SURGERY Right   . HEMORRHOID SURGERY    . HERNIA REPAIR  10/07  . NASAL SEPTUM SURGERY      Prior to Admission medications   Medication Sig Start Date End Date Taking? Authorizing Provider  aspirin (ASPIRIN EC) 81 MG EC tablet Take 81 mg by mouth daily. Swallow whole.   Yes Historical Provider, MD  Fluticasone-Salmeterol (ADVAIR) 250-50 MCG/DOSE AEPB Inhale 2 puffs into the lungs 2 (two) times daily.   Yes Historical Provider, MD  LORazepam (ATIVAN) 0.5 MG tablet 1/2 tablet to be taken just prior to her scan. 02/24/15  Yes Einar Pheasant, MD  metoprolol succinate (TOPROL-XL) 25 MG 24 hr tablet Take 1 tablet (25 mg total) by mouth 2 (two) times daily. 03/06/16  Yes Einar Pheasant, MD  Multiple Vitamin (MULTIVITAMIN WITH MINERALS)  TABS Take 1 tablet by mouth daily.   Yes Historical Provider, MD  Omega-3 Fatty Acids (FISH OIL) 1000 MG CAPS Take 3,000 mg by mouth daily. 12/29/08  Yes Historical Provider, MD  omeprazole (PRILOSEC) 20 MG capsule Take 1 capsule (20 mg total) by mouth 2 (two) times daily. 02/19/15  Yes Einar Pheasant, MD  rosuvastatin (CRESTOR) 10 MG tablet Take 1 tablet (10 mg total) by mouth daily. 03/06/16  Yes Einar Pheasant, MD  albuterol (PROVENTIL HFA;VENTOLIN HFA) 108 (90 BASE) MCG/ACT inhaler Inhale 2 puffs into the lungs every 6 (six) hours as needed. 03/27/14 03/28/15  Historical Provider, MD    Allergies as of 04/12/2016  . (No Known Allergies)    Family History  Problem Relation Age of Onset  . Heart disease Mother 42    heart attack  . Cancer Father     lung  . Stroke Brother   . Diabetes Brother   . Diabetes Sister   . Breast cancer Sister 68    Social History   Social History  . Marital status: Widowed    Spouse name: N/A  . Number of children: N/A  . Years of education: N/A   Occupational History  . Not on file.   Social History Main Topics  . Smoking status: Never Smoker  . Smokeless tobacco: Never Used  . Alcohol use No  . Drug use: No  . Sexual activity: Not on file  Other Topics Concern  . Not on file   Social History Narrative  . No narrative on file    Review of Systems: See HPI, otherwise negative ROS  Physical Exam: BP 139/73   Pulse 72   Temp (!) 95.9 F (35.5 C) (Tympanic)   Resp 20   Ht 5\' 4"  (1.626 m)   Wt 81.6 kg (180 lb)   SpO2 100%   BMI 30.90 kg/m  General:   Alert,  pleasant and cooperative in NAD Head:  Normocephalic and atraumatic. Neck:  Supple; no masses or thyromegaly. Lungs:  Clear throughout to auscultation.    Heart:  Regular rate and rhythm. Abdomen:  Soft, nontender and nondistended. Normal bowel sounds, without guarding, and without rebound.   Neurologic:  Alert and  oriented x4;  grossly normal  neurologically.  Impression/Plan: Angela Cohen is here for an endoscopy to be performed for dysphagia  Risks, benefits, limitations, and alternatives regarding  endoscopy have been reviewed with the patient.  Questions have been answered.  All parties agreeable.   Gaylyn Cheers, MD  04/26/2016, 10:09 AM

## 2016-04-26 NOTE — Transfer of Care (Signed)
Immediate Anesthesia Transfer of Care Note  Patient: Angela Cohen  Procedure(s) Performed: Procedure(s): ESOPHAGOGASTRODUODENOSCOPY (EGD) WITH PROPOFOL (N/A)  Patient Location: PACU  Anesthesia Type:General  Level of Consciousness: sedated  Airway & Oxygen Therapy: Patient Spontanous Breathing and Patient connected to nasal cannula oxygen  Post-op Assessment: Report given to RN and Post -op Vital signs reviewed and stable  Post vital signs: Reviewed and stable  Last Vitals:  Vitals:   04/26/16 0918  BP: 139/73  Pulse: 72  Resp: 20  Temp: (!) 35.5 C    Last Pain:  Vitals:   04/26/16 0918  TempSrc: Tympanic         Complications: No apparent anesthesia complications

## 2016-04-26 NOTE — Anesthesia Preprocedure Evaluation (Signed)
Anesthesia Evaluation  Patient identified by MRN, date of birth, ID band Patient awake    Reviewed: Allergy & Precautions, NPO status , Patient's Chart, lab work & pertinent test results  History of Anesthesia Complications (+) PONV and history of anesthetic complications  Airway Mallampati: III  TM Distance: >3 FB Neck ROM: Full    Dental  (+) Partial Lower, Partial Upper   Pulmonary neg shortness of breath, asthma (lst inhaler 1 week ago) , neg sleep apnea, neg recent URI,           Cardiovascular Exercise Tolerance: Good hypertension, Pt. on home beta blockers (-) angina(-) CAD, (-) Past MI, (-) Cardiac Stents and (-) CABG + dysrhythmias (unknown dysrhymia) (-) Valvular Problems/Murmurs     Neuro/Psych neg Headaches,    GI/Hepatic GERD  Medicated and Poorly Controlled,  Endo/Other    Renal/GU      Musculoskeletal   Abdominal   Peds  Hematology   Anesthesia Other Findings Past Medical History: No date: Fibrocystic breast disease No date: Gastritis     Comment: EGD - schatzki's ring, gastritis and gastric               polyps No date: History of colon polyps No date: Hypercholesterolemia No date: Hypertension No date: MAI (mycobacterium avium-intracellulare) infec* No date: Osteoporosis No date: Reflux esophagitis   Reproductive/Obstetrics negative OB ROS                             Anesthesia Physical  Anesthesia Plan  ASA: III  Anesthesia Plan: General   Post-op Pain Management:    Induction: Intravenous  Airway Management Planned: Nasal Cannula  Additional Equipment:   Intra-op Plan:   Post-operative Plan:   Informed Consent: I have reviewed the patients History and Physical, chart, labs and discussed the procedure including the risks, benefits and alternatives for the proposed anesthesia with the patient or authorized representative who has indicated his/her  understanding and acceptance.     Plan Discussed with:   Anesthesia Plan Comments:         Anesthesia Quick Evaluation  

## 2016-04-26 NOTE — Op Note (Signed)
[facility] Gastroenterology Patient Name: Angela Cohen Procedure Date: 04/26/2016 10:10 AM MRN: [id number] Account #: [id number] Date of Birth: 1940-11-19 Admit Type: Outpatient Age: 75 Room: [facility] ENDO ROOM 4 Gender: Female Note Status: Finalized Procedure:            Upper GI endoscopy Indications:          Dysphagia Providers:            Manya Silvas, MD Referring MD:         Einar Pheasant, MD (Referring MD) Medicines:            Propofol per Anesthesia Complications:        No immediate complications. Procedure:            Pre-Anesthesia Assessment:                       - After reviewing the risks and benefits, the patient                        was deemed in satisfactory condition to undergo the                        procedure.                       After obtaining informed consent, the endoscope was                        passed under direct vision. Throughout the procedure,                        the patient's blood pressure, pulse, and oxygen                        saturations were monitored continuously. The Endoscope                        was introduced through the mouth, and advanced to the                        second part of duodenum. The upper GI endoscopy was                        accomplished without difficulty. The patient tolerated                        the procedure well. Findings:      A moderate Schatzki ring (acquired) was found in the distal esophagus.       At the end of the procedure A guidewire was placed and the scope was       withdrawn. Dilation was performed with a Savary dilator with moderate       resistance at 17 mm.      A medium-sized hiatal hernia was present.      Localized moderate inflammation characterized by erythema and       granularity was found in the gastric antrum. Biopsies were taken with a       cold forceps for histology. Biopsies were taken with a cold forceps for       Helicobacter pylori  testing.      The examined duodenum was normal.  Impression:           - Moderate Schatzki ring. Dilated.                       - Medium-sized hiatal hernia.                       - Gastritis. Biopsied.                       - Normal examined duodenum. Recommendation:       - Await pathology results.                       - soft food for 3 days, eat slowly, chew well, take                        small bites Manya Silvas, MD 04/26/2016 10:27:37 AM This report has been signed electronically. Number of Addenda: 0 Note Initiated On: 04/26/2016 10:10 AM      [facility]

## 2016-04-27 ENCOUNTER — Encounter: Payer: Self-pay | Admitting: Unknown Physician Specialty

## 2016-04-28 ENCOUNTER — Encounter: Payer: Self-pay | Admitting: Internal Medicine

## 2016-04-28 LAB — SURGICAL PATHOLOGY

## 2016-04-28 NOTE — Anesthesia Postprocedure Evaluation (Signed)
Anesthesia Post Note  Patient: Raguel H Leaman  Procedure(s) Performed: Procedure(s) (LRB): ESOPHAGOGASTRODUODENOSCOPY (EGD) WITH PROPOFOL (N/A)  Patient location during evaluation: Endoscopy Anesthesia Type: General Level of consciousness: awake and alert Pain management: pain level controlled Vital Signs Assessment: post-procedure vital signs reviewed and stable Respiratory status: spontaneous breathing, nonlabored ventilation, respiratory function stable and patient connected to nasal cannula oxygen Cardiovascular status: blood pressure returned to baseline and stable Postop Assessment: no signs of nausea or vomiting Anesthetic complications: no    Last Vitals:  Vitals:   04/26/16 1058 04/26/16 1108  BP: 131/66 (!) 139/59  Pulse: 71 67  Resp: 16 16  Temp:      Last Pain:  Vitals:   04/27/16 0802  TempSrc:   PainSc: 0-No pain                 Martha Clan

## 2016-05-19 DIAGNOSIS — R05 Cough: Secondary | ICD-10-CM | POA: Diagnosis not present

## 2016-09-04 ENCOUNTER — Telehealth: Payer: Self-pay | Admitting: *Deleted

## 2016-09-04 MED ORDER — METOPROLOL SUCCINATE ER 25 MG PO TB24: 25.0000 mg | ORAL_TABLET | Freq: Two times a day (BID) | ORAL | 0 refills | Status: DC

## 2016-09-04 NOTE — Telephone Encounter (Signed)
ok'd refill for metoprolol x 1.  Needs a f/u appt scheduled.

## 2016-09-04 NOTE — Telephone Encounter (Signed)
Medication: Metoprolol 25 mg   Directions:1  Po bid  Last given: 03/06/16 #180 Number refills: 0 Last o/v: 03/22/16 Follow up: N/a

## 2016-09-04 NOTE — Telephone Encounter (Signed)
Patient requested a medication refill for metoprolol  Pharmacy [facility] mail service  Pt contact [phone]

## 2016-09-04 NOTE — Telephone Encounter (Signed)
Patient states she comes once a year for her physical and that is all she comes in for she will not make follow up and would like you to refill mediations for more than one month. Informed patient that we would not receive answer until the morning you are out of office for the afternoon

## 2016-09-05 ENCOUNTER — Other Ambulatory Visit: Payer: Self-pay

## 2016-09-05 MED ORDER — OMEPRAZOLE 20 MG PO CPDR: 20.0000 mg | DELAYED_RELEASE_CAPSULE | Freq: Two times a day (BID) | ORAL | 1 refills | Status: DC

## 2016-09-05 MED ORDER — ROSUVASTATIN CALCIUM 10 MG PO TABS: 10.0000 mg | ORAL_TABLET | Freq: Every day | ORAL | 1 refills | Status: DC

## 2016-09-05 NOTE — Telephone Encounter (Signed)
App made for physical with patient letter mailed

## 2016-09-05 NOTE — Telephone Encounter (Signed)
The rx was [facility] to [facility] for [facility] months.  When due for another refill, will refill until her cpe.  she will need to make appt for cpe - due in 03/2017.

## 2017-01-18 ENCOUNTER — Telehealth: Payer: Self-pay | Admitting: Internal Medicine

## 2017-01-18 DIAGNOSIS — Z1239 Encounter for other screening for malignant neoplasm of breast: Secondary | ICD-10-CM

## 2017-01-18 NOTE — Telephone Encounter (Signed)
Appt made patient informed. Gave contact information if time needed to be changed.

## 2017-01-18 NOTE — Telephone Encounter (Signed)
Pt called would like to have her mammo done. Need order please and thank you! Pt would like to go to [place].  Call pt@ [phone].

## 2017-03-05 ENCOUNTER — Telehealth: Payer: Self-pay | Admitting: Internal Medicine

## 2017-03-05 ENCOUNTER — Other Ambulatory Visit: Payer: Self-pay

## 2017-03-05 MED ORDER — METOPROLOL SUCCINATE ER 25 MG PO TB24: 25.0000 mg | ORAL_TABLET | Freq: Two times a day (BID) | ORAL | 0 refills | Status: DC

## 2017-03-05 NOTE — Telephone Encounter (Signed)
One refill called in to last until follow up in November.

## 2017-03-05 NOTE — Telephone Encounter (Signed)
metoprolol succinate (TOPROL-XL) 25 MG 24 hr tablet  #180

## 2017-03-10 DIAGNOSIS — J019 Acute sinusitis, unspecified: Secondary | ICD-10-CM | POA: Diagnosis not present

## 2017-03-10 DIAGNOSIS — B9689 Other specified bacterial agents as the cause of diseases classified elsewhere: Secondary | ICD-10-CM | POA: Diagnosis not present

## 2017-04-09 ENCOUNTER — Encounter: Payer: Self-pay | Admitting: Internal Medicine

## 2017-04-09 ENCOUNTER — Other Ambulatory Visit (HOSPITAL_COMMUNITY)
Admission: RE | Admit: 2017-04-09 | Discharge: 2017-04-09 | Disposition: A | Discharge status: Home / Self Care | Payer: Medicare Other | Source: Ambulatory Visit | Attending: Internal Medicine | Admitting: Internal Medicine

## 2017-04-09 ENCOUNTER — Ambulatory Visit (INDEPENDENT_AMBULATORY_CARE_PROVIDER_SITE_OTHER): Payer: Medicare Other | Admitting: Internal Medicine

## 2017-04-09 VITALS — BP 128/74 | HR 74 | Temp 98.0°F | Ht 64.0 in | Wt 181.0 lb

## 2017-04-09 DIAGNOSIS — E7800 Pure hypercholesterolemia, unspecified: Secondary | ICD-10-CM | POA: Diagnosis not present

## 2017-04-09 DIAGNOSIS — I10 Essential (primary) hypertension: Secondary | ICD-10-CM | POA: Diagnosis not present

## 2017-04-09 DIAGNOSIS — R102 Pelvic and perineal pain: Secondary | ICD-10-CM | POA: Insufficient documentation

## 2017-04-09 DIAGNOSIS — D329 Benign neoplasm of meninges, unspecified: Secondary | ICD-10-CM | POA: Diagnosis not present

## 2017-04-09 DIAGNOSIS — Z0000 Encounter for general adult medical examination without abnormal findings: Secondary | ICD-10-CM | POA: Diagnosis not present

## 2017-04-09 DIAGNOSIS — A310 Pulmonary mycobacterial infection: Secondary | ICD-10-CM

## 2017-04-09 DIAGNOSIS — K2970 Gastritis, unspecified, without bleeding: Secondary | ICD-10-CM | POA: Diagnosis not present

## 2017-04-09 DIAGNOSIS — R1310 Dysphagia, unspecified: Secondary | ICD-10-CM

## 2017-04-09 DIAGNOSIS — K219 Gastro-esophageal reflux disease without esophagitis: Secondary | ICD-10-CM | POA: Diagnosis not present

## 2017-04-09 DIAGNOSIS — Z1151 Encounter for screening for human papillomavirus (HPV): Secondary | ICD-10-CM

## 2017-04-09 LAB — URINALYSIS, ROUTINE W REFLEX MICROSCOPIC
Bilirubin Urine: NEGATIVE
Ketones, ur: NEGATIVE
Leukocytes, UA: NEGATIVE
Nitrite: NEGATIVE
PH: 6 (ref 5.0–8.0)
SPECIFIC GRAVITY, URINE: 1.025 (ref 1.000–1.030)
TOTAL PROTEIN, URINE-UPE24: NEGATIVE
URINE GLUCOSE: NEGATIVE
Urobilinogen, UA: 0.2 (ref 0.0–1.0)

## 2017-04-09 LAB — HEPATIC FUNCTION PANEL
ALT: 25 U/L (ref 0–35)
AST: 18 U/L (ref 0–37)
Albumin: 4.3 g/dL (ref 3.5–5.2)
Alkaline Phosphatase: 85 U/L (ref 39–117)
BILIRUBIN DIRECT: 0.1 mg/dL (ref 0.0–0.3)
Total Bilirubin: 0.5 mg/dL (ref 0.2–1.2)
Total Protein: 7.8 g/dL (ref 6.0–8.3)

## 2017-04-09 LAB — CBC WITH DIFFERENTIAL/PLATELET
BASOS ABS: 0.1 10*3/uL (ref 0.0–0.1)
Basophils Relative: 0.9 % (ref 0.0–3.0)
EOS ABS: 0.3 10*3/uL (ref 0.0–0.7)
Eosinophils Relative: 5.9 % — ABNORMAL HIGH (ref 0.0–5.0)
HEMATOCRIT: 43.9 % (ref 36.0–46.0)
Hemoglobin: 15 g/dL (ref 12.0–15.0)
LYMPHS ABS: 1.6 10*3/uL (ref 0.7–4.0)
LYMPHS PCT: 28.3 % (ref 12.0–46.0)
MCHC: 34.1 g/dL (ref 30.0–36.0)
MCV: 96.3 fl (ref 78.0–100.0)
MONOS PCT: 7.8 % (ref 3.0–12.0)
Monocytes Absolute: 0.4 10*3/uL (ref 0.1–1.0)
NEUTROS ABS: 3.2 10*3/uL (ref 1.4–7.7)
NEUTROS PCT: 57.1 % (ref 43.0–77.0)
PLATELETS: 239 10*3/uL (ref 150.0–400.0)
RBC: 4.56 Mil/uL (ref 3.87–5.11)
RDW: 13 % (ref 11.5–15.5)
WBC: 5.6 10*3/uL (ref 4.0–10.5)

## 2017-04-09 LAB — BASIC METABOLIC PANEL
BUN: 15 mg/dL (ref 6–23)
CALCIUM: 9.3 mg/dL (ref 8.4–10.5)
CO2: 33 mEq/L — ABNORMAL HIGH (ref 19–32)
CREATININE: 0.85 mg/dL (ref 0.40–1.20)
Chloride: 102 mEq/L (ref 96–112)
GFR: 69.25 mL/min (ref >60.00)
Glucose, Bld: 113 mg/dL — ABNORMAL HIGH (ref 70–99)
Potassium: 5 mEq/L (ref 3.5–5.1)
Sodium: 140 mEq/L (ref 135–145)

## 2017-04-09 LAB — LIPID PANEL
CHOL/HDL RATIO: 2
Cholesterol: 196 mg/dL (ref 0–200)
HDL: 83.7 mg/dL (ref >39.00)
LDL Cholesterol: 88 mg/dL (ref 0–99)
NonHDL: 111.88
Triglycerides: 117 mg/dL (ref 0.0–149.0)
VLDL: 23.4 mg/dL (ref 0.0–40.0)

## 2017-04-09 LAB — TSH: TSH: 2.53 u[IU]/mL (ref 0.35–4.50)

## 2017-04-09 MED ORDER — PANTOPRAZOLE SODIUM 40 MG PO TBEC: 40.0000 mg | DELAYED_RELEASE_TABLET | Freq: Two times a day (BID) | ORAL | 1 refills | Status: DC

## 2017-04-09 NOTE — Patient Instructions (Signed)
Stop omeprazole.  Start protonix 40mg  twice a day.  Take one 30 minutes before breakfast and one 30 minutes before evening meal.

## 2017-04-09 NOTE — Assessment & Plan Note (Addendum)
Physical today 04/09/17.  Mammogram 04/17/16 - Birads I.  Scheduled for f/u mammogram.  colonoscopy 09/28/14 - internal hemorrhoids.  Recommended f/u in five years.  Pt requested pap - pap 04/09/17.

## 2017-04-09 NOTE — Progress Notes (Signed)
Patient ID: KATHRENE SINOPOLI, female   DOB: February 07, 1941, 76 y.o.   MRN: [id number]   Subjective:    Patient ID: KARISSA MEENAN, female    DOB: 12-02-1940, 76 y.o.   MRN: [id number]  HPI  Patient with past history of hypercholesterolemia, gastritis and hypertension.  She comes in today to follow up on these issues as well as for a complete physical exam.  She reports having problems with increased acid reflux.  Some dysphagia.  Taking omeprazole.  Discussed diet modification and not eating late.  Also discussed changing medication and need for f/u with GI. No chest pain.  Breathing stable.  No abdominal pain.  Bowels moving.  She has seen Dr Jacqlyn Larsen.  S/p bladder surgery.  Does have some persistent leakage.  Has noticed sharp pain up in vagina.  No discharge.  No bleeding.  Has had a hysterectomy for endometriosis.  No nausea or vomiting.     Past Medical History:  Diagnosis Date  . Fibrocystic breast disease   . Gastritis    EGD - schatzki's ring, gastritis and gastric polyps  . History of colon polyps   . Hypercholesterolemia   . Hypertension   . MAI (mycobacterium avium-intracellulare) infection ([place])   . Osteoporosis   . Reflux esophagitis    Past Surgical History:  Procedure Laterality Date  . ABDOMINAL HYSTERECTOMY  1998  . BREAST EXCISIONAL BIOPSY Bilateral 1970's   benign  . BREAST EXCISIONAL BIOPSY Bilateral 2010   pt  stated dr Tamala Julian did bilat axilla lumps neg   . BREAST SURGERY     cyst(benign)  . COLONOSCOPY N/A 09/28/2014   Procedure: COLONOSCOPY;  Surgeon: Manya Silvas, MD;  Location: [facility] ENDOSCOPY;  Service: Endoscopy;  Laterality: N/A;  . ESOPHAGOGASTRODUODENOSCOPY N/A 09/28/2014   Procedure: ESOPHAGOGASTRODUODENOSCOPY (EGD);  Surgeon: Manya Silvas, MD;  Location: [facility] ENDOSCOPY;  Service: Endoscopy;  Laterality: N/A;  . ESOPHAGOGASTRODUODENOSCOPY (EGD) WITH PROPOFOL N/A 04/26/2016   Procedure: ESOPHAGOGASTRODUODENOSCOPY (EGD) WITH PROPOFOL;  Surgeon: Manya Silvas, MD;  Location: [facility] ENDOSCOPY;  Service: Endoscopy;  Laterality: N/A;  . excision of right axillary lipoma  10/07  . FOOT SURGERY Right   . HEMORRHOID SURGERY    . HERNIA REPAIR  10/07  . NASAL SEPTUM SURGERY     Family History  Problem Relation Age of Onset  . Heart disease Mother 71       heart attack  . Cancer Father        lung  . Stroke Brother   . Diabetes Brother   . Diabetes Sister   . Breast cancer Sister 33   Social History   Socioeconomic History  . Marital status: Widowed    Spouse name: None  . Number of children: None  . Years of education: None  . Highest education level: None  Social Needs  . Financial resource strain: None  . Food insecurity - worry: None  . Food insecurity - inability: None  . Transportation needs - medical: None  . Transportation needs - non-medical: None  Occupational History  . None  Tobacco Use  . Smoking status: Never Smoker  . Smokeless tobacco: Never Used  Substance and Sexual Activity  . Alcohol use: No    Alcohol/week: 0.0 oz  . Drug use: No  . Sexual activity: None  Other Topics Concern  . None  Social History Narrative  . None    Outpatient Encounter Medications as of 04/09/2017  Medication Sig  . aspirin (ASPIRIN  EC) 81 MG EC tablet Take 81 mg by mouth daily. Swallow whole.  Marland Kitchen Fluticasone-Salmeterol (ADVAIR) 250-50 MCG/DOSE AEPB Inhale 2 puffs into the lungs 2 (two) times daily.  Marland Kitchen LORazepam (ATIVAN) 0.5 MG tablet 1/2 tablet to be taken just prior to her scan.  . metoprolol succinate (TOPROL-XL) 25 MG 24 hr tablet Take 1 tablet (25 mg total) by mouth 2 (two) times daily.  . Multiple Vitamin (MULTIVITAMIN WITH MINERALS) TABS Take 1 tablet by mouth daily.  . Omega-3 Fatty Acids (FISH OIL) 1000 MG CAPS Take 3,000 mg by mouth daily.  Marland Kitchen omeprazole (PRILOSEC) 20 MG capsule Take 1 capsule (20 mg total) by mouth 2 (two) times daily.  . rosuvastatin (CRESTOR) 10 MG tablet Take 1 tablet (10 mg total) by mouth  daily.  Marland Kitchen albuterol (PROVENTIL HFA;VENTOLIN HFA) 108 (90 BASE) MCG/ACT inhaler Inhale 2 puffs into the lungs every 6 (six) hours as needed.  . pantoprazole (PROTONIX) 40 MG tablet Take 1 tablet (40 mg total) by mouth 2 (two) times daily before a meal.   No facility-administered encounter medications on file as of 04/09/2017.     Review of Systems  Constitutional: Negative for appetite change and unexpected weight change.  HENT: Negative for congestion, sinus pressure and sore throat.   Eyes: Negative for pain and visual disturbance.  Respiratory: Negative for cough, chest tightness and shortness of breath.   Cardiovascular: Negative for chest pain, palpitations and leg swelling.  Gastrointestinal: Negative for abdominal pain, diarrhea, nausea and vomiting.       Acid reflux as outlined.  Dysphagia.   Genitourinary: Negative for difficulty urinating and dysuria.       Vaginal pain as outlined.    Musculoskeletal: Negative for joint swelling and myalgias.  Skin: Negative for color change and rash.  Neurological: Negative for dizziness, light-headedness and headaches.  Hematological: Negative for adenopathy. Does not bruise/bleed easily.  Psychiatric/Behavioral: Negative for agitation and dysphoric mood.       Objective:    Physical Exam  Constitutional: She is oriented to person, place, and time. She appears well-developed and well-nourished. No distress.  HENT:  Nose: Nose normal.  Mouth/Throat: Oropharynx is clear and moist.  Eyes: Right eye exhibits no discharge. Left eye exhibits no discharge. No scleral icterus.  Neck: Neck supple. No thyromegaly present.  Cardiovascular: Normal rate and regular rhythm.  Pulmonary/Chest: Breath sounds normal. No accessory muscle usage. No tachypnea. No respiratory distress. She has no decreased breath sounds. She has no wheezes. She has no rhonchi. Right breast exhibits no inverted nipple, no mass, no nipple discharge and no tenderness (no  axillary adenopathy). Left breast exhibits no inverted nipple, no mass, no nipple discharge and no tenderness (no axilarry adenopathy).  Abdominal: Soft. Bowel sounds are normal. There is no tenderness.  Genitourinary:  Genitourinary Comments: Normal external genitalia.  Vaginal vault without lesions.  S/p hysterectomy.  Atrophy changes present.  Pap smear performed.  Could not appreciate any adnexal masses or tenderness.    Musculoskeletal: She exhibits no edema or tenderness.  Lymphadenopathy:    She has no cervical adenopathy.  Neurological: She is alert and oriented to person, place, and time.  Skin: Skin is warm. No rash noted. No erythema.  Psychiatric: She has a normal mood and affect. Her behavior is normal.    BP 128/74 (BP Location: Left Arm, Patient Position: Sitting, Cuff Size: Normal)   Pulse 74   Temp 98 F (36.7 C) (Oral)   Ht 5\' 4"  (1.626 m)  Wt 181 lb (82.1 kg)   SpO2 99%   BMI 31.07 kg/m  Wt Readings from Last 3 Encounters:  04/09/17 181 lb (82.1 kg)  04/26/16 180 lb (81.6 kg)  03/22/16 189 lb 12.8 oz (86.1 kg)     Lab Results  Component Value Date   WBC 5.6 04/09/2017   HGB 15.0 04/09/2017   HCT 43.9 04/09/2017   PLT 239.0 04/09/2017   GLUCOSE 113 (H) 04/09/2017   CHOL 196 04/09/2017   TRIG 117.0 04/09/2017   HDL 83.70 04/09/2017   LDLCALC 88 04/09/2017   ALT 25 04/09/2017   AST 18 04/09/2017   NA 140 04/09/2017   K 5.0 04/09/2017   CL 102 04/09/2017   CREATININE 0.85 04/09/2017   BUN 15 04/09/2017   CO2 33 (H) 04/09/2017   TSH 2.53 04/09/2017    [place] Extrem Up Left Ltd  Result Date: 04/17/2016 CLINICAL DATA:  76 year old female with bilateral axillary pain. The patient denies any lumps. The patient reports bilateral axillary surgery in approximately 2010 by Dr. Tamala Julian for benign lumps. EXAM: 2D DIGITAL DIAGNOSTIC BILATERAL MAMMOGRAM WITH CAD AND ADJUNCT TOMO ULTRASOUND BILATERAL AXILLA COMPARISON:  Previous exam(s). ACR Breast Density Category b:  There are scattered areas of fibroglandular density. FINDINGS: No suspicious mass, calcifications, or other abnormality is identified within either breast. Mammographic images were processed with CAD. On physical exam, there is scarring in the bilateral axilla without discrete mass. Targeted ultrasound of the bilateral axilla was performed demonstrating no suspicious cystic or solid sonographic finding in the areas of concern. IMPRESSION: No mammographic or sonographic evidence of malignancy. RECOMMENDATION: 1. Screening mammogram in one year.(Code:SM-B-01Y) 2. The patient was instructed to follow-up with her referring clinician regarding her axillary pain. I have discussed the findings and recommendations with the patient. Results were also provided in writing at the conclusion of the visit. If applicable, a reminder letter will be sent to the patient regarding the next appointment. BI-RADS CATEGORY  1: Negative. Electronically Signed   By: Pamelia Hoit M.D.   On: 04/17/2016 12:34   [place] Extrem Up Right Ltd  Result Date: 04/17/2016 CLINICAL DATA:  76 year old female with bilateral axillary pain. The patient denies any lumps. The patient reports bilateral axillary surgery in approximately 2010 by Dr. Tamala Julian for benign lumps. EXAM: 2D DIGITAL DIAGNOSTIC BILATERAL MAMMOGRAM WITH CAD AND ADJUNCT TOMO ULTRASOUND BILATERAL AXILLA COMPARISON:  Previous exam(s). ACR Breast Density Category b: There are scattered areas of fibroglandular density. FINDINGS: No suspicious mass, calcifications, or other abnormality is identified within either breast. Mammographic images were processed with CAD. On physical exam, there is scarring in the bilateral axilla without discrete mass. Targeted ultrasound of the bilateral axilla was performed demonstrating no suspicious cystic or solid sonographic finding in the areas of concern. IMPRESSION: No mammographic or sonographic evidence of malignancy. RECOMMENDATION: 1. Screening mammogram in  one year.(Code:SM-B-01Y) 2. The patient was instructed to follow-up with her referring clinician regarding her axillary pain. I have discussed the findings and recommendations with the patient. Results were also provided in writing at the conclusion of the visit. If applicable, a reminder letter will be sent to the patient regarding the next appointment. BI-RADS CATEGORY  1: Negative. Electronically Signed   By: Pamelia Hoit M.D.   On: 04/17/2016 12:34   Mm Diag Breast Tomo Bilateral  Result Date: 04/17/2016 CLINICAL DATA:  76 year old female with bilateral axillary pain. The patient denies any lumps. The patient reports bilateral axillary surgery in approximately 2010 by Dr.  Smith for benign lumps. EXAM: 2D DIGITAL DIAGNOSTIC BILATERAL MAMMOGRAM WITH CAD AND ADJUNCT TOMO ULTRASOUND BILATERAL AXILLA COMPARISON:  Previous exam(s). ACR Breast Density Category b: There are scattered areas of fibroglandular density. FINDINGS: No suspicious mass, calcifications, or other abnormality is identified within either breast. Mammographic images were processed with CAD. On physical exam, there is scarring in the bilateral axilla without discrete mass. Targeted ultrasound of the bilateral axilla was performed demonstrating no suspicious cystic or solid sonographic finding in the areas of concern. IMPRESSION: No mammographic or sonographic evidence of malignancy. RECOMMENDATION: 1. Screening mammogram in one year.(Code:SM-B-01Y) 2. The patient was instructed to follow-up with her referring clinician regarding her axillary pain. I have discussed the findings and recommendations with the patient. Results were also provided in writing at the conclusion of the visit. If applicable, a reminder letter will be sent to the patient regarding the next appointment. BI-RADS CATEGORY  1: Negative. Electronically Signed   By: Pamelia Hoit M.D.   On: 04/17/2016 12:34       Assessment & Plan:   Problem List Items Addressed This Visit     Dysphagia    Increased acid reflux with dysphagia.  Will change omeprazole to protonix. Refer back to GI. Discussed eating slowly, chewing food well and taking small bites.        Relevant Orders   Ambulatory referral to Gastroenterology   Essential hypertension, benign - Primary    Blood pressure under good control.  Continue same medication regimen.  Follow pressures.  Follow metabolic panel.        Relevant Orders   CBC with Differential/Platelet (Completed)   TSH (Completed)   Basic metabolic panel (Completed)   Gastritis    Previously noted on EGD.  On omeprazole.  Symptoms as outlined.  Change to protonix.  Refer back to GI.       GERD (gastroesophageal reflux disease)    Worsening acid reflux.  Change to protonix.  Refer back to GI.       Relevant Medications   pantoprazole (PROTONIX) 40 MG tablet   Other Relevant Orders   Ambulatory referral to Gastroenterology   Health care maintenance    Physical today 04/09/17.  Mammogram 04/17/16 - Birads I.  Scheduled for f/u mammogram.  colonoscopy 09/28/14 - internal hemorrhoids.  Recommended f/u in five years.  Pt requested pap - pap 04/09/17.        Hypercholesterolemia    On crestor.  Low cholesterol diet and exercise.  Follow lipid panel and liver function tests.        Relevant Orders   Hepatic function panel (Completed)   Lipid panel (Completed)   MAI (mycobacterium avium-intracellulare) infection ([place])    On advair.  Followed by pulmonary.       Meningioma ([place])    Followed by neurology.        Vaginal pain   Relevant Orders   Urinalysis, Routine w reflex microscopic (Completed)   Urine Culture (Completed)   Cytology - PAP (Completed)       Einar Pheasant, MD

## 2017-04-10 LAB — URINE CULTURE
MICRO NUMBER: 81323583
SPECIMEN QUALITY:: ADEQUATE

## 2017-04-10 LAB — CYTOLOGY - PAP
Diagnosis: NEGATIVE
HPV (WINDOPATH): NOT DETECTED

## 2017-04-11 ENCOUNTER — Encounter: Payer: Self-pay | Admitting: Internal Medicine

## 2017-04-11 NOTE — Assessment & Plan Note (Signed)
Increased acid reflux with dysphagia.  Will change omeprazole to protonix. Refer back to GI. Discussed eating slowly, chewing food well and taking small bites.

## 2017-04-11 NOTE — Assessment & Plan Note (Signed)
On crestor.  Low cholesterol diet and exercise.  Follow lipid panel and liver function tests.   

## 2017-04-11 NOTE — Assessment & Plan Note (Addendum)
Followed by neurology.   

## 2017-04-11 NOTE — Assessment & Plan Note (Signed)
Previously noted on EGD.  On omeprazole.  Symptoms as outlined.  Change to protonix.  Refer back to GI.

## 2017-04-11 NOTE — Assessment & Plan Note (Signed)
Blood pressure under good control.  Continue same medication regimen.  Follow pressures.  Follow metabolic panel.   

## 2017-04-11 NOTE — Assessment & Plan Note (Signed)
On advair.  Followed by pulmonary.

## 2017-04-11 NOTE — Assessment & Plan Note (Signed)
Worsening acid reflux.  Change to protonix.  Refer back to GI.

## 2017-04-30 ENCOUNTER — Ambulatory Visit
Admission: RE | Admit: 2017-04-30 | Discharge: 2017-04-30 | Disposition: A | Discharge status: Home / Self Care | Payer: Medicare Other | Source: Ambulatory Visit | Attending: Internal Medicine | Admitting: Internal Medicine

## 2017-04-30 DIAGNOSIS — Z1231 Encounter for screening mammogram for malignant neoplasm of breast: Secondary | ICD-10-CM | POA: Insufficient documentation

## 2017-04-30 DIAGNOSIS — Z1239 Encounter for other screening for malignant neoplasm of breast: Secondary | ICD-10-CM

## 2017-05-15 HISTORY — PX: FRACTURE SURGERY: SHX138

## 2017-05-28 ENCOUNTER — Telehealth: Payer: Self-pay | Admitting: Internal Medicine

## 2017-05-28 MED ORDER — ROSUVASTATIN CALCIUM 10 MG PO TABS: 10.0000 mg | ORAL_TABLET | Freq: Every day | ORAL | 1 refills | Status: DC

## 2017-05-28 NOTE — Telephone Encounter (Signed)
Copied from [place] [id number]. Topic: Quick Communication - Rx Refill/Question >> May 28, 2017  9:51 AM Antonieta Iba C wrote: Self.   Refill for rosuvastatin (CRESTOR) 10 -      3 month supply    Preferred Pharmacy (with phone number or street name): [facility] [place], [place] to Registered [facility] Sites   Agent: Please be advised that RX refills may take up to 3 business days. We ask that you follow-up with your pharmacy.

## 2017-06-06 ENCOUNTER — Ambulatory Visit: Payer: Medicare Other | Admitting: Internal Medicine

## 2017-06-25 DIAGNOSIS — H9319 Tinnitus, unspecified ear: Secondary | ICD-10-CM | POA: Diagnosis not present

## 2017-06-25 DIAGNOSIS — H6122 Impacted cerumen, left ear: Secondary | ICD-10-CM | POA: Diagnosis not present

## 2017-07-24 DIAGNOSIS — M5442 Lumbago with sciatica, left side: Secondary | ICD-10-CM | POA: Diagnosis not present

## 2017-07-30 ENCOUNTER — Encounter: Payer: Self-pay | Admitting: Family

## 2017-07-30 ENCOUNTER — Ambulatory Visit (INDEPENDENT_AMBULATORY_CARE_PROVIDER_SITE_OTHER): Payer: Medicare Other | Admitting: Family

## 2017-07-30 VITALS — BP 140/76 | HR 70 | Temp 98.4°F | Resp 16 | Wt 189.5 lb

## 2017-07-30 DIAGNOSIS — R300 Dysuria: Secondary | ICD-10-CM | POA: Diagnosis not present

## 2017-07-30 DIAGNOSIS — R350 Frequency of micturition: Secondary | ICD-10-CM | POA: Diagnosis not present

## 2017-07-30 DIAGNOSIS — M25552 Pain in left hip: Secondary | ICD-10-CM | POA: Diagnosis not present

## 2017-07-30 LAB — URINALYSIS, ROUTINE W REFLEX MICROSCOPIC
BILIRUBIN URINE: NEGATIVE
HGB URINE DIPSTICK: NEGATIVE
KETONES UR: NEGATIVE
LEUKOCYTES UA: NEGATIVE
NITRITE: NEGATIVE
PH: 6 (ref 5.0–8.0)
Specific Gravity, Urine: <=1.005 — AB (ref 1.000–1.030)
Total Protein, Urine: NEGATIVE
Urine Glucose: NEGATIVE
Urobilinogen, UA: 0.2 (ref 0.0–1.0)

## 2017-07-30 LAB — POCT URINALYSIS DIPSTICK
BILIRUBIN UA: NEGATIVE
Blood, UA: NEGATIVE
GLUCOSE UA: NEGATIVE
KETONES UA: NEGATIVE
Leukocytes, UA: NEGATIVE
Nitrite, UA: NEGATIVE
PH UA: 6 (ref 5.0–8.0)
Protein, UA: NEGATIVE
Spec Grav, UA: 1.01 (ref 1.010–1.025)
Urobilinogen, UA: NEGATIVE E.U./dL — AB

## 2017-07-30 MED ORDER — MELOXICAM 7.5 MG PO TABS: 7.5000 mg | ORAL_TABLET | Freq: Every day | ORAL | 0 refills | Status: DC

## 2017-07-30 NOTE — Assessment & Plan Note (Signed)
Exam supports musculoskeletal etiology.  Reducible pain over SI joint.  I am pleased the patient has benefited from Kenalog injection from urgent care as well as muscle relaxant.  She has not started the prednisone.  We discussed today that she may benefit more from being on the medication, low-dose, Mobic as needed.  She works as a Product manager.  She will give this medication a trial for the next couple weeks and return to me for follow-up.  At that time if pain is not better, we will consider referral to orthopedics, imaging, physical therapy.

## 2017-07-30 NOTE — Assessment & Plan Note (Signed)
Chronic.  Patient appears to have chronic overactive bladder, stress incontinence.  Urine dip was negative for blood, leukocytes, nitrates.  Pending urine culture.  If urine micro, culture are negative for infection, patient and I discussed a trial of Vesicare.  Also advised her that when she comes back for follow-up,  we will perform a pelvic exam to ensure no prolapse in the context of prior surgery.  Patient was in agreement and will make one month follow-up

## 2017-07-30 NOTE — Progress Notes (Signed)
Subjective:    [name], female    DOB: 1941-03-29, 77 y.o.   MRN: [id number]  CC: Angela Cohen is a 77 y.o. female who presents today for an acute visit.    HPI: CC: left groin pain week and half ago, waxes and waned. 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 Noted left numbness tingling on left leg. No falls, weakness.   Notes last week has had suprapubic pressure. Did have abdominal 'soreness'. No abdominal pain, distention. BM today. No diarrhea, fever, blood in stools.   Radiates to low back and leg  Seen at urgent care, felt better after 'shot, I could straighten up.' Robaxin is helping, taking BID.   H/o urinary incontinence. Urgency. Urinary leakage with coughing, sneezing. No hematuria. Wears pads. No recurrent UTI. H/o bladder surgery after Dr Jacqlyn Larsen.   No h/o GIB.      seen UC 07/24/17. Given skelaxin, prednisone ( hasnt started)  Also given toradol injection  HISTORY:  Past Medical History:  Diagnosis Date  . Fibrocystic breast disease   . Gastritis    EGD - schatzki's ring, gastritis and gastric polyps  . History of colon polyps   . Hypercholesterolemia   . Hypertension   . MAI (mycobacterium avium-intracellulare) infection ([place])   . Osteoporosis   . Reflux esophagitis    Past Surgical History:  Procedure Laterality Date  . ABDOMINAL HYSTERECTOMY  1998  . BREAST EXCISIONAL BIOPSY Bilateral 1970's   benign  . BREAST EXCISIONAL BIOPSY Bilateral 2010   pt  stated dr Tamala Julian did bilat axilla lumps neg   . BREAST SURGERY     cyst(benign)  . COLONOSCOPY N/A 09/28/2014   Procedure: COLONOSCOPY;  Surgeon: Manya Silvas, MD;  Location: [facility] ENDOSCOPY;  Service: Endoscopy;  Laterality: N/A;  . ESOPHAGOGASTRODUODENOSCOPY N/A 09/28/2014   Procedure: ESOPHAGOGASTRODUODENOSCOPY (EGD);  Surgeon: Manya Silvas, MD;  Location: [facility] ENDOSCOPY;  Service: Endoscopy;  Laterality: N/A;  .  ESOPHAGOGASTRODUODENOSCOPY (EGD) WITH PROPOFOL N/A 04/26/2016   Procedure: ESOPHAGOGASTRODUODENOSCOPY (EGD) WITH PROPOFOL;  Surgeon: Manya Silvas, MD;  Location: [facility] ENDOSCOPY;  Service: Endoscopy;  Laterality: N/A;  . excision of right axillary lipoma  10/07  . FOOT SURGERY Right   . HEMORRHOID SURGERY    . HERNIA REPAIR  10/07  . NASAL SEPTUM SURGERY     Family History  Problem Relation Age of Onset  . Heart disease Mother 65       heart attack  . Cancer Father        lung  . Stroke Brother   . Diabetes Brother   . Diabetes Sister   . Breast cancer Sister 8    Allergies: Patient has no known allergies. Current Outpatient Medications on File Prior to Visit  Medication Sig Dispense Refill  . aspirin (ASPIRIN EC) 81 MG EC tablet Take 81 mg by mouth daily. Swallow whole.    Marland Kitchen Fluticasone-Salmeterol (ADVAIR) 250-50 MCG/DOSE AEPB Inhale 2 puffs into the lungs 2 (two) times daily.    Marland Kitchen LORazepam (ATIVAN) 0.5 MG tablet 1/2 tablet to be taken just prior to her scan. 1 tablet 0  . metaxalone (SKELAXIN) 800 MG tablet Take 800 mg by mouth 3 (three) times daily.  0  . metoprolol succinate (TOPROL-XL) 25 MG 24 hr tablet Take 1 tablet (25 mg total) by mouth 2 (two) times daily. 180 tablet 0  . Multiple Vitamin (MULTIVITAMIN WITH MINERALS) TABS Take 1  tablet by mouth daily.    . Omega-3 Fatty Acids (FISH OIL) 1000 MG CAPS Take 3,000 mg by mouth daily.    Marland Kitchen omeprazole (PRILOSEC) 20 MG capsule Take 1 capsule (20 mg total) by mouth 2 (two) times daily. 180 capsule 1  . pantoprazole (PROTONIX) 40 MG tablet Take 1 tablet (40 mg total) by mouth 2 (two) times daily before a meal. 180 tablet 1  . rosuvastatin (CRESTOR) 10 MG tablet Take 1 tablet (10 mg total) by mouth daily. 90 tablet 1  . albuterol (PROVENTIL HFA;VENTOLIN HFA) 108 (90 BASE) MCG/ACT inhaler Inhale 2 puffs into the lungs every 6 (six) hours as needed.    . predniSONE (STERAPRED UNI-PAK 21 TAB) 10 MG (21) TBPK tablet      No  current facility-administered medications on file prior to visit.     Social History   Tobacco Use  . Smoking status: Never Smoker  . Smokeless tobacco: Never Used  Substance Use Topics  . Alcohol use: No    Alcohol/week: 0.0 oz  . Drug use: No    Review of Systems  Constitutional: Negative for chills and fever.  Respiratory: Negative for cough.   Cardiovascular: Negative for chest pain and palpitations.  Gastrointestinal: Negative for nausea and vomiting.  Genitourinary: Positive for frequency and urgency. Negative for dysuria, flank pain and hematuria.      Objective:    BP 140/76 (BP Location: Left Arm, Patient Position: Sitting, Cuff Size: Normal)   Pulse 70   Temp 98.4 F (36.9 C) (Oral)   Resp 16   Wt 189 lb 8 oz (86 kg)   SpO2 96%   BMI 32.53 kg/m    Physical Exam  Constitutional: She appears well-developed and well-nourished.  Eyes: Conjunctivae are normal.  Cardiovascular: Normal rate, regular rhythm, normal heart sounds and normal pulses.  Pulmonary/Chest: Effort normal and breath sounds normal. She has no wheezes. She has no rhonchi. She has no rales.  Abdominal: Soft. Normal appearance and bowel sounds are normal. She exhibits no distension, no fluid wave, no ascites and no mass. There is no tenderness. There is no rigidity, no rebound, no guarding and no CVA tenderness.  Musculoskeletal:       Left hip: She exhibits normal range of motion, normal strength, no tenderness and no bony tenderness.       Lumbar back: She exhibits pain. She exhibits normal range of motion, no tenderness, no bony tenderness, no swelling, no edema and no spasm.       Back:   Pain with palpation over left SI joint.   Left Hip: No limp or waddling gait. Full ROM with flexion and hip rotation in flexion.    No pain of lateral hip with  (flexion-abduction-external rotation) test.   No pain with deep palpation of greater trochanter.    Neurological: She is alert. She has normal  strength. No sensory deficit.  Reflex Scores:      Patellar reflexes are 2+ on the right side and 2+ on the left side. Sensation and strength intact bilateral lower extremities.  Skin: Skin is warm and dry.  Psychiatric: She has a normal mood and affect. Her speech is normal and behavior is normal. Thought content normal.  Vitals reviewed.      Assessment & Plan:   Problem List Items Addressed This Visit      Other   Urinary frequency    Chronic.  Patient appears to have chronic overactive bladder, stress incontinence.  Urine  dip was negative for blood, leukocytes, nitrates.  Pending urine culture.  If urine micro, culture are negative for infection, patient and I discussed a trial of Vesicare.  Also advised her that when she comes back for follow-up,  we will perform a pelvic exam to ensure no prolapse in the context of prior surgery.  Patient was in agreement and will make one month follow-up      Left hip pain - Primary    Exam supports musculoskeletal etiology.  Reducible pain over SI joint.  I am pleased the patient has benefited from Kenalog injection from urgent care as well as muscle relaxant.  She has not started the prednisone.  We discussed today that she may benefit more from being on the medication, low-dose, Mobic as needed.  She works as a Product manager.  She will give this medication a trial for the next couple weeks and return to me for follow-up.  At that time if pain is not better, we will consider referral to orthopedics, imaging, physical therapy.      Relevant Medications   meloxicam (MOBIC) 7.5 MG tablet    Other Visit Diagnoses    Dysuria       Relevant Orders   POCT Urinalysis Dipstick (Completed)   CULTURE, URINE COMPREHENSIVE   Urinalysis, Routine w reflex microscopic        I am having Raveen H. Imber start on meloxicam. I am also having her maintain her aspirin, multivitamin with minerals, albuterol, Fish Oil, Fluticasone-Salmeterol, LORazepam, omeprazole,  metoprolol succinate, pantoprazole, rosuvastatin, predniSONE, and metaxalone.   Meds ordered this encounter  Medications  . meloxicam (MOBIC) 7.5 MG tablet    Sig: Take 1 tablet (7.5 mg total) by mouth daily.    Dispense:  30 tablet    Refill:  0    Order Specific Question:   Supervising Provider    Answer:   Crecencio Mc [[id number]]    Return precautions given.   Risks, benefits, and alternatives of the medications and treatment plan prescribed today were discussed, and patient expressed understanding.   Education regarding symptom management and diagnosis given to patient on AVS.  Continue to follow with Einar Pheasant, MD for routine health maintenance.   Loyce Dys Kluever and I agreed with plan.   Mable Paris, FNP

## 2017-07-30 NOTE — Patient Instructions (Addendum)
Trial mobic for hip pain   Continue to take skelexin as needed for pain  Come back in ONE month ,sooner if pain persists. We will need to do imaging and further evaluation if no improvement.    Once we get urinary culture back we can consider vesciare for overactive bladder however we also need to do pelvic exam at follow up.

## 2017-07-31 LAB — CULTURE, URINE COMPREHENSIVE
MICRO NUMBER: 90338741
SPECIMEN QUALITY: ADEQUATE

## 2017-08-01 ENCOUNTER — Ambulatory Visit: Payer: Self-pay

## 2017-08-01 DIAGNOSIS — R350 Frequency of micturition: Secondary | ICD-10-CM

## 2017-08-01 NOTE — Telephone Encounter (Signed)
Notes recorded by Burnard Hawthorne, FNP on 08/01/2017 at 10:07 AM EDT Call pt- let me know if not able to reach.   Your urine is not indicative of a urinary tract infection which is diagnosed when more than 100,000 bacteria are seen. Your urine culture appears to have normal vaginal bacteria. I suspect the urinary frequency is related to OAB. Would she like to trial vesicare? If so, she would need a follow up with me for pelvic exam.please schedule.  How is left hip pain?? Any better on mobic? / / Pt states she can only come for pelvic exam on Mondays and Wednesdays. Unable to find any openings on those days. Pt has appt on 09/03/17. Can pelvic exam performed then?  Pt would like to try vesicare. Pt states her hip is better. Answer Assessment - Initial Assessment Questions 1. REASON FOR CALL or QUESTION: "What is your reason for calling today?" or "How can I best help you?" or "What question do you have that I can help answer?"     Lab results  Protocols used: INFORMATION ONLY CALL-A-AH

## 2017-08-02 NOTE — Telephone Encounter (Signed)
Please advise 

## 2017-08-02 NOTE — Telephone Encounter (Signed)
Patient was seen by Joycelyn Schmid on 07/30/2017.

## 2017-08-03 MED ORDER — OXYBUTYNIN CHLORIDE 5 MG PO TABS: 2.5000 mg | ORAL_TABLET | Freq: Two times a day (BID) | ORAL | 0 refills | Status: DC

## 2017-08-03 NOTE — Addendum Note (Signed)
Addended by: Burnard Hawthorne on: 08/03/2017 03:31 PM   Modules accepted: Orders

## 2017-08-03 NOTE — Telephone Encounter (Signed)
Please advise patient, that she may follow-up with her PCP, Dr. Nicki Reaper regarding her urinary complaints.  She may or may not decide to do a pelvic exam when she comes in for follow-up.  Decision can be made with Dr. Nicki Reaper regarding starting medication for overactive bladder which I discussed with patient.  Pleased to see the urine culture did not show any infection.

## 2017-08-03 NOTE — Telephone Encounter (Signed)
Sent low dose oxybutynin to trial prior to seeing pcp 4/22  FYI scott, trial of antispasmodic for suspected OAB. I usually perform pelvic so she may mention that. I asked her to come back for this and she asked if she could just keep f/u with you 4/22.   Just wanted you to be aware.   Sweet patient!

## 2017-08-03 NOTE — Telephone Encounter (Signed)
Spoke with patient she will follow up with Dr Nicki Reaper regarding urinary complaints.  She would like to go ahead and start medication for overactive bladder prefers generic.

## 2017-08-16 ENCOUNTER — Telehealth: Payer: Self-pay | Admitting: Internal Medicine

## 2017-08-16 MED ORDER — METOPROLOL SUCCINATE ER 25 MG PO TB24: 25.0000 mg | ORAL_TABLET | Freq: Two times a day (BID) | ORAL | 0 refills | Status: DC

## 2017-08-16 NOTE — Telephone Encounter (Signed)
Copied from [place] [id number]. Topic: Quick Communication - Rx Refill/Question >> Aug 16, 2017  8:33 AM Scherrie Gerlach wrote: Medication: metoprolol succinate (TOPROL-XL) 25 MG 24 hr tablet Has the patient contacted their pharmacy? Yes  [facility] to call the office [facility] supply [facility] [place] [place] [facility] [address] [phone] (Phone) [phone] (Fax)

## 2017-08-26 ENCOUNTER — Other Ambulatory Visit: Payer: Self-pay | Admitting: Family

## 2017-08-26 DIAGNOSIS — M25552 Pain in left hip: Secondary | ICD-10-CM

## 2017-09-03 ENCOUNTER — Ambulatory Visit: Payer: Medicare Other | Admitting: Internal Medicine

## 2017-09-10 ENCOUNTER — Telehealth: Payer: Self-pay | Admitting: Internal Medicine

## 2017-09-10 NOTE — Telephone Encounter (Signed)
Pt declined AWV. Please do not call again and ask. Thanks!

## 2017-09-27 ENCOUNTER — Telehealth: Payer: Self-pay | Admitting: Internal Medicine

## 2017-09-27 NOTE — Telephone Encounter (Signed)
Please advise 

## 2017-09-27 NOTE — Telephone Encounter (Signed)
Copied from [place] [id number]. Topic: Referral - Request >> Sep 27, 2017  8:33 AM Robina Ade, Helene Kelp D wrote: Reason for CRM: Patient called and would like for Dr. Nicki Reaper to send over to radiology for a swallowing test. She said that she has a hard trouble swallowing. If there are any questions please call patient back.

## 2017-09-28 NOTE — Telephone Encounter (Signed)
Per Dr. Nicki Reaper, patient needed OV to discuss this. Patient confirmed no acute symptoms she has just noticed that it is becoming more difficult for her to swallow foods and she has had her esophagus stretched before which was not effective. Scheduled for 5/29 at 12:30.

## 2017-10-01 ENCOUNTER — Other Ambulatory Visit: Payer: Self-pay | Admitting: Family

## 2017-10-01 DIAGNOSIS — R350 Frequency of micturition: Secondary | ICD-10-CM

## 2017-10-10 ENCOUNTER — Ambulatory Visit (INDEPENDENT_AMBULATORY_CARE_PROVIDER_SITE_OTHER): Payer: Medicare Other | Admitting: Internal Medicine

## 2017-10-10 ENCOUNTER — Ambulatory Visit (INDEPENDENT_AMBULATORY_CARE_PROVIDER_SITE_OTHER): Payer: Medicare Other

## 2017-10-10 VITALS — BP 124/68 | HR 71 | Temp 97.6°F | Resp 18 | Wt 190.0 lb

## 2017-10-10 DIAGNOSIS — K219 Gastro-esophageal reflux disease without esophagitis: Secondary | ICD-10-CM

## 2017-10-10 DIAGNOSIS — R05 Cough: Secondary | ICD-10-CM | POA: Diagnosis not present

## 2017-10-10 DIAGNOSIS — I10 Essential (primary) hypertension: Secondary | ICD-10-CM | POA: Diagnosis not present

## 2017-10-10 DIAGNOSIS — R1310 Dysphagia, unspecified: Secondary | ICD-10-CM | POA: Diagnosis not present

## 2017-10-10 DIAGNOSIS — E7800 Pure hypercholesterolemia, unspecified: Secondary | ICD-10-CM

## 2017-10-10 DIAGNOSIS — R059 Cough, unspecified: Secondary | ICD-10-CM

## 2017-10-10 NOTE — Progress Notes (Signed)
[name], female   DOB: [birth year], 77 y.o.   MRN: [id number]   Subjective:    [name], female    DOB: 1940-11-27, 77 y.o.   MRN: [id number]  HPI  Patient here as a work in to discuss some concerns regarding swallowing issues.  Reports that when she eats, she feels as if food is getting stuck.  Some swallowing issues/difficulty.  Some acid reflux.  Taking protonix.  No chest pain.  No abdominal pain.  Bowels moving.  No sob.  Takes aloe - before eats.  Drinking boost.  Also taking a probiotic and metamucil.  Keeping bowels moving.  Previous EGD 2017 - Schatzki ring and hiatal hernia.  S/p dilatation.  Reports some cough.     Past Medical History:  Diagnosis Date  . Fibrocystic breast disease   . Gastritis    EGD - schatzki's ring, gastritis and gastric polyps  . History of colon polyps   . Hypercholesterolemia   . Hypertension   . MAI (mycobacterium avium-intracellulare) infection ([place])   . Osteoporosis   . Reflux esophagitis    Past Surgical History:  Procedure Laterality Date  . ABDOMINAL HYSTERECTOMY  1998  . BREAST EXCISIONAL BIOPSY Bilateral 1970's   benign  . BREAST EXCISIONAL BIOPSY Bilateral 2010   pt  stated dr Tamala Julian did bilat axilla lumps neg   . BREAST SURGERY     cyst(benign)  . COLONOSCOPY N/A 09/28/2014   Procedure: COLONOSCOPY;  Surgeon: Manya Silvas, MD;  Location: [facility] ENDOSCOPY;  Service: Endoscopy;  Laterality: N/A;  . ESOPHAGOGASTRODUODENOSCOPY N/A 09/28/2014   Procedure: ESOPHAGOGASTRODUODENOSCOPY (EGD);  Surgeon: Manya Silvas, MD;  Location: [facility] ENDOSCOPY;  Service: Endoscopy;  Laterality: N/A;  . ESOPHAGOGASTRODUODENOSCOPY (EGD) WITH PROPOFOL N/A 04/26/2016   Procedure: ESOPHAGOGASTRODUODENOSCOPY (EGD) WITH PROPOFOL;  Surgeon: Manya Silvas, MD;  Location: [facility] ENDOSCOPY;  Service: Endoscopy;  Laterality: N/A;  . excision of right axillary lipoma  10/07  . FOOT SURGERY Right   . HEMORRHOID SURGERY    . HERNIA  REPAIR  10/07  . NASAL SEPTUM SURGERY     Family History  Problem Relation Age of Onset  . Heart disease Mother 8       heart attack  . Cancer Father        lung  . Stroke Brother   . Diabetes Brother   . Diabetes Sister   . Breast cancer Sister 25   Social History   Socioeconomic History  . Marital status: Widowed    Spouse name: Not on file  . Number of children: Not on file  . Years of education: Not on file  . Highest education level: Not on file  Occupational History  . Not on file  Social Needs  . Financial resource strain: Not on file  . Food insecurity:    Worry: Not on file    Inability: Not on file  . Transportation needs:    Medical: Not on file    Non-medical: Not on file  Tobacco Use  . Smoking status: Never Smoker  . Smokeless tobacco: Never Used  Substance and Sexual Activity  . Alcohol use: No    Alcohol/week: 0.0 oz  . Drug use: No  . Sexual activity: Not on file  Lifestyle  . Physical activity:    Days per week: Not on file    Minutes per session: Not on file  . Stress: Not on file  Relationships  . Social connections:  Talks on phone: Not on file    Gets together: Not on file    Attends religious service: Not on file    Active member of club or organization: Not on file    Attends meetings of clubs or organizations: Not on file    Relationship status: Not on file  Other Topics Concern  . Not on file  Social History Narrative  . Not on file    Outpatient Encounter Medications as of 10/10/2017  Medication Sig  . albuterol (PROVENTIL HFA;VENTOLIN HFA) 108 (90 BASE) MCG/ACT inhaler Inhale 2 puffs into the lungs every 6 (six) hours as needed.  Marland Kitchen aspirin (ASPIRIN EC) 81 MG EC tablet Take 81 mg by mouth daily. Swallow whole.  Marland Kitchen Fluticasone-Salmeterol (ADVAIR) 250-50 MCG/DOSE AEPB Inhale 2 puffs into the lungs 2 (two) times daily.  Marland Kitchen LORazepam (ATIVAN) 0.5 MG tablet 1/2 tablet to be taken just prior to her scan.  . meloxicam (MOBIC) 7.5 MG  tablet TAKE 1 TABLET BY MOUTH EVERY DAY  . metaxalone (SKELAXIN) 800 MG tablet Take 800 mg by mouth 3 (three) times daily.  . metoprolol succinate (TOPROL-XL) 25 MG 24 hr tablet Take 1 tablet (25 mg total) by mouth 2 (two) times daily.  . Multiple Vitamin (MULTIVITAMIN WITH MINERALS) TABS Take 1 tablet by mouth daily.  . Omega-3 Fatty Acids (FISH OIL) 1000 MG CAPS Take 3,000 mg by mouth daily.  Marland Kitchen omeprazole (PRILOSEC) 20 MG capsule Take 1 capsule (20 mg total) by mouth 2 (two) times daily.  Marland Kitchen oxybutynin (DITROPAN) 5 MG tablet TAKE 0.5 TABLETS (2.5 MG TOTAL) BY MOUTH 2 (TWO) TIMES DAILY.  . pantoprazole (PROTONIX) 40 MG tablet Take 1 tablet (40 mg total) by mouth 2 (two) times daily before a meal.  . rosuvastatin (CRESTOR) 10 MG tablet Take 1 tablet (10 mg total) by mouth daily.  . [DISCONTINUED] predniSONE (STERAPRED UNI-PAK 21 TAB) 10 MG (21) TBPK tablet    No facility-administered encounter medications on file as of 10/10/2017.     Review of Systems  Constitutional: Negative for fever and unexpected weight change.  HENT: Negative for congestion and sinus pressure.   Respiratory: Negative for cough, chest tightness and shortness of breath.   Cardiovascular: Negative for chest pain and leg swelling.  Gastrointestinal: Negative for abdominal pain, diarrhea, nausea and vomiting.  Genitourinary: Negative for difficulty urinating and dysuria.  Musculoskeletal: Negative for joint swelling and myalgias.  Skin: Negative for color change and rash.  Neurological: Negative for dizziness, light-headedness and headaches.  Psychiatric/Behavioral: Negative for agitation and dysphoric mood.       Objective:     Blood pressure rechecked by me:  128/80  Physical Exam  Constitutional: She appears well-developed and well-nourished. No distress.  HENT:  Nose: Nose normal.  Mouth/Throat: Oropharynx is clear and moist.  Neck: Neck supple. No thyromegaly present.  Cardiovascular: Normal rate and  regular rhythm.  Pulmonary/Chest: Breath sounds normal. No respiratory distress. She has no wheezes.  Abdominal: Soft. Bowel sounds are normal. There is no tenderness.  Musculoskeletal: She exhibits no edema or tenderness.  Lymphadenopathy:    She has no cervical adenopathy.  Skin: No rash noted. No erythema.  Psychiatric: She has a normal mood and affect. Her behavior is normal.    BP 124/68 (BP Location: Left Arm, Patient Position: Sitting, Cuff Size: Normal)   Pulse 71   Temp 97.6 F (36.4 C) (Oral)   Resp 18   Wt 190 lb (86.2 kg)   SpO2 98%  BMI 32.61 kg/m  Wt Readings from Last 3 Encounters:  10/10/17 190 lb (86.2 kg)  07/30/17 189 lb 8 oz (86 kg)  04/09/17 181 lb (82.1 kg)     Lab Results  Component Value Date   WBC 5.6 04/09/2017   HGB 15.0 04/09/2017   HCT 43.9 04/09/2017   PLT 239.0 04/09/2017   GLUCOSE 113 (H) 04/09/2017   CHOL 196 04/09/2017   TRIG 117.0 04/09/2017   HDL 83.70 04/09/2017   LDLCALC 88 04/09/2017   ALT 25 04/09/2017   AST 18 04/09/2017   NA 140 04/09/2017   K 5.0 04/09/2017   CL 102 04/09/2017   CREATININE 0.85 04/09/2017   BUN 15 04/09/2017   CO2 33 (H) 04/09/2017   TSH 2.53 04/09/2017    Mm Digital Screening Bilateral  Result Date: 04/30/2017 CLINICAL DATA:  Screening. EXAM: DIGITAL SCREENING BILATERAL MAMMOGRAM WITH CAD COMPARISON:  Previous exam(s). ACR Breast Density Category b: There are scattered areas of fibroglandular density. FINDINGS: There are no findings suspicious for malignancy. Images were processed with CAD. IMPRESSION: No mammographic evidence of malignancy. A result letter of this screening mammogram will be mailed directly to the patient. RECOMMENDATION: Screening mammogram in one year. (Code:SM-B-01Y) BI-RADS CATEGORY  1: Negative. Electronically Signed   By: Fidela Salisbury M.D.   On: 04/30/2017 12:25       Assessment & Plan:   Problem List Items Addressed This Visit    Cough    Reports some cough.  Treat  acid reflux.  Schedule swallowing study and GI evaluation.        Relevant Orders   DG Chest 2 View (Completed)   Dysphagia - Primary    Reports dysphagia as outlined.  Treat acid reflux.  Schedule barium swallow with UGI.  Refer to GI.       Relevant Orders   DG Esophagus   Ambulatory referral to Gastroenterology   Essential hypertension, benign    Blood pressure under good control.  Continue same medication regimen.  Follow pressures.  Follow metabolic panel.        GERD (gastroesophageal reflux disease)    Continue PPI.  Follow.  Plan for GI evaluation.        Relevant Orders   Ambulatory referral to Gastroenterology   Hypercholesterolemia    On crestor.  Low cholesterol diet and exercise.  Follow lipid panel and liver function tests.            Einar Pheasant, MD

## 2017-10-13 ENCOUNTER — Encounter: Payer: Self-pay | Admitting: Internal Medicine

## 2017-10-13 NOTE — Assessment & Plan Note (Signed)
Blood pressure under good control.  Continue same medication regimen.  Follow pressures.  Follow metabolic panel.   

## 2017-10-13 NOTE — Assessment & Plan Note (Signed)
Continue PPI.  Follow.  Plan for GI evaluation.

## 2017-10-13 NOTE — Assessment & Plan Note (Signed)
On crestor.  Low cholesterol diet and exercise.  Follow lipid panel and liver function tests.   

## 2017-10-13 NOTE — Assessment & Plan Note (Signed)
Reports some cough.  Treat acid reflux.  Schedule swallowing study and GI evaluation.

## 2017-10-13 NOTE — Assessment & Plan Note (Signed)
Reports dysphagia as outlined.  Treat acid reflux.  Schedule barium swallow with UGI.  Refer to GI.

## 2017-10-19 ENCOUNTER — Telehealth: Payer: Self-pay

## 2017-10-19 NOTE — Telephone Encounter (Signed)
Copied from [place] [id number]. Topic: Quick Communication - Office Called Patient >> Oct 19, 2017  7:22 AM Yvette Rack wrote: Reason for CRM: pt returning Ernst Bowler call able a referral for a swallow test she states that she can only do Mondays and Wednesday

## 2017-10-29 ENCOUNTER — Ambulatory Visit
Admission: RE | Admit: 2017-10-29 | Discharge: 2017-10-29 | Disposition: A | Discharge status: Home / Self Care | Payer: Medicare Other | Source: Ambulatory Visit | Attending: Internal Medicine | Admitting: Internal Medicine

## 2017-10-29 DIAGNOSIS — K449 Diaphragmatic hernia without obstruction or gangrene: Secondary | ICD-10-CM | POA: Insufficient documentation

## 2017-10-29 DIAGNOSIS — R1310 Dysphagia, unspecified: Secondary | ICD-10-CM | POA: Diagnosis not present

## 2017-10-29 DIAGNOSIS — K224 Dyskinesia of esophagus: Secondary | ICD-10-CM | POA: Insufficient documentation

## 2017-10-30 ENCOUNTER — Telehealth: Payer: Self-pay | Admitting: *Deleted

## 2017-10-30 NOTE — Telephone Encounter (Signed)
Result note read to patient; verbalizes understanding.  Telephone encounter as result note was not routed to [facility]. 

## 2017-12-05 ENCOUNTER — Telehealth: Payer: Self-pay | Admitting: Internal Medicine

## 2017-12-05 DIAGNOSIS — K449 Diaphragmatic hernia without obstruction or gangrene: Secondary | ICD-10-CM | POA: Diagnosis not present

## 2017-12-05 DIAGNOSIS — K219 Gastro-esophageal reflux disease without esophagitis: Secondary | ICD-10-CM | POA: Diagnosis not present

## 2017-12-05 DIAGNOSIS — K228 Other specified diseases of esophagus: Secondary | ICD-10-CM | POA: Diagnosis not present

## 2017-12-05 NOTE — Telephone Encounter (Unsigned)
Copied from [place] [id number]. Topic: Quick Communication - Rx Refill/Question >> Dec 05, 2017  3:15 PM Percell Belt A wrote: Medication:  oxybutynin (DITROPAN) 5 MG tablet [[id number]]  rosuvastatin (CRESTOR) 10 MG tablet [[id number]]  Has the patient contacted their pharmacy? No  (Agent: If no, request that the patient contact the pharmacy for the refill.) (Agent: If yes, when and what did the pharmacy advise?)  Preferred Pharmacy (with phone number or street name):  [facility] 90 day supply per ins   Agent: Please be advised that RX refills may take up to 3 business days. We ask that you follow-up with your pharmacy.

## 2017-12-06 ENCOUNTER — Other Ambulatory Visit: Payer: Self-pay

## 2017-12-06 MED ORDER — ROSUVASTATIN CALCIUM 10 MG PO TABS: 10.0000 mg | ORAL_TABLET | Freq: Every day | ORAL | 1 refills | Status: DC

## 2017-12-06 NOTE — Telephone Encounter (Signed)
Need to confirm with pt if taking ditropan and how often taking.  Confirm needs refilled.  If taking regularly and tolerating - ok to refill x 2

## 2017-12-06 NOTE — Telephone Encounter (Signed)
Ok to fill the oxybutynin for her?

## 2017-12-06 NOTE — Telephone Encounter (Signed)
Angela Cohen last filled the oxybutin on 10-02-17 will Dr. Nicki Reaper refill ?

## 2017-12-07 NOTE — Telephone Encounter (Signed)
LMTCB ok for [facility] to speak with patient

## 2017-12-10 ENCOUNTER — Ambulatory Visit: Payer: Medicare Other | Admitting: Internal Medicine

## 2017-12-10 ENCOUNTER — Telehealth: Payer: Self-pay

## 2017-12-10 ENCOUNTER — Other Ambulatory Visit: Payer: Self-pay | Admitting: Internal Medicine

## 2017-12-10 ENCOUNTER — Other Ambulatory Visit: Payer: Self-pay

## 2017-12-10 DIAGNOSIS — R350 Frequency of micturition: Secondary | ICD-10-CM

## 2017-12-10 DIAGNOSIS — M25552 Pain in left hip: Secondary | ICD-10-CM

## 2017-12-10 MED ORDER — OXYBUTYNIN CHLORIDE 5 MG PO TABS: 2.5000 mg | ORAL_TABLET | Freq: Two times a day (BID) | ORAL | 0 refills | Status: DC

## 2017-12-10 NOTE — Telephone Encounter (Signed)
Copied from [place] [phone]. Topic: Quick Communication - Rx Refill/Question >> Dec 05, 2017  3:15 PM Percell Belt A wrote: Medication:  oxybutynin (DITROPAN) 5 MG tablet [[id number]]  rosuvastatin (CRESTOR) 10 MG tablet [[id number]]  Has the patient contacted their pharmacy? No  (Agent: If no, request that the patient contact the pharmacy for the refill.) (Agent: If yes, when and what did the pharmacy advise?)  Preferred Pharmacy (with phone number or street name):  [facility] 90 day supply per ins   Agent: Please be advised that RX refills may take up to 3 business days. We ask that you follow-up with your pharmacy. >> Dec 07, 2017  4:45 PM Percell Belt A wrote: Pt called back and confirmed that she is taking the Ditropan and regularly.  Pt stated it is going well and it is helping while she is at work while on her feet.  She would like it refilled and a 90 supply to [facility]   Patient last seen on 10-10-17  oxybutyinin last filled on 10-02-17

## 2017-12-10 NOTE — Telephone Encounter (Signed)
rx sent in 

## 2017-12-27 ENCOUNTER — Other Ambulatory Visit: Payer: Self-pay | Admitting: Family

## 2017-12-27 DIAGNOSIS — R350 Frequency of micturition: Secondary | ICD-10-CM

## 2017-12-28 NOTE — Telephone Encounter (Signed)
Last office visit 10/10/17 Cancelled appointment 12/10/17 Last refill 10/02/17

## 2017-12-29 NOTE — Telephone Encounter (Signed)
Per 12/10/17 note - rx sent in to [place].  Already addressed.

## 2018-01-03 ENCOUNTER — Other Ambulatory Visit: Payer: Self-pay | Admitting: Internal Medicine

## 2018-01-03 DIAGNOSIS — Z1231 Encounter for screening mammogram for malignant neoplasm of breast: Secondary | ICD-10-CM

## 2018-02-27 ENCOUNTER — Telehealth: Payer: Self-pay | Admitting: Internal Medicine

## 2018-02-27 NOTE — Telephone Encounter (Signed)
Copied from [place] [phone]. Topic: Quick Communication - Rx Refill/Question >> Feb 27, 2018 12:21 PM Cecelia Byars, NT wrote: Medication: metoprolol succinate (TOPROL-XL) 25 MG 24 hr tablet  Has the patient contacted their pharmacy? yes  (Agent: If no, request that the patient contact the pharmacy for the refill. (Agent: If yes, when and what did the pharmacy advise?   Preferred Pharmacy (with phone number or street name [facility] [place] [place] [facility] [phone] (Phone) [phone] (Fax)    Agent: Please be advised that RX refills may take up to 3 business days. We ask that you follow-up with your pharmacy.

## 2018-02-28 ENCOUNTER — Other Ambulatory Visit: Payer: Self-pay

## 2018-02-28 MED ORDER — METOPROLOL SUCCINATE ER 25 MG PO TB24: 25.0000 mg | ORAL_TABLET | Freq: Two times a day (BID) | ORAL | 1 refills | Status: DC

## 2018-02-28 NOTE — Telephone Encounter (Signed)
rx sent in 

## 2018-03-20 ENCOUNTER — Emergency Department
Admission: EM | Admit: 2018-03-20 | Discharge: 2018-03-20 | Disposition: A | Discharge status: Home / Self Care | Payer: Medicare Other | Attending: Emergency Medicine | Admitting: Emergency Medicine

## 2018-03-20 ENCOUNTER — Other Ambulatory Visit: Payer: Self-pay

## 2018-03-20 ENCOUNTER — Emergency Department: Payer: Medicare Other

## 2018-03-20 DIAGNOSIS — Z7982 Long term (current) use of aspirin: Secondary | ICD-10-CM | POA: Insufficient documentation

## 2018-03-20 DIAGNOSIS — S42202A Unspecified fracture of upper end of left humerus, initial encounter for closed fracture: Secondary | ICD-10-CM | POA: Diagnosis not present

## 2018-03-20 DIAGNOSIS — Y939 Activity, unspecified: Secondary | ICD-10-CM | POA: Insufficient documentation

## 2018-03-20 DIAGNOSIS — Y92008 Other place in unspecified non-institutional (private) residence as the place of occurrence of the external cause: Secondary | ICD-10-CM | POA: Diagnosis not present

## 2018-03-20 DIAGNOSIS — Y999 Unspecified external cause status: Secondary | ICD-10-CM | POA: Insufficient documentation

## 2018-03-20 DIAGNOSIS — W19XXXA Unspecified fall, initial encounter: Secondary | ICD-10-CM | POA: Diagnosis not present

## 2018-03-20 DIAGNOSIS — S42352A Displaced comminuted fracture of shaft of humerus, left arm, initial encounter for closed fracture: Secondary | ICD-10-CM | POA: Diagnosis not present

## 2018-03-20 DIAGNOSIS — S4992XA Unspecified injury of left shoulder and upper arm, initial encounter: Secondary | ICD-10-CM | POA: Diagnosis present

## 2018-03-20 DIAGNOSIS — S42292A Other displaced fracture of upper end of left humerus, initial encounter for closed fracture: Secondary | ICD-10-CM | POA: Diagnosis not present

## 2018-03-20 DIAGNOSIS — W1789XA Other fall from one level to another, initial encounter: Secondary | ICD-10-CM | POA: Diagnosis not present

## 2018-03-20 DIAGNOSIS — Z79899 Other long term (current) drug therapy: Secondary | ICD-10-CM | POA: Insufficient documentation

## 2018-03-20 DIAGNOSIS — R937 Abnormal findings on diagnostic imaging of other parts of musculoskeletal system: Secondary | ICD-10-CM | POA: Diagnosis not present

## 2018-03-20 DIAGNOSIS — S42492A Other displaced fracture of lower end of left humerus, initial encounter for closed fracture: Secondary | ICD-10-CM | POA: Diagnosis not present

## 2018-03-20 DIAGNOSIS — M25512 Pain in left shoulder: Secondary | ICD-10-CM | POA: Diagnosis not present

## 2018-03-20 DIAGNOSIS — S4290XA Fracture of unspecified shoulder girdle, part unspecified, initial encounter for closed fracture: Secondary | ICD-10-CM

## 2018-03-20 MED ORDER — ACETAMINOPHEN 500 MG PO TABS: 1000.0000 mg | ORAL_TABLET | Freq: Three times a day (TID) | ORAL | 0 refills | Status: AC

## 2018-03-20 MED ORDER — FENTANYL CITRATE (PF) 100 MCG/2ML IJ SOLN
50.0000 ug | Freq: Once | INTRAMUSCULAR | Status: AC | PRN
  Administered 2018-03-20: 50 ug via INTRAVENOUS
  Filled 2018-03-20: qty 2

## 2018-03-20 MED ORDER — ONDANSETRON HCL 4 MG/2ML IJ SOLN
4.0000 mg | Freq: Once | INTRAMUSCULAR | Status: AC
  Administered 2018-03-20: 4 mg via INTRAVENOUS
  Filled 2018-03-20: qty 2

## 2018-03-20 MED ORDER — OXYCODONE HCL 5 MG PO TABS: 5.0000 mg | ORAL_TABLET | Freq: Four times a day (QID) | ORAL | 0 refills | Status: DC | PRN

## 2018-03-20 MED ORDER — OXYCODONE-ACETAMINOPHEN 5-325 MG PO TABS
2.0000 | ORAL_TABLET | Freq: Once | ORAL | Status: AC
  Administered 2018-03-20: 2 via ORAL
  Filled 2018-03-20: qty 2

## 2018-03-20 NOTE — ED Provider Notes (Signed)
[facility] Emergency Department Provider Note  ____________________________________________  Time seen: Approximately 7:57 PM  I have reviewed the triage vital signs and the nursing notes.   HISTORY  Chief Complaint Fall    HPI Angela Cohen is a 77 y.o. female with a history of hypertension and osteoporosis she reports that she had a mechanical fall off of a 3 foot high porch today landing on her left arm resulting in sudden severe left shoulder pain that is nonradiating, worse with any movement, associated with the inability to raise the left arm.  No alleviating factors.  Described as aching.  No paresthesia or weakness in the hand.  No other associated injuries, no head injury neck pain or loss of consciousness.  Does not take blood thinners.      Past Medical History:  Diagnosis Date  . Fibrocystic breast disease   . Gastritis    EGD - schatzki's ring, gastritis and gastric polyps  . History of colon polyps   . Hypercholesterolemia   . Hypertension   . MAI (mycobacterium avium-intracellulare) infection ([place])   . Osteoporosis   . Reflux esophagitis      Patient Active Problem List   Diagnosis Date Noted  . Cough 10/10/2017  . Left hip pain 07/30/2017  . Vaginal pain 04/09/2017  . Chest tightness 04/02/2016  . Axillary pain 03/22/2016  . Meningioma ([place]) 02/21/2015  . Dizziness 10/18/2014  . Health care maintenance 10/18/2014  . Dysphagia 03/23/2014  . GERD (gastroesophageal reflux disease) 03/23/2014  . Headache 10/05/2013  . URI (upper respiratory infection) 10/05/2013  . Urinary frequency 09/30/2013  . Left ear pain 07/20/2013  . Neck pain 03/31/2013  . MAI (mycobacterium avium-intracellulare) infection ([place]) 08/31/2012  . Essential hypertension, benign 08/31/2012  . Hypercholesterolemia 08/31/2012  . History of colonic polyps 08/31/2012  . Gastritis 08/31/2012     Past Surgical History:  Procedure Laterality Date  .  ABDOMINAL HYSTERECTOMY  1998  . BREAST EXCISIONAL BIOPSY Bilateral 1970's   benign  . BREAST EXCISIONAL BIOPSY Bilateral 2010   pt  stated dr Tamala Julian did bilat axilla lumps neg   . BREAST SURGERY     cyst(benign)  . COLONOSCOPY N/A 09/28/2014   Procedure: COLONOSCOPY;  Surgeon: Manya Silvas, MD;  Location: [facility] ENDOSCOPY;  Service: Endoscopy;  Laterality: N/A;  . ESOPHAGOGASTRODUODENOSCOPY N/A 09/28/2014   Procedure: ESOPHAGOGASTRODUODENOSCOPY (EGD);  Surgeon: Manya Silvas, MD;  Location: [facility] ENDOSCOPY;  Service: Endoscopy;  Laterality: N/A;  . ESOPHAGOGASTRODUODENOSCOPY (EGD) WITH PROPOFOL N/A 04/26/2016   Procedure: ESOPHAGOGASTRODUODENOSCOPY (EGD) WITH PROPOFOL;  Surgeon: Manya Silvas, MD;  Location: [facility] ENDOSCOPY;  Service: Endoscopy;  Laterality: N/A;  . excision of right axillary lipoma  10/07  . FOOT SURGERY Right   . HEMORRHOID SURGERY    . HERNIA REPAIR  10/07  . NASAL SEPTUM SURGERY       Prior to Admission medications   Medication Sig Start Date End Date Taking? Authorizing Provider  acetaminophen (TYLENOL) 500 MG tablet Take 2 tablets (1,000 mg total) by mouth 3 (three) times daily for 15 days. 03/20/18 04/04/18  Carrie Mew, MD  albuterol (PROVENTIL HFA;VENTOLIN HFA) 108 (90 BASE) MCG/ACT inhaler Inhale 2 puffs into the lungs every 6 (six) hours as needed. 03/27/14 03/28/15  [provider]  aspirin (ASPIRIN EC) 81 MG EC tablet Take 81 mg by mouth daily. Swallow whole.    [provider]  Fluticasone-Salmeterol (ADVAIR) 250-50 MCG/DOSE AEPB Inhale 2 puffs into the lungs 2 (two)  times daily.    [provider]  LORazepam (ATIVAN) 0.5 MG tablet 1/2 tablet to be taken just prior to her scan. 02/24/15   Einar Pheasant, MD  meloxicam (MOBIC) 7.5 MG tablet TAKE 1 TABLET BY MOUTH EVERY DAY 12/10/17   Einar Pheasant, MD  metaxalone (SKELAXIN) 800 MG tablet Take 800 mg by mouth 3 (three) times daily. 07/24/17   [provider]   metoprolol succinate (TOPROL-XL) 25 MG 24 hr tablet Take 1 tablet (25 mg total) by mouth 2 (two) times daily. 02/28/18   Einar Pheasant, MD  Multiple Vitamin (MULTIVITAMIN WITH MINERALS) TABS Take 1 tablet by mouth daily.    [provider]  Omega-3 Fatty Acids (FISH OIL) 1000 MG CAPS Take 3,000 mg by mouth daily. 12/29/08   [provider]  omeprazole (PRILOSEC) 20 MG capsule Take 1 capsule (20 mg total) by mouth 2 (two) times daily. 09/05/16   Einar Pheasant, MD  oxybutynin (DITROPAN) 5 MG tablet Take 0.5 tablets (2.5 mg total) by mouth 2 (two) times daily. 12/10/17   Einar Pheasant, MD  oxyCODONE (ROXICODONE) 5 MG immediate release tablet Take 1 tablet (5 mg total) by mouth every 6 (six) hours as needed for breakthrough pain. 03/20/18   Carrie Mew, MD  pantoprazole (PROTONIX) 40 MG tablet Take 1 tablet (40 mg total) by mouth 2 (two) times daily before a meal. 04/09/17   Einar Pheasant, MD  rosuvastatin (CRESTOR) 10 MG tablet Take 1 tablet (10 mg total) by mouth daily. 12/06/17   Einar Pheasant, MD     Allergies Patient has no known allergies.   Family History  Problem Relation Age of Onset  . Heart disease Mother 18       heart attack  . Cancer Father        lung  . Stroke Brother   . Diabetes Brother   . Diabetes Sister   . Breast cancer Sister 35    Social History Social History   Tobacco Use  . Smoking status: Never Smoker  . Smokeless tobacco: Never Used  Substance Use Topics  . Alcohol use: No    Alcohol/week: 0.0 standard drinks  . Drug use: No    Review of Systems  Constitutional:   No fever or chills.  ENT:   No sore throat. No rhinorrhea. Cardiovascular:   No chest pain or syncope. Respiratory:   No dyspnea or cough. Gastrointestinal:   Negative for abdominal pain, vomiting and diarrhea.  Musculoskeletal:   Left shoulder pain as above All other systems reviewed and are negative except as documented above in ROS and  HPI.  ____________________________________________   PHYSICAL EXAM:  VITAL SIGNS: ED Triage Vitals  Enc Vitals Group     BP 03/20/18 1659 (!) 176/90     Pulse Rate 03/20/18 1659 71     Resp 03/20/18 1659 16     Temp 03/20/18 1659 97.6 F (36.4 C)     Temp src --      SpO2 03/20/18 1659 98 %     Weight 03/20/18 1703 178 lb (80.7 kg)     Height 03/20/18 1703 5\' 4"  (1.626 m)     Head Circumference --      Peak Flow --      Pain Score 03/20/18 1702 10     Pain Loc --      Pain Edu? --      Excl. in [place]? --     Vital signs reviewed, nursing assessments reviewed.  Constitutional:   Alert and oriented. Non-toxic appearance. Eyes:   Conjunctivae are normal. EOMI. PERRL. ENT      Head:   Normocephalic and atraumatic.      Nose:   No congestion/rhinnorhea.       Mouth/Throat:   MMM, no pharyngeal erythema. No peritonsillar mass.       Neck:   No meningismus. Full ROM.  No midline spinal tenderness. Hematological/Lymphatic/Immunilogical:   No cervical lymphadenopathy. Cardiovascular:   RRR. Symmetric bilateral radial and DP pulses.  No murmurs. Cap refill less than 2 seconds. Respiratory:   Normal respiratory effort without tachypnea/retractions. Breath sounds are clear and equal bilaterally. No wheezes/rales/rhonchi. Gastrointestinal:   Soft and nontender. Non distended. There is no CVA tenderness.  No rebound, rigidity, or guarding. Musculoskeletal:   Tenderness and swelling over the left proximal humerus.  No lacerations ecchymosis or crepitus.  Other extremities unaffected.  Rest of the humerus elbow forearm hand wrist of the left arm are unremarkable.. Neurologic:   Normal speech and language.  Motor grossly intact. Intact neurologic function of the distal left upper extremity. No acute focal neurologic deficits are appreciated.  Skin:    Skin is warm, dry and intact. No rash noted.  No petechiae, purpura, or bullae.  ____________________________________________    LABS  (pertinent positives/negatives) (all labs ordered are listed, but only abnormal results are displayed) Labs Reviewed - No data to display ____________________________________________   EKG    ____________________________________________    RADIOLOGY  Dg Humerus Left  Result Date: 03/20/2018 CLINICAL DATA:  Fall EXAM: LEFT HUMERUS - 2+ VIEW COMPARISON:  MRI 01/12/2010 FINDINGS: Fracture left humeral neck with impaction and displacement. Comminuted fracture of the humeral head. Mild degenerative change AC joint. IMPRESSION: Comminuted impacted fracture left humeral neck Electronically Signed   By: Franchot Gallo M.D.   On: 03/20/2018 18:12    ____________________________________________   PROCEDURES Procedures  ____________________________________________    CLINICAL IMPRESSION / ASSESSMENT AND PLAN / ED COURSE  Pertinent labs & imaging results that were available during my care of the patient were reviewed by me and considered in my medical decision making (see chart for details).    Patient presents with focal left upper arm pain and tenderness after a mechanical fall.  X-ray shows comminuted fracture at the left proximal humerus at the humeral head resulting in destruction of the anatomic humeral head.  Clinical Course as of Mar 20 1956  Wed Mar 20, 2018  1802 Injury discussed with Dr. Lisette Grinder, recommends sling/immobilizer and office follow-up.  Plan for oral pain control.   [PS]    Clinical Course User Index [PS] Carrie Mew, MD     ----------------------------------------- 8:00 PM on 03/20/2018 -----------------------------------------  Mack Guise made a follow-up call to the ED requesting CT of the left shoulder for further evaluation of the injury and likely surgical planning.  This was completed.  Patient will be placed in a sling, provided pain control.  Recommended Tylenol 1000 mg 3 times daily around-the-clock, oxycodone 5 mg as  needed.  ____________________________________________   FINAL CLINICAL IMPRESSION(S) / ED DIAGNOSES    Final diagnoses:  Other closed displaced fracture of proximal end of left humerus, initial encounter     ED Discharge Orders         Ordered    oxyCODONE (ROXICODONE) 5 MG immediate release tablet  Every 6 hours PRN     03/20/18 1957    acetaminophen (TYLENOL) 500 MG tablet  3 times daily  03/20/18 1957          Portions of this note were generated with dragon dictation software. Dictation errors may occur despite best attempts at proofreading.    Carrie Mew, MD 03/20/18 2000

## 2018-03-20 NOTE — Consult Note (Signed)
ORTHOPAEDIC CONSULTATION  REQUESTING PHYSICIAN: Carrie Mew, MD  Chief Complaint: Closed, comminuted, displaced left proximal humerus fracture   HPI: Angela Cohen is a 77 y.o. female for who I was asked to look at xray films by Dr. Joni Fears.   I have reviewed the films personally.   Patient has a comminuted, displaced fracture of the left proximal humerus.  The humeral head appears to be split, but the glenohumeral joint appears located on plain film.  Discussed case with Dr. Joni Fears in the ER.  Patient is reported to have fallen of a porch prior to her presentation to the ER.  The injury is reported by Dr. Joni Fears to be closed and the patient neurovascularly intact.   Past Medical History:  Diagnosis Date  . Fibrocystic breast disease   . Gastritis    EGD - schatzki's ring, gastritis and gastric polyps  . History of colon polyps   . Hypercholesterolemia   . Hypertension   . MAI (mycobacterium avium-intracellulare) infection ([place])   . Osteoporosis   . Reflux esophagitis    Past Surgical History:  Procedure Laterality Date  . ABDOMINAL HYSTERECTOMY  1998  . BREAST EXCISIONAL BIOPSY Bilateral 1970's   benign  . BREAST EXCISIONAL BIOPSY Bilateral 2010   pt  stated dr Tamala Julian did bilat axilla lumps neg   . BREAST SURGERY     cyst(benign)  . COLONOSCOPY N/A 09/28/2014   Procedure: COLONOSCOPY;  Surgeon: Manya Silvas, MD;  Location: [facility] ENDOSCOPY;  Service: Endoscopy;  Laterality: N/A;  . ESOPHAGOGASTRODUODENOSCOPY N/A 09/28/2014   Procedure: ESOPHAGOGASTRODUODENOSCOPY (EGD);  Surgeon: Manya Silvas, MD;  Location: [facility] ENDOSCOPY;  Service: Endoscopy;  Laterality: N/A;  . ESOPHAGOGASTRODUODENOSCOPY (EGD) WITH PROPOFOL N/A 04/26/2016   Procedure: ESOPHAGOGASTRODUODENOSCOPY (EGD) WITH PROPOFOL;  Surgeon: Manya Silvas, MD;  Location: [facility] ENDOSCOPY;  Service: Endoscopy;  Laterality: N/A;  . excision of right axillary lipoma  10/07  . FOOT SURGERY Right   . HEMORRHOID  SURGERY    . HERNIA REPAIR  10/07  . NASAL SEPTUM SURGERY     Social History   Socioeconomic History  . Marital status: Widowed    Spouse name: Not on file  . Number of children: Not on file  . Years of education: Not on file  . Highest education level: Not on file  Occupational History  . Not on file  Social Needs  . Financial resource strain: Not on file  . Food insecurity:    Worry: Not on file    Inability: Not on file  . Transportation needs:    Medical: Not on file    Non-medical: Not on file  Tobacco Use  . Smoking status: Never Smoker  . Smokeless tobacco: Never Used  Substance and Sexual Activity  . Alcohol use: No    Alcohol/week: 0.0 standard drinks  . Drug use: No  . Sexual activity: Not on file  Lifestyle  . Physical activity:    Days per week: Not on file    Minutes per session: Not on file  . Stress: Not on file  Relationships  . Social connections:    Talks on phone: Not on file    Gets together: Not on file    Attends religious service: Not on file    Active member of club or organization: Not on file    Attends meetings of clubs or organizations: Not on file    Relationship status: Not on file  Other Topics Concern  . Not on file  Social History Narrative  . Not on file   Family History  Problem Relation Age of Onset  . Heart disease Mother 40       heart attack  . Cancer Father        lung  . Stroke Brother   . Diabetes Brother   . Diabetes Sister   . Breast cancer Sister 68   No Known Allergies Prior to Admission medications   Medication Sig Start Date End Date Taking? Authorizing Provider  albuterol (PROVENTIL HFA;VENTOLIN HFA) 108 (90 BASE) MCG/ACT inhaler Inhale 2 puffs into the lungs every 6 (six) hours as needed. 03/27/14 03/28/15  [provider]  aspirin (ASPIRIN EC) 81 MG EC tablet Take 81 mg by mouth daily. Swallow whole.    [provider]  Fluticasone-Salmeterol (ADVAIR) 250-50 MCG/DOSE AEPB Inhale 2  puffs into the lungs 2 (two) times daily.    [provider]  LORazepam (ATIVAN) 0.5 MG tablet 1/2 tablet to be taken just prior to her scan. 02/24/15   Einar Pheasant, MD  meloxicam (MOBIC) 7.5 MG tablet TAKE 1 TABLET BY MOUTH EVERY DAY 12/10/17   Einar Pheasant, MD  metaxalone (SKELAXIN) 800 MG tablet Take 800 mg by mouth 3 (three) times daily. 07/24/17   [provider]  metoprolol succinate (TOPROL-XL) 25 MG 24 hr tablet Take 1 tablet (25 mg total) by mouth 2 (two) times daily. 02/28/18   Einar Pheasant, MD  Multiple Vitamin (MULTIVITAMIN WITH MINERALS) TABS Take 1 tablet by mouth daily.    [provider]  Omega-3 Fatty Acids (FISH OIL) 1000 MG CAPS Take 3,000 mg by mouth daily. 12/29/08   [provider]  omeprazole (PRILOSEC) 20 MG capsule Take 1 capsule (20 mg total) by mouth 2 (two) times daily. 09/05/16   Einar Pheasant, MD  oxybutynin (DITROPAN) 5 MG tablet Take 0.5 tablets (2.5 mg total) by mouth 2 (two) times daily. 12/10/17   Einar Pheasant, MD  pantoprazole (PROTONIX) 40 MG tablet Take 1 tablet (40 mg total) by mouth 2 (two) times daily before a meal. 04/09/17   Einar Pheasant, MD  rosuvastatin (CRESTOR) 10 MG tablet Take 1 tablet (10 mg total) by mouth daily. 12/06/17   Einar Pheasant, MD   No results found.  Assessment: Displaced fracture of left proximal humerus involving humeral head   Plan: Will order a left shoulder CT scan with 3D reconstructions for surgical planning.  She should be placed in a sling or shoulder immobilizer and should call our office tomorrow for a follow up appointment.    Thornton Park, MD    03/20/2018 6:14 PM

## 2018-03-20 NOTE — ED Notes (Signed)
This RN reviewed discharge instructions, follow-up care, prescriptions, cryotherapy, and need for elevation with patient. Patient verbalized understanding of all reviewed information.  Patient stable, with no distress noted at this time. 

## 2018-03-26 DIAGNOSIS — S42292A Other displaced fracture of upper end of left humerus, initial encounter for closed fracture: Secondary | ICD-10-CM | POA: Diagnosis not present

## 2018-03-27 DIAGNOSIS — Z01818 Encounter for other preprocedural examination: Secondary | ICD-10-CM | POA: Diagnosis not present

## 2018-03-27 DIAGNOSIS — Z01812 Encounter for preprocedural laboratory examination: Secondary | ICD-10-CM | POA: Diagnosis not present

## 2018-03-27 DIAGNOSIS — E7800 Pure hypercholesterolemia, unspecified: Secondary | ICD-10-CM | POA: Diagnosis not present

## 2018-03-27 DIAGNOSIS — S42302A Unspecified fracture of shaft of humerus, left arm, initial encounter for closed fracture: Secondary | ICD-10-CM | POA: Diagnosis not present

## 2018-03-27 DIAGNOSIS — S42292A Other displaced fracture of upper end of left humerus, initial encounter for closed fracture: Secondary | ICD-10-CM | POA: Diagnosis not present

## 2018-03-27 DIAGNOSIS — Z01811 Encounter for preprocedural respiratory examination: Secondary | ICD-10-CM | POA: Diagnosis not present

## 2018-03-27 DIAGNOSIS — I10 Essential (primary) hypertension: Secondary | ICD-10-CM | POA: Diagnosis not present

## 2018-03-27 DIAGNOSIS — Z01810 Encounter for preprocedural cardiovascular examination: Secondary | ICD-10-CM | POA: Diagnosis not present

## 2018-03-27 DIAGNOSIS — K219 Gastro-esophageal reflux disease without esophagitis: Secondary | ICD-10-CM | POA: Diagnosis not present

## 2018-03-28 DIAGNOSIS — Z7982 Long term (current) use of aspirin: Secondary | ICD-10-CM | POA: Diagnosis not present

## 2018-03-28 DIAGNOSIS — R32 Unspecified urinary incontinence: Secondary | ICD-10-CM | POA: Diagnosis not present

## 2018-03-28 DIAGNOSIS — E785 Hyperlipidemia, unspecified: Secondary | ICD-10-CM | POA: Diagnosis not present

## 2018-03-28 DIAGNOSIS — I10 Essential (primary) hypertension: Secondary | ICD-10-CM | POA: Diagnosis not present

## 2018-03-28 DIAGNOSIS — M810 Age-related osteoporosis without current pathological fracture: Secondary | ICD-10-CM | POA: Diagnosis not present

## 2018-03-28 DIAGNOSIS — G8918 Other acute postprocedural pain: Secondary | ICD-10-CM | POA: Diagnosis not present

## 2018-03-28 DIAGNOSIS — M25512 Pain in left shoulder: Secondary | ICD-10-CM | POA: Diagnosis not present

## 2018-03-28 DIAGNOSIS — K219 Gastro-esophageal reflux disease without esophagitis: Secondary | ICD-10-CM | POA: Diagnosis not present

## 2018-03-28 DIAGNOSIS — S42242A 4-part fracture of surgical neck of left humerus, initial encounter for closed fracture: Secondary | ICD-10-CM | POA: Diagnosis not present

## 2018-03-28 DIAGNOSIS — Z86718 Personal history of other venous thrombosis and embolism: Secondary | ICD-10-CM | POA: Diagnosis not present

## 2018-03-28 DIAGNOSIS — S42292A Other displaced fracture of upper end of left humerus, initial encounter for closed fracture: Secondary | ICD-10-CM | POA: Diagnosis not present

## 2018-03-29 DIAGNOSIS — K219 Gastro-esophageal reflux disease without esophagitis: Secondary | ICD-10-CM | POA: Diagnosis not present

## 2018-03-29 DIAGNOSIS — R32 Unspecified urinary incontinence: Secondary | ICD-10-CM | POA: Diagnosis not present

## 2018-03-29 DIAGNOSIS — E785 Hyperlipidemia, unspecified: Secondary | ICD-10-CM | POA: Diagnosis not present

## 2018-03-29 DIAGNOSIS — I10 Essential (primary) hypertension: Secondary | ICD-10-CM | POA: Diagnosis not present

## 2018-03-29 DIAGNOSIS — S42292A Other displaced fracture of upper end of left humerus, initial encounter for closed fracture: Secondary | ICD-10-CM | POA: Diagnosis not present

## 2018-03-29 DIAGNOSIS — M810 Age-related osteoporosis without current pathological fracture: Secondary | ICD-10-CM | POA: Diagnosis not present

## 2018-04-09 DIAGNOSIS — M25512 Pain in left shoulder: Secondary | ICD-10-CM | POA: Diagnosis not present

## 2018-04-09 DIAGNOSIS — S42202D Unspecified fracture of upper end of left humerus, subsequent encounter for fracture with routine healing: Secondary | ICD-10-CM | POA: Diagnosis not present

## 2018-04-09 DIAGNOSIS — M6281 Muscle weakness (generalized): Secondary | ICD-10-CM | POA: Diagnosis not present

## 2018-04-13 ENCOUNTER — Other Ambulatory Visit: Payer: Self-pay | Admitting: Internal Medicine

## 2018-04-13 DIAGNOSIS — M25552 Pain in left hip: Secondary | ICD-10-CM

## 2018-04-15 DIAGNOSIS — M6281 Muscle weakness (generalized): Secondary | ICD-10-CM | POA: Diagnosis not present

## 2018-04-15 DIAGNOSIS — M25512 Pain in left shoulder: Secondary | ICD-10-CM | POA: Diagnosis not present

## 2018-04-17 DIAGNOSIS — M25512 Pain in left shoulder: Secondary | ICD-10-CM | POA: Diagnosis not present

## 2018-04-17 DIAGNOSIS — M6281 Muscle weakness (generalized): Secondary | ICD-10-CM | POA: Diagnosis not present

## 2018-04-22 DIAGNOSIS — M25512 Pain in left shoulder: Secondary | ICD-10-CM | POA: Diagnosis not present

## 2018-04-22 DIAGNOSIS — M6281 Muscle weakness (generalized): Secondary | ICD-10-CM | POA: Diagnosis not present

## 2018-04-24 DIAGNOSIS — M25512 Pain in left shoulder: Secondary | ICD-10-CM | POA: Diagnosis not present

## 2018-04-24 DIAGNOSIS — M6281 Muscle weakness (generalized): Secondary | ICD-10-CM | POA: Diagnosis not present

## 2018-04-29 DIAGNOSIS — M25512 Pain in left shoulder: Secondary | ICD-10-CM | POA: Diagnosis not present

## 2018-04-29 DIAGNOSIS — M6281 Muscle weakness (generalized): Secondary | ICD-10-CM | POA: Diagnosis not present

## 2018-04-30 DIAGNOSIS — S42292D Other displaced fracture of upper end of left humerus, subsequent encounter for fracture with routine healing: Secondary | ICD-10-CM | POA: Diagnosis not present

## 2018-05-01 DIAGNOSIS — M25512 Pain in left shoulder: Secondary | ICD-10-CM | POA: Diagnosis not present

## 2018-05-01 DIAGNOSIS — M6281 Muscle weakness (generalized): Secondary | ICD-10-CM | POA: Diagnosis not present

## 2018-05-06 ENCOUNTER — Ambulatory Visit (INDEPENDENT_AMBULATORY_CARE_PROVIDER_SITE_OTHER): Payer: Medicare Other | Admitting: Internal Medicine

## 2018-05-06 ENCOUNTER — Other Ambulatory Visit: Payer: Self-pay

## 2018-05-06 ENCOUNTER — Encounter: Payer: Self-pay | Admitting: Internal Medicine

## 2018-05-06 VITALS — BP 128/78 | HR 101 | Temp 97.7°F | Ht 64.0 in | Wt 182.0 lb

## 2018-05-06 DIAGNOSIS — K219 Gastro-esophageal reflux disease without esophagitis: Secondary | ICD-10-CM | POA: Diagnosis not present

## 2018-05-06 DIAGNOSIS — R1310 Dysphagia, unspecified: Secondary | ICD-10-CM

## 2018-05-06 DIAGNOSIS — R51 Headache: Secondary | ICD-10-CM

## 2018-05-06 DIAGNOSIS — E7800 Pure hypercholesterolemia, unspecified: Secondary | ICD-10-CM | POA: Diagnosis not present

## 2018-05-06 DIAGNOSIS — I10 Essential (primary) hypertension: Secondary | ICD-10-CM | POA: Diagnosis not present

## 2018-05-06 DIAGNOSIS — D329 Benign neoplasm of meninges, unspecified: Secondary | ICD-10-CM | POA: Diagnosis not present

## 2018-05-06 DIAGNOSIS — R519 Headache, unspecified: Secondary | ICD-10-CM

## 2018-05-06 DIAGNOSIS — R739 Hyperglycemia, unspecified: Secondary | ICD-10-CM

## 2018-05-06 LAB — LIPID PANEL
CHOL/HDL RATIO: 2
Cholesterol: 153 mg/dL (ref 0–200)
HDL: 75.7 mg/dL (ref >39.00)
LDL Cholesterol: 58 mg/dL (ref 0–99)
NonHDL: 77.3
TRIGLYCERIDES: 99 mg/dL (ref 0.0–149.0)
VLDL: 19.8 mg/dL (ref 0.0–40.0)

## 2018-05-06 LAB — CBC WITH DIFFERENTIAL/PLATELET
BASOS ABS: 0.1 10*3/uL (ref 0.0–0.1)
Basophils Relative: 2 % (ref 0.0–3.0)
EOS PCT: 5.5 % — AB (ref 0.0–5.0)
Eosinophils Absolute: 0.3 10*3/uL (ref 0.0–0.7)
HCT: 40.3 % (ref 36.0–46.0)
HEMOGLOBIN: 13.6 g/dL (ref 12.0–15.0)
LYMPHS PCT: 20.8 % (ref 12.0–46.0)
Lymphs Abs: 1.1 10*3/uL (ref 0.7–4.0)
MCHC: 33.6 g/dL (ref 30.0–36.0)
MCV: 96.4 fl (ref 78.0–100.0)
MONOS PCT: 8.8 % (ref 3.0–12.0)
Monocytes Absolute: 0.5 10*3/uL (ref 0.1–1.0)
NEUTROS PCT: 62.9 % (ref 43.0–77.0)
Neutro Abs: 3.4 10*3/uL (ref 1.4–7.7)
PLATELETS: 308 10*3/uL (ref 150.0–400.0)
RBC: 4.18 Mil/uL (ref 3.87–5.11)
RDW: 13.5 % (ref 11.5–15.5)
WBC: 5.5 10*3/uL (ref 4.0–10.5)

## 2018-05-06 LAB — HEPATIC FUNCTION PANEL
ALT: 12 U/L (ref 0–35)
AST: 15 U/L (ref 0–37)
Albumin: 4.2 g/dL (ref 3.5–5.2)
Alkaline Phosphatase: 88 U/L (ref 39–117)
BILIRUBIN DIRECT: 0.1 mg/dL (ref 0.0–0.3)
BILIRUBIN TOTAL: 0.4 mg/dL (ref 0.2–1.2)
TOTAL PROTEIN: 7.5 g/dL (ref 6.0–8.3)

## 2018-05-06 LAB — BASIC METABOLIC PANEL
BUN: 11 mg/dL (ref 6–23)
CHLORIDE: 100 meq/L (ref 96–112)
CO2: 30 meq/L (ref 19–32)
CREATININE: 0.79 mg/dL (ref 0.40–1.20)
Calcium: 9.2 mg/dL (ref 8.4–10.5)
GFR: 75.13 mL/min (ref >60.00)
Glucose, Bld: 107 mg/dL — ABNORMAL HIGH (ref 70–99)
Potassium: 4.4 mEq/L (ref 3.5–5.1)
Sodium: 138 mEq/L (ref 135–145)

## 2018-05-06 LAB — HEMOGLOBIN A1C: Hgb A1c MFr Bld: 5.5 % (ref 4.6–6.5)

## 2018-05-06 LAB — SEDIMENTATION RATE: Sed Rate: 48 mm/hr — ABNORMAL HIGH (ref 0–30)

## 2018-05-06 LAB — TSH: TSH: 2.02 u[IU]/mL (ref 0.35–4.50)

## 2018-05-06 MED ORDER — PANTOPRAZOLE SODIUM 40 MG PO TBEC: 40.0000 mg | DELAYED_RELEASE_TABLET | Freq: Two times a day (BID) | ORAL | 1 refills | Status: DC

## 2018-05-06 NOTE — Progress Notes (Signed)
[name], female   DOB: [birth year], 77 y.o.   MRN: [id number]   Subjective:    [name], female    DOB: 22-Aug-1940, 77 y.o.   MRN: [id number]  HPI  Patient here for a scheduled physical, but is s/p recent shoulder fracture.  Wants to hold on physical today.  [place] off a deck.  Fractured her shoulder.  S/p surgery.  Going to therapy.  Followed by ortho.  Trying to stay active.  Shoulder limiting her activity.  No chest pain.  No sob.  No acid reflux.  No abdominal pain. Bowels moving.  Does report noticing headache.  Has been intermittent for months.  Described feeling like a band around her head.  Is not constant.  May occur one time per week.  No associated vision changes.  Has not seen eye doctor recently.  No sinus pressure.  Minimal nasal congestion.  No known triggers.  Not able to sleep in her bed.  Has been sleeping in the recliner - secondary to her shoulder.  Taking aleve prn.  Taking aloe to help her swallowing.  Swallowing is better.     Past Medical History:  Diagnosis Date  . Fibrocystic breast disease   . Gastritis    EGD - schatzki's ring, gastritis and gastric polyps  . History of colon polyps   . Hypercholesterolemia   . Hypertension   . MAI (mycobacterium avium-intracellulare) infection ([place])   . Osteoporosis   . Reflux esophagitis    Past Surgical History:  Procedure Laterality Date  . ABDOMINAL HYSTERECTOMY  1998  . BREAST EXCISIONAL BIOPSY Bilateral 1970's   benign  . BREAST EXCISIONAL BIOPSY Bilateral 2010   pt  stated dr Tamala Julian did bilat axilla lumps neg   . BREAST SURGERY     cyst(benign)  . COLONOSCOPY N/A 09/28/2014   Procedure: COLONOSCOPY;  Surgeon: Manya Silvas, MD;  Location: [facility] ENDOSCOPY;  Service: Endoscopy;  Laterality: N/A;  . ESOPHAGOGASTRODUODENOSCOPY N/A 09/28/2014   Procedure: ESOPHAGOGASTRODUODENOSCOPY (EGD);  Surgeon: Manya Silvas, MD;  Location: [facility] ENDOSCOPY;  Service: Endoscopy;  Laterality: N/A;  .  ESOPHAGOGASTRODUODENOSCOPY (EGD) WITH PROPOFOL N/A 04/26/2016   Procedure: ESOPHAGOGASTRODUODENOSCOPY (EGD) WITH PROPOFOL;  Surgeon: Manya Silvas, MD;  Location: [facility] ENDOSCOPY;  Service: Endoscopy;  Laterality: N/A;  . excision of right axillary lipoma  10/07  . FOOT SURGERY Right   . HEMORRHOID SURGERY    . HERNIA REPAIR  10/07  . NASAL SEPTUM SURGERY     Family History  Problem Relation Age of Onset  . Heart disease Mother 73       heart attack  . Cancer Father        lung  . Stroke Brother   . Diabetes Brother   . Diabetes Sister   . Breast cancer Sister 56   Social History   Socioeconomic History  . Marital status: Widowed    Spouse name: Not on file  . Number of children: Not on file  . Years of education: Not on file  . Highest education level: Not on file  Occupational History  . Not on file  Social Needs  . Financial resource strain: Not on file  . Food insecurity:    Worry: Not on file    Inability: Not on file  . Transportation needs:    Medical: Not on file    Non-medical: Not on file  Tobacco Use  . Smoking status: Never Smoker  . Smokeless  tobacco: Never Used  Substance and Sexual Activity  . Alcohol use: No    Alcohol/week: 0.0 standard drinks  . Drug use: No  . Sexual activity: Not on file  Lifestyle  . Physical activity:    Days per week: Not on file    Minutes per session: Not on file  . Stress: Not on file  Relationships  . Social connections:    Talks on phone: Not on file    Gets together: Not on file    Attends religious service: Not on file    Active member of club or organization: Not on file    Attends meetings of clubs or organizations: Not on file    Relationship status: Not on file  Other Topics Concern  . Not on file  Social History Narrative  . Not on file    Outpatient Encounter Medications as of 05/06/2018  Medication Sig  . aspirin (ASPIRIN EC) 81 MG EC tablet Take 81 mg by mouth daily. Swallow whole.  Marland Kitchen  Fluticasone-Salmeterol (ADVAIR) 250-50 MCG/DOSE AEPB Inhale 2 puffs into the lungs 2 (two) times daily.  Marland Kitchen LORazepam (ATIVAN) 0.5 MG tablet 1/2 tablet to be taken just prior to her scan.  . metoprolol succinate (TOPROL-XL) 25 MG 24 hr tablet Take 1 tablet (25 mg total) by mouth 2 (two) times daily.  . Multiple Vitamin (MULTIVITAMIN WITH MINERALS) TABS Take 1 tablet by mouth daily.  . Omega-3 Fatty Acids (FISH OIL) 1000 MG CAPS Take 3,000 mg by mouth daily.  . rosuvastatin (CRESTOR) 10 MG tablet Take 1 tablet (10 mg total) by mouth daily.  . [DISCONTINUED] pantoprazole (PROTONIX) 40 MG tablet Take 1 tablet (40 mg total) by mouth 2 (two) times daily before a meal.  . albuterol (PROVENTIL HFA;VENTOLIN HFA) 108 (90 BASE) MCG/ACT inhaler Inhale 2 puffs into the lungs every 6 (six) hours as needed.  Marland Kitchen omeprazole (PRILOSEC) 20 MG capsule Take 1 capsule (20 mg total) by mouth 2 (two) times daily.  . [DISCONTINUED] meloxicam (MOBIC) 7.5 MG tablet TAKE 1 TABLET BY MOUTH EVERY DAY (Patient not taking: Reported on 05/06/2018)  . [DISCONTINUED] metaxalone (SKELAXIN) 800 MG tablet Take 800 mg by mouth 3 (three) times daily.  . [DISCONTINUED] oxybutynin (DITROPAN) 5 MG tablet Take 0.5 tablets (2.5 mg total) by mouth 2 (two) times daily. (Patient not taking: Reported on 05/06/2018)  . [DISCONTINUED] oxyCODONE (ROXICODONE) 5 MG immediate release tablet Take 1 tablet (5 mg total) by mouth every 6 (six) hours as needed for breakthrough pain. (Patient not taking: Reported on 05/06/2018)   No facility-administered encounter medications on file as of 05/06/2018.     Review of Systems  Constitutional: Negative for appetite change and unexpected weight change.  HENT: Negative for postnasal drip and sinus pressure.        Minimal nasal congestion.    Respiratory: Negative for cough, chest tightness and shortness of breath.   Cardiovascular: Negative for chest pain, palpitations and leg swelling.  Gastrointestinal:  Negative for abdominal pain, diarrhea, nausea and vomiting.  Genitourinary: Negative for difficulty urinating and dysuria.  Musculoskeletal: Negative for myalgias.       S/p shoulder fracture.  Some persistent pain.    Skin: Negative for color change and rash.  Neurological: Negative for dizziness, light-headedness and headaches.  Psychiatric/Behavioral: Negative for agitation and dysphoric mood.       Objective:    Physical Exam Constitutional:      General: She is not in acute distress.  Appearance: Normal appearance.  HENT:     Nose: Nose normal. No congestion.     Mouth/Throat:     Pharynx: No oropharyngeal exudate or posterior oropharyngeal erythema.  Neck:     Musculoskeletal: Neck supple. No muscular tenderness.     Thyroid: No thyromegaly.  Cardiovascular:     Rate and Rhythm: Normal rate and regular rhythm.  Pulmonary:     Effort: No respiratory distress.     Breath sounds: Normal breath sounds. No wheezing.  Abdominal:     General: Bowel sounds are normal.     Palpations: Abdomen is soft.     Tenderness: There is no abdominal tenderness.  Musculoskeletal:        General: No swelling.     Comments: S/p shoulder fracture - in sling.    Lymphadenopathy:     Cervical: No cervical adenopathy.  Skin:    Findings: No erythema or rash.  Neurological:     Mental Status: She is alert.     Comments: No pain to palpation - head/temporal region.   Psychiatric:        Mood and Affect: Mood normal.        Behavior: Behavior normal.     BP 128/78 (BP Location: Right Arm, Patient Position: Sitting, Cuff Size: Large)   Pulse (!) 101   Temp 97.7 F (36.5 C)   Ht '5\' 4"'$  (1.626 m)   Wt 182 lb (82.6 kg)   SpO2 97%   BMI 31.24 kg/m  Wt Readings from Last 3 Encounters:  05/06/18 182 lb (82.6 kg)  03/20/18 178 lb (80.7 kg)  10/10/17 190 lb (86.2 kg)     Lab Results  Component Value Date   WBC 5.5 05/06/2018   HGB 13.6 05/06/2018   HCT 40.3 05/06/2018   PLT 308.0  05/06/2018   GLUCOSE 107 (H) 05/06/2018   CHOL 153 05/06/2018   TRIG 99.0 05/06/2018   HDL 75.70 05/06/2018   LDLCALC 58 05/06/2018   ALT 12 05/06/2018   AST 15 05/06/2018   NA 138 05/06/2018   K 4.4 05/06/2018   CL 100 05/06/2018   CREATININE 0.79 05/06/2018   BUN 11 05/06/2018   CO2 30 05/06/2018   TSH 2.02 05/06/2018   HGBA1C 5.5 05/06/2018    Ct Shoulder Left Wo Contrast  Result Date: 03/20/2018 CLINICAL DATA:  Shoulder fracture EXAM: CT OF THE UPPER LEFT EXTREMITY WITHOUT CONTRAST TECHNIQUE: Multidetector CT imaging of the upper left extremity was performed according to the standard protocol. 3-dimensional CT images were rendered by post-processing of the original CT data on an acquisition workstation. The 3-dimensional CT images were interpreted and findings were reported in the accompanying complete CT report for this study COMPARISON:  Radiographs 03/20/2018 FINDINGS: Bones/Joint/Cartilage Left scapula is intact. Moderate AC joint degenerative change. Left clavicle shows no fracture. Possible left third lateral rib fracture. Acute markedly comminuted fracture involving the left humeral head and neck. Fracture is impacted and there are multiple displaced bone fragments. An approximate 3.4 cm humeral head fragment appears rotated and displaced inferiorly and toward the midline. Underlying bone demonstrates subtle sclerosis. Heterogenous density in the marrow of the fractured proximal humerus. Faint calcifications along the surface of the glenoid. Ligaments Suboptimally assessed by CT. Muscles and Tendons No significant muscular atrophy.  No large intramuscular hematoma. Soft tissues Edema within the soft tissues of the left shoulder. IMPRESSION: 1. Acute impacted and markedly comminuted intra-articular fracture involving the left humeral head and neck. Mild inferior subluxation  at the left glenohumeral interval with an inferiorly displaced and slightly rotated humeral head fracture fragment  2. Slight sclerosis within the left humeral head and neck with heterogenous density on soft tissue windows in the region of fracture, suggesting possible underlying osseous lesion/pathologic fracture. 3. Moderate AC joint degenerative change Electronically Signed   By: Donavan Foil M.D.   On: 03/20/2018 20:01   Ct 3d Recon At Scanner  Result Date: 03/20/2018 CLINICAL DATA:  Shoulder fracture EXAM: CT OF THE UPPER LEFT EXTREMITY WITHOUT CONTRAST TECHNIQUE: Multidetector CT imaging of the upper left extremity was performed according to the standard protocol. 3-dimensional CT images were rendered by post-processing of the original CT data on an acquisition workstation. The 3-dimensional CT images were interpreted and findings were reported in the accompanying complete CT report for this study COMPARISON:  Radiographs 03/20/2018 FINDINGS: Bones/Joint/Cartilage Left scapula is intact. Moderate AC joint degenerative change. Left clavicle shows no fracture. Possible left third lateral rib fracture. Acute markedly comminuted fracture involving the left humeral head and neck. Fracture is impacted and there are multiple displaced bone fragments. An approximate 3.4 cm humeral head fragment appears rotated and displaced inferiorly and toward the midline. Underlying bone demonstrates subtle sclerosis. Heterogenous density in the marrow of the fractured proximal humerus. Faint calcifications along the surface of the glenoid. Ligaments Suboptimally assessed by CT. Muscles and Tendons No significant muscular atrophy.  No large intramuscular hematoma. Soft tissues Edema within the soft tissues of the left shoulder. IMPRESSION: 1. Acute impacted and markedly comminuted intra-articular fracture involving the left humeral head and neck. Mild inferior subluxation at the left glenohumeral interval with an inferiorly displaced and slightly rotated humeral head fracture fragment 2. Slight sclerosis within the left humeral head and  neck with heterogenous density on soft tissue windows in the region of fracture, suggesting possible underlying osseous lesion/pathologic fracture. 3. Moderate AC joint degenerative change Electronically Signed   By: Donavan Foil M.D.   On: 03/20/2018 20:01   Dg Humerus Left  Result Date: 03/20/2018 CLINICAL DATA:  Fall EXAM: LEFT HUMERUS - 2+ VIEW COMPARISON:  MRI 01/12/2010 FINDINGS: Fracture left humeral neck with impaction and displacement. Comminuted fracture of the humeral head. Mild degenerative change AC joint. IMPRESSION: Comminuted impacted fracture left humeral neck Electronically Signed   By: Franchot Gallo M.D.   On: 03/20/2018 18:12       Assessment & Plan:   Problem List Items Addressed This Visit    Dysphagia    Swallowing is better.  Declines any further evaluation.      Essential hypertension, benign - Primary    Blood pressure under good control.  Continue same medication regimen.  Follow pressures.  Follow metabolic panel.        Relevant Orders   CBC with Differential/Platelet (Completed)   TSH (Completed)   Basic metabolic panel (Completed)   GERD (gastroesophageal reflux disease)    Controlled on current regimen.  Follow.        Headache    Has a history of occipital neuralgia.  Saw Dr Manuella Ghazi.  Has meningioma.  Has been stable.  Persistent intermittent headache as outlined.  Described as a band around her head.  Discussed tension headaches.  Has not had eyes checked recently.  Refer to ophthalmology.  Doubt temporal arteritis given presentation/symptoms.  No neck symptoms.  Has been a persistent/intermittent issue.  Discuss with ortho if she is able to have MRI.  Call in to ortho given recent surgery.  Start magnesium.  Relevant Orders   Sedimentation rate (Completed)   Ambulatory referral to Ophthalmology   Hypercholesterolemia    On crestor.  Low cholesterol diet and exercise.  Follow lipid panel and liver function tests.        Relevant Orders    Hepatic function panel (Completed)   Lipid panel (Completed)   Hyperglycemia    Low carb diet and exercise.  Follow met b and a1c.        Relevant Orders   Hemoglobin A1c (Completed)   Meningioma (HCC)    Has ben followed by neurology.          I spent 40 minutes with the patient and more than 50% of the time was spent in consultation regarding the above.  Time spent discussing her current symptoms and plans for further w/up and treatment.    Einar Pheasant, MD

## 2018-05-12 ENCOUNTER — Encounter: Payer: Self-pay | Admitting: Internal Medicine

## 2018-05-12 NOTE — Assessment & Plan Note (Signed)
On crestor.  Low cholesterol diet and exercise.  Follow lipid panel and liver function tests.   

## 2018-05-12 NOTE — Assessment & Plan Note (Addendum)
Has a history of occipital neuralgia.  Saw Dr Manuella Ghazi.  Has meningioma.  Has been stable.  Persistent intermittent headache as outlined.  Described as a band around her head.  Discussed tension headaches.  Has not had eyes checked recently.  Refer to ophthalmology.  Doubt temporal arteritis given presentation/symptoms.  No neck symptoms.  Has been a persistent/intermittent issue.  Discuss with ortho if she is able to have MRI.  Call in to ortho given recent surgery.  Start magnesium.

## 2018-05-12 NOTE — Assessment & Plan Note (Signed)
Blood pressure under good control.  Continue same medication regimen.  Follow pressures.  Follow metabolic panel.   

## 2018-05-12 NOTE — Assessment & Plan Note (Signed)
Low carb diet and exercise.  Follow met b and a1c.   

## 2018-05-12 NOTE — Assessment & Plan Note (Signed)
Controlled on current regimen.  Follow.  

## 2018-05-12 NOTE — Assessment & Plan Note (Signed)
Has ben followed by neurology.

## 2018-05-12 NOTE — Assessment & Plan Note (Signed)
Swallowing is better.  Declines any further evaluation.

## 2018-05-13 DIAGNOSIS — M25512 Pain in left shoulder: Secondary | ICD-10-CM | POA: Diagnosis not present

## 2018-05-13 DIAGNOSIS — M6281 Muscle weakness (generalized): Secondary | ICD-10-CM | POA: Diagnosis not present

## 2018-05-19 DIAGNOSIS — J45909 Unspecified asthma, uncomplicated: Secondary | ICD-10-CM | POA: Diagnosis not present

## 2018-05-19 DIAGNOSIS — M542 Cervicalgia: Secondary | ICD-10-CM | POA: Diagnosis not present

## 2018-05-19 DIAGNOSIS — Z7951 Long term (current) use of inhaled steroids: Secondary | ICD-10-CM | POA: Diagnosis not present

## 2018-05-19 DIAGNOSIS — R51 Headache: Secondary | ICD-10-CM | POA: Diagnosis not present

## 2018-05-19 DIAGNOSIS — H538 Other visual disturbances: Secondary | ICD-10-CM | POA: Diagnosis not present

## 2018-05-19 DIAGNOSIS — K219 Gastro-esophageal reflux disease without esophagitis: Secondary | ICD-10-CM | POA: Diagnosis not present

## 2018-05-19 DIAGNOSIS — L539 Erythematous condition, unspecified: Secondary | ICD-10-CM | POA: Diagnosis not present

## 2018-05-19 DIAGNOSIS — Z7982 Long term (current) use of aspirin: Secondary | ICD-10-CM | POA: Diagnosis not present

## 2018-05-19 DIAGNOSIS — R000 Tachycardia, unspecified: Secondary | ICD-10-CM | POA: Diagnosis not present

## 2018-05-20 DIAGNOSIS — M25512 Pain in left shoulder: Secondary | ICD-10-CM | POA: Diagnosis not present

## 2018-05-20 DIAGNOSIS — M6281 Muscle weakness (generalized): Secondary | ICD-10-CM | POA: Diagnosis not present

## 2018-05-22 ENCOUNTER — Other Ambulatory Visit: Payer: Self-pay | Admitting: Internal Medicine

## 2018-05-22 DIAGNOSIS — M25512 Pain in left shoulder: Secondary | ICD-10-CM | POA: Diagnosis not present

## 2018-05-22 DIAGNOSIS — M6281 Muscle weakness (generalized): Secondary | ICD-10-CM | POA: Diagnosis not present

## 2018-05-27 DIAGNOSIS — M6281 Muscle weakness (generalized): Secondary | ICD-10-CM | POA: Diagnosis not present

## 2018-05-27 DIAGNOSIS — M25512 Pain in left shoulder: Secondary | ICD-10-CM | POA: Diagnosis not present

## 2018-05-28 DIAGNOSIS — S42202D Unspecified fracture of upper end of left humerus, subsequent encounter for fracture with routine healing: Secondary | ICD-10-CM | POA: Diagnosis not present

## 2018-05-28 DIAGNOSIS — M25532 Pain in left wrist: Secondary | ICD-10-CM | POA: Diagnosis not present

## 2018-05-29 DIAGNOSIS — M25512 Pain in left shoulder: Secondary | ICD-10-CM | POA: Diagnosis not present

## 2018-05-29 DIAGNOSIS — M6281 Muscle weakness (generalized): Secondary | ICD-10-CM | POA: Diagnosis not present

## 2018-06-03 DIAGNOSIS — M25512 Pain in left shoulder: Secondary | ICD-10-CM | POA: Diagnosis not present

## 2018-06-03 DIAGNOSIS — M6281 Muscle weakness (generalized): Secondary | ICD-10-CM | POA: Diagnosis not present

## 2018-06-05 DIAGNOSIS — M6281 Muscle weakness (generalized): Secondary | ICD-10-CM | POA: Diagnosis not present

## 2018-06-05 DIAGNOSIS — M25512 Pain in left shoulder: Secondary | ICD-10-CM | POA: Diagnosis not present

## 2018-06-10 DIAGNOSIS — M25512 Pain in left shoulder: Secondary | ICD-10-CM | POA: Diagnosis not present

## 2018-06-10 DIAGNOSIS — M6281 Muscle weakness (generalized): Secondary | ICD-10-CM | POA: Diagnosis not present

## 2018-06-12 DIAGNOSIS — M6281 Muscle weakness (generalized): Secondary | ICD-10-CM | POA: Diagnosis not present

## 2018-06-12 DIAGNOSIS — M25512 Pain in left shoulder: Secondary | ICD-10-CM | POA: Diagnosis not present

## 2018-06-17 DIAGNOSIS — M25512 Pain in left shoulder: Secondary | ICD-10-CM | POA: Diagnosis not present

## 2018-06-17 DIAGNOSIS — M6281 Muscle weakness (generalized): Secondary | ICD-10-CM | POA: Diagnosis not present

## 2018-06-19 DIAGNOSIS — M6281 Muscle weakness (generalized): Secondary | ICD-10-CM | POA: Diagnosis not present

## 2018-06-19 DIAGNOSIS — M25512 Pain in left shoulder: Secondary | ICD-10-CM | POA: Diagnosis not present

## 2018-06-24 DIAGNOSIS — M6281 Muscle weakness (generalized): Secondary | ICD-10-CM | POA: Diagnosis not present

## 2018-06-24 DIAGNOSIS — M25512 Pain in left shoulder: Secondary | ICD-10-CM | POA: Diagnosis not present

## 2018-06-26 DIAGNOSIS — M25512 Pain in left shoulder: Secondary | ICD-10-CM | POA: Diagnosis not present

## 2018-06-26 DIAGNOSIS — M6281 Muscle weakness (generalized): Secondary | ICD-10-CM | POA: Diagnosis not present

## 2018-07-01 DIAGNOSIS — M6281 Muscle weakness (generalized): Secondary | ICD-10-CM | POA: Diagnosis not present

## 2018-07-01 DIAGNOSIS — M25512 Pain in left shoulder: Secondary | ICD-10-CM | POA: Diagnosis not present

## 2018-07-09 DIAGNOSIS — S52502A Unspecified fracture of the lower end of left radius, initial encounter for closed fracture: Secondary | ICD-10-CM | POA: Diagnosis not present

## 2018-07-09 DIAGNOSIS — S42202D Unspecified fracture of upper end of left humerus, subsequent encounter for fracture with routine healing: Secondary | ICD-10-CM | POA: Diagnosis not present

## 2018-07-17 DIAGNOSIS — D485 Neoplasm of uncertain behavior of skin: Secondary | ICD-10-CM | POA: Diagnosis not present

## 2018-07-17 DIAGNOSIS — L089 Local infection of the skin and subcutaneous tissue, unspecified: Secondary | ICD-10-CM | POA: Diagnosis not present

## 2018-07-17 DIAGNOSIS — L578 Other skin changes due to chronic exposure to nonionizing radiation: Secondary | ICD-10-CM | POA: Diagnosis not present

## 2018-07-17 DIAGNOSIS — Z872 Personal history of diseases of the skin and subcutaneous tissue: Secondary | ICD-10-CM | POA: Diagnosis not present

## 2018-07-17 DIAGNOSIS — L570 Actinic keratosis: Secondary | ICD-10-CM | POA: Diagnosis not present

## 2018-07-17 DIAGNOSIS — L821 Other seborrheic keratosis: Secondary | ICD-10-CM | POA: Diagnosis not present

## 2018-07-17 DIAGNOSIS — Z08 Encounter for follow-up examination after completed treatment for malignant neoplasm: Secondary | ICD-10-CM | POA: Diagnosis not present

## 2018-07-17 DIAGNOSIS — X32XXXA Exposure to sunlight, initial encounter: Secondary | ICD-10-CM | POA: Diagnosis not present

## 2018-07-17 DIAGNOSIS — Z85828 Personal history of other malignant neoplasm of skin: Secondary | ICD-10-CM | POA: Diagnosis not present

## 2018-08-03 IMAGING — US US EXTREM UP*L* LTD
1 series · 8 of 8 positions shown · non-contrast
Comparison: Previous exam(s).

CLINICAL DATA: 75-year-old female with bilateral axillary pain. The
patient denies any lumps. The patient reports bilateral axillary
surgery in approximately 8242 by Dr. Hernando for benign lumps.

EXAM:
2D DIGITAL DIAGNOSTIC BILATERAL MAMMOGRAM WITH CAD AND ADJUNCT TOMO
ULTRASOUND BILATERAL AXILLA

[Series 1: us extrem up*left* ltd · 0.09mm/px · 8 of 8 slices shown]
[im 1/8]
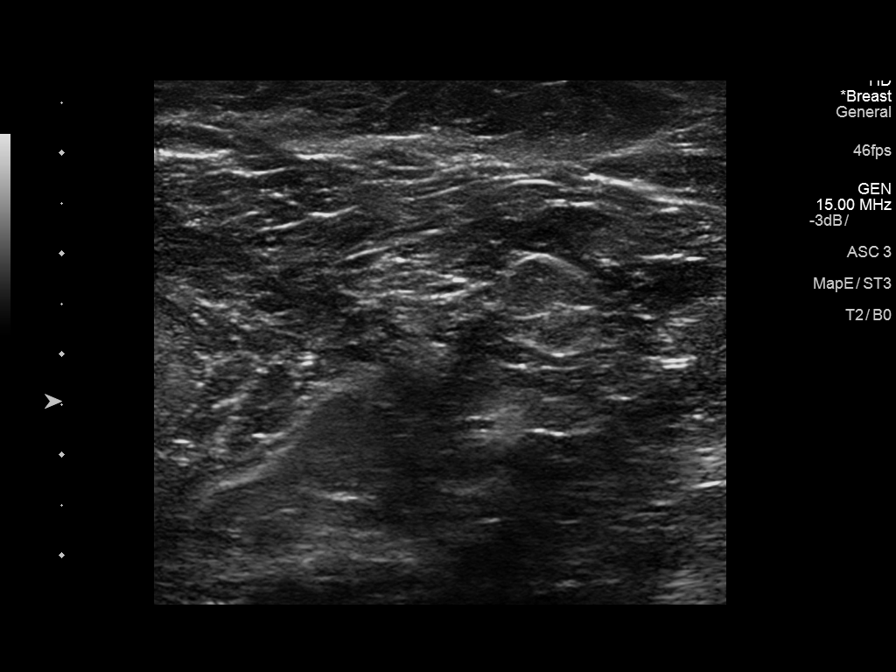
[im 2/8]
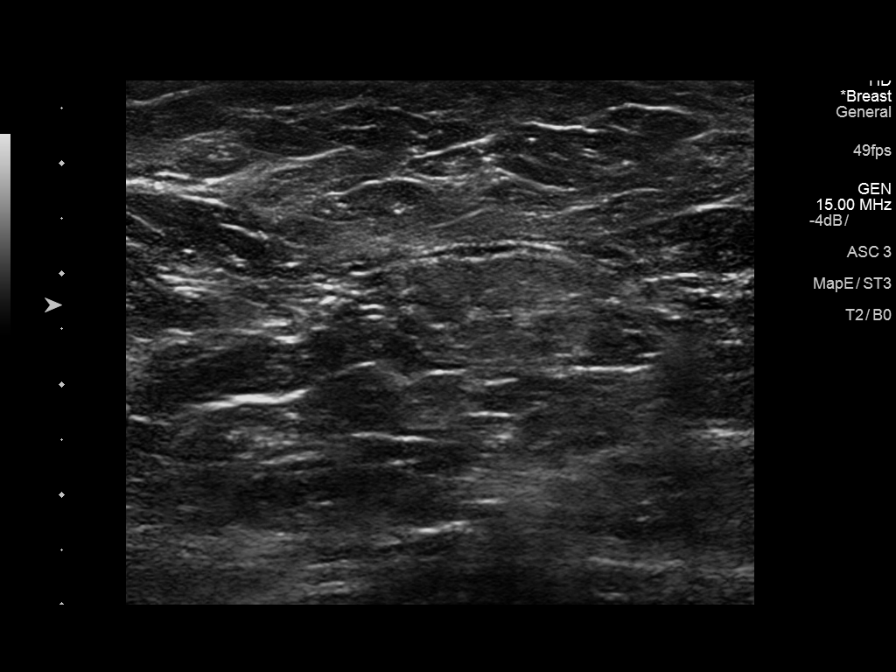
[im 3/8]
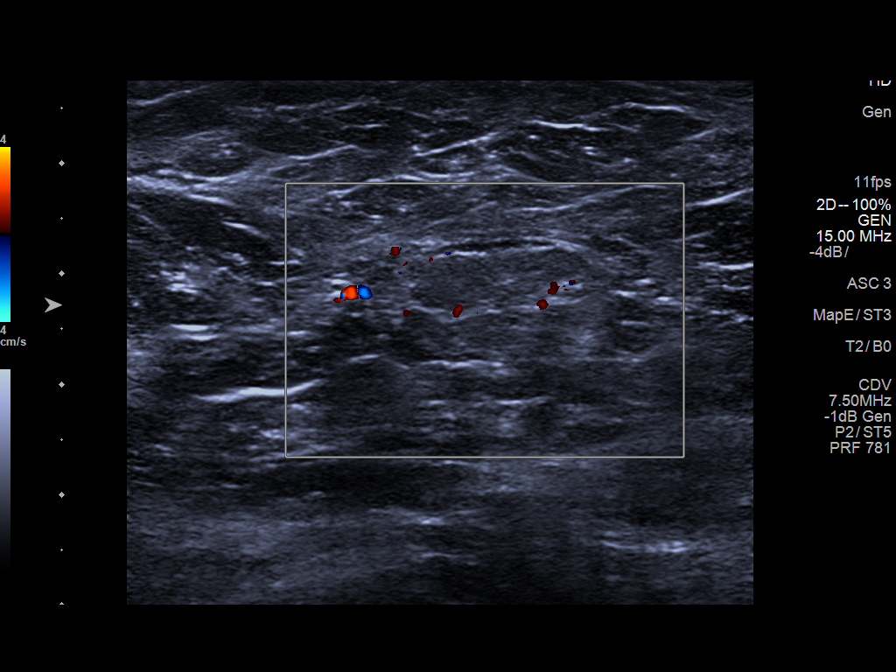
[im 4/8]
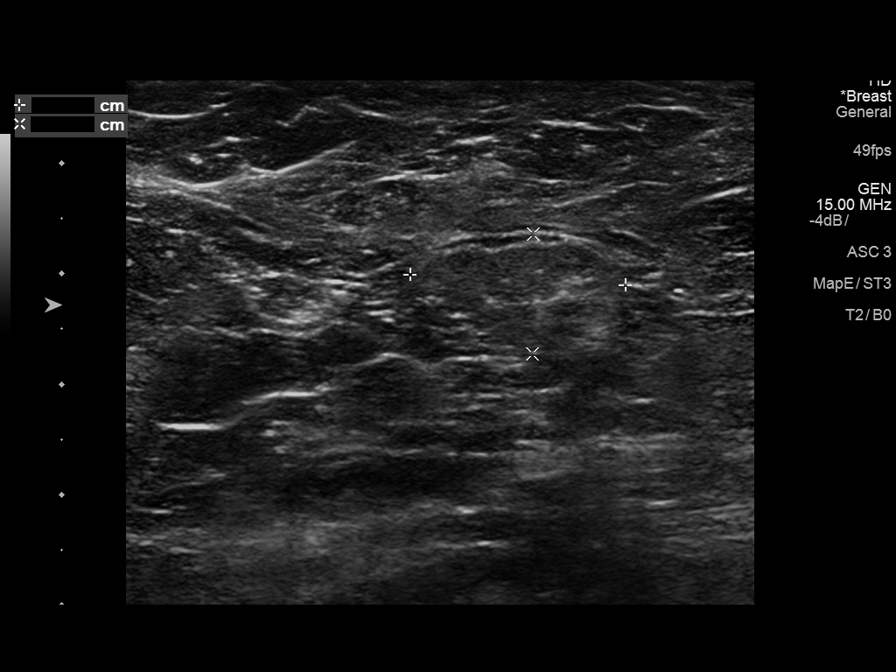
[im 5/8]
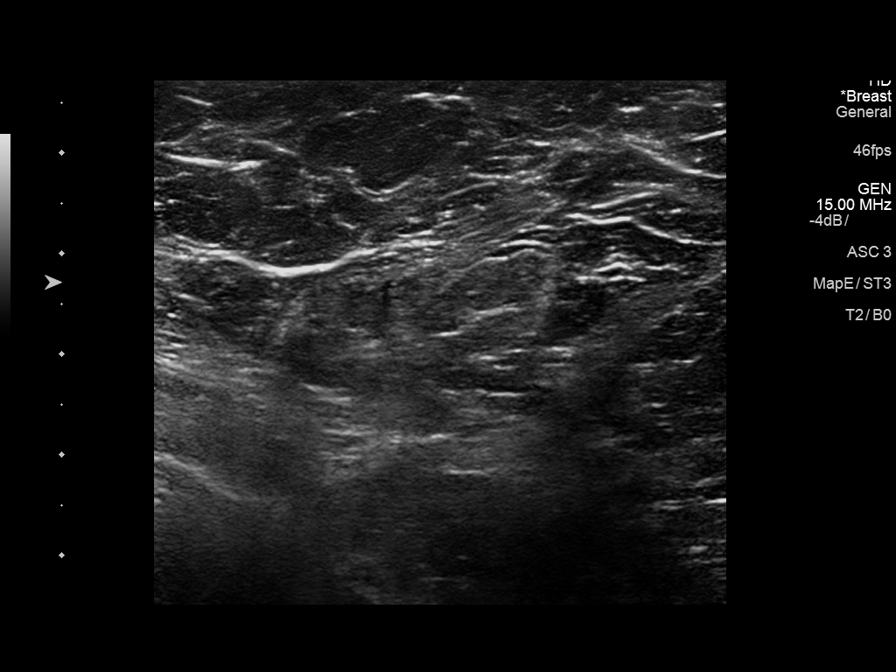
[im 6/8]
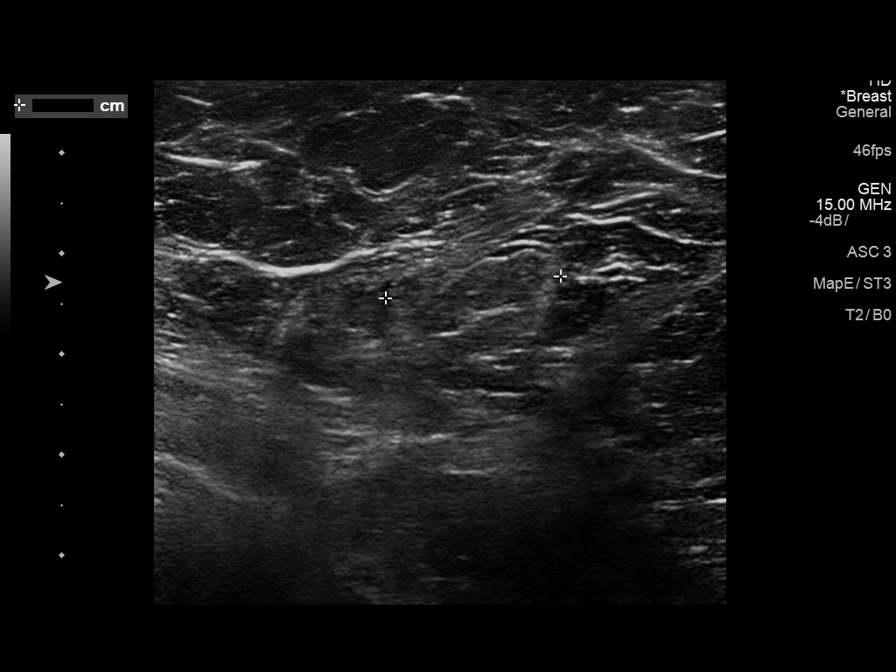
[im 7/8]
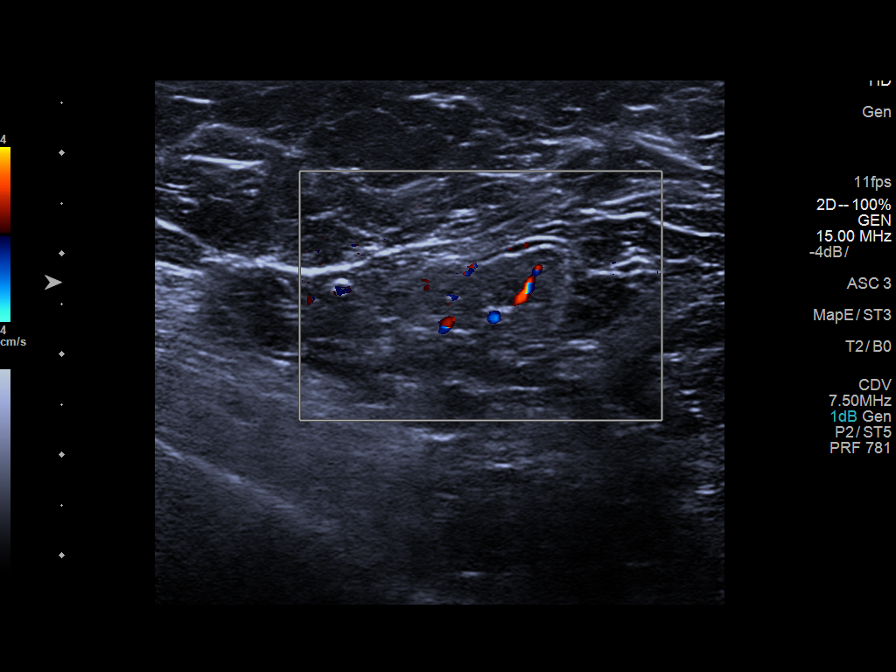
[im 8/8]
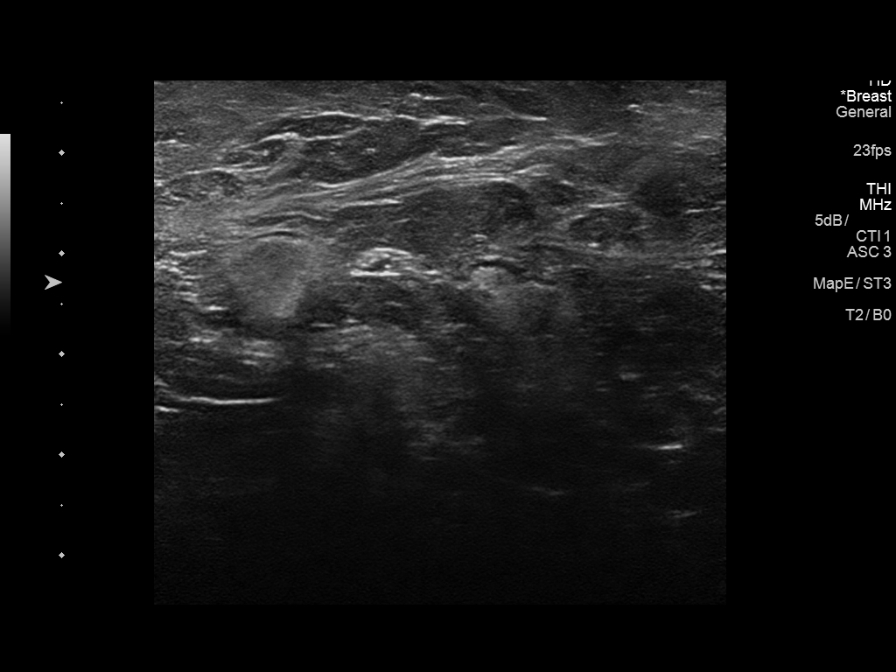

[8 of 8 positions shown; findings below may reference images not displayed]

ACR Breast Density Category b: There are scattered areas of
fibroglandular density.
FINDINGS: No suspicious mass, calcifications, or other abnormality is
identified within either breast.

Mammographic images were processed with CAD.

On physical exam, there is scarring in the bilateral axilla without
discrete mass.

Targeted ultrasound of the bilateral axilla was performed
demonstrating no suspicious cystic or solid sonographic finding in
the areas of concern.
IMPRESSION: No mammographic or sonographic evidence of malignancy.

RECOMMENDATION:
1. Screening mammogram in one year.(Code:AX-8-4OP)
2. The patient was instructed to follow-up with her referring
clinician regarding her axillary pain.

I have discussed the findings and recommendations with the patient.
Results were also provided in writing at the conclusion of the
visit. If applicable, a reminder letter will be sent to the patient
regarding the next appointment.

BI-RADS CATEGORY  1: Negative.

## 2018-08-12 ENCOUNTER — Ambulatory Visit: Payer: Self-pay

## 2018-08-12 ENCOUNTER — Telehealth: Payer: Self-pay | Admitting: Internal Medicine

## 2018-08-12 MED ORDER — ALBUTEROL SULFATE HFA 108 (90 BASE) MCG/ACT IN AERS: 2.0000 | INHALATION_SPRAY | Freq: Four times a day (QID) | RESPIRATORY_TRACT | 1 refills | Status: DC | PRN

## 2018-08-12 NOTE — Telephone Encounter (Signed)
Copied from [place] [id number]. Topic: General - Other >> Aug 12, 2018 12:34 PM Lennox Solders wrote: Reason for CRM: Pt has been having sob intermittently and she is talking with triage nurse. Pt does not want to come in . Pt had shoulder surgery unable to drive. [facility] [address]. Pt would like albuterol inhaler. Pt was last seen in dec 2019

## 2018-08-12 NOTE — Telephone Encounter (Signed)
rx sent in for albuterol inhaler.  Please notify pt that if symptoms change or any worsening problems, needs to let us know or be evaluated.

## 2018-08-12 NOTE — Addendum Note (Signed)
Addended by: Alisa Graff on: 08/12/2018 02:46 PM   Modules accepted: Orders

## 2018-08-12 NOTE — Telephone Encounter (Signed)
Pt called to report SOB with walking fast or up doing physical exertion. Pt stated when she has this issue, she uses her inhaler and sx resolve. Pt stated she accidentally threw away her inhaler and is requesting another on to be sent. Pt denies fever, heart issues. Pt does have h/o asthma. Pt does not want to come in for evaluation. Pt does not have emil. No international or domestic [place] travel or known exposure to [place]. Care advice given and routing message to office.   Reason for Disposition . [1] MILD difficulty breathing (e.g., minimal/no SOB at rest, SOB with walking, pulse <100) AND [2] NEW-onset or WORSE than normal  Answer Assessment - Initial Assessment Questions 1. RESPIRATORY STATUS: "Describe your breathing?" (e.g., wheezing, shortness of breath, unable to speak, severe coughing)      SOB with exertion 2. ONSET: "When did this breathing problem begin?"     2 weeks ago 3. PATTERN "Does the difficult breathing come and go, or has it been constant since it started?"     Comes and goes 4. SEVERITY: "How bad is your breathing?" (e.g., mild, moderate, severe)    - MILD: No SOB at rest, mild SOB with walking, speaks normally in sentences, can lay down, no retractions, pulse < 100.    - MODERATE: SOB at rest, SOB with minimal exertion and prefers to sit, cannot lie down flat, speaks in phrases, mild retractions, audible wheezing, pulse 100-120.    - SEVERE: Very SOB at rest, speaks in single words, struggling to breathe, sitting hunched forward, retractions, pulse > 120     mild 5. RECURRENT SYMPTOM: "Have you had difficulty breathing before?" If so, ask: "When was the last time?" and "What happened that time?"      Yes but the inhaler always helps 6. CARDIAC HISTORY: "Do you have any history of heart disease?" (e.g., heart attack, angina, bypass surgery, angioplasty)      no 7. LUNG HISTORY: "Do you have any history of lung disease?"  (e.g., pulmonary embolus, asthma, emphysema)  asthma 8. CAUSE: "What do you think is causing the breathing problem?"      pollen 9. OTHER SYMPTOMS: "Do you have any other symptoms? (e.g., dizziness, runny nose, cough, chest pain, fever)     no 10. PREGNANCY: "Is there any chance you are pregnant?" "When was your last menstrual period?"       n/a 11. TRAVEL: "Have you traveled out of the country in the last month?" (e.g., travel history, exposures)       *No Answer*  Protocols used: BREATHING DIFFICULTY-A-AH

## 2018-08-12 NOTE — Telephone Encounter (Signed)
Pt confirmed that she is doing okay. She is requesting refill on her albuterol inhaler. It does not look like we have ever prescribed this. She says it was previously prescribed by her lung doctor a long time ago. She uses it during the spring when pollen is up. She notices that she gets SOB on exertion while outside. She accidentally threw away her old one. She is not having any other symptoms. Advised that I would have to send note over to PCP since we have not prescribed. Pt voiced understanding

## 2018-08-12 NOTE — Telephone Encounter (Signed)
Left detailed message for patient.

## 2018-08-12 NOTE — Telephone Encounter (Signed)
See other note

## 2018-08-14 ENCOUNTER — Ambulatory Visit: Payer: Medicare Other | Admitting: Internal Medicine

## 2018-09-03 ENCOUNTER — Telehealth: Payer: Self-pay | Admitting: *Deleted

## 2018-09-03 NOTE — Telephone Encounter (Signed)
Patient called in and states a verbal visit is now okay with her due to her possibly needing medication refills.

## 2018-09-03 NOTE — Telephone Encounter (Signed)
I spoke with pt to reschedule her f/u appt from 08/14/18 that she cancelled. Pt does not want to reschedule a f/u appt. She only wants to come in for a CPE. I informed pt to call back & schedule once things are back to normal.

## 2018-09-03 NOTE — Telephone Encounter (Signed)
Pt has been scheduled for Thursday 4/23 @ 9am. Pt notified

## 2018-09-05 ENCOUNTER — Encounter: Payer: Self-pay | Admitting: Internal Medicine

## 2018-09-05 ENCOUNTER — Ambulatory Visit (INDEPENDENT_AMBULATORY_CARE_PROVIDER_SITE_OTHER): Payer: Medicare Other | Admitting: Internal Medicine

## 2018-09-05 ENCOUNTER — Other Ambulatory Visit: Payer: Self-pay

## 2018-09-05 DIAGNOSIS — R51 Headache: Secondary | ICD-10-CM | POA: Diagnosis not present

## 2018-09-05 DIAGNOSIS — Z1239 Encounter for other screening for malignant neoplasm of breast: Secondary | ICD-10-CM

## 2018-09-05 DIAGNOSIS — I10 Essential (primary) hypertension: Secondary | ICD-10-CM

## 2018-09-05 DIAGNOSIS — E2839 Other primary ovarian failure: Secondary | ICD-10-CM | POA: Diagnosis not present

## 2018-09-05 DIAGNOSIS — K219 Gastro-esophageal reflux disease without esophagitis: Secondary | ICD-10-CM

## 2018-09-05 DIAGNOSIS — D329 Benign neoplasm of meninges, unspecified: Secondary | ICD-10-CM | POA: Diagnosis not present

## 2018-09-05 DIAGNOSIS — R519 Headache, unspecified: Secondary | ICD-10-CM

## 2018-09-05 DIAGNOSIS — G479 Sleep disorder, unspecified: Secondary | ICD-10-CM

## 2018-09-05 DIAGNOSIS — R739 Hyperglycemia, unspecified: Secondary | ICD-10-CM | POA: Diagnosis not present

## 2018-09-05 DIAGNOSIS — E7800 Pure hypercholesterolemia, unspecified: Secondary | ICD-10-CM | POA: Diagnosis not present

## 2018-09-05 DIAGNOSIS — R1310 Dysphagia, unspecified: Secondary | ICD-10-CM

## 2018-09-05 NOTE — Progress Notes (Addendum)
[name], female   DOB: 02-18-1941, 78 y.o.   MRN: [id number] Virtual Visit via Telephone Note  This visit type was conducted due to national recommendations for restrictions regarding the COVID-19 pandemic (e.g. social distancing).  This format is felt to be most appropriate for this patient at this time.  All issues noted in this document were discussed and addressed.  No physical exam was performed (except for noted visual exam findings with Video Visits).   I connected with Tennis Must by telephone and verified that I am speaking with the correct person using two identifiers. Location patient: home Location provider: work Persons participating in the telephone visit: patient, provider  I discussed the limitations, risks, security and privacy concerns of performing an evaluation and management service by telephone and the availability of in person appointments.  The patient expressed understanding and agreed to proceed.   Reason for visit: follow up appt  HPI: States she is doing relatively well.  Trying to stay active.  Per last note, had a recent fall.  S/p shoulder fracture.  S/p surgery.  She is doing exercise at home now.  Shoulder is doing better.  Headache is better.  Not an issue for her now.  Trying to stay active.  No chest pain.  No sob.  Doing exercises in am.  Eating.  No acid reflux.  No abdominal pain.  Bowels moving.  No urine change.  Plans to start water aerobics once restrictions are lifted. Swallowing is better.  Has been taking liquid aloe and feels this hs helped.  Not an issue for her now.  Discussed mammogram and bone density.  Due.  She is agreeable to be scheduled.  Taking crestor.  Overall she feels she is doing relatively well.  Does report not sleeping as well.  Discussed melatonin.     ROS: See pertinent positives and negatives per HPI.  Past Medical History:  Diagnosis Date  . Fibrocystic breast disease   . Gastritis    EGD - schatzki's  ring, gastritis and gastric polyps  . History of colon polyps   . Hypercholesterolemia   . Hypertension   . MAI (mycobacterium avium-intracellulare) infection ([place])   . Osteoporosis   . Reflux esophagitis     Past Surgical History:  Procedure Laterality Date  . ABDOMINAL HYSTERECTOMY  1998  . BREAST EXCISIONAL BIOPSY Bilateral 1970's   benign  . BREAST EXCISIONAL BIOPSY Bilateral 2010   pt  stated dr Tamala Julian did bilat axilla lumps neg   . BREAST SURGERY     cyst(benign)  . COLONOSCOPY N/A 09/28/2014   Procedure: COLONOSCOPY;  Surgeon: Manya Silvas, MD;  Location: [facility] ENDOSCOPY;  Service: Endoscopy;  Laterality: N/A;  . ESOPHAGOGASTRODUODENOSCOPY N/A 09/28/2014   Procedure: ESOPHAGOGASTRODUODENOSCOPY (EGD);  Surgeon: Manya Silvas, MD;  Location: [facility] ENDOSCOPY;  Service: Endoscopy;  Laterality: N/A;  . ESOPHAGOGASTRODUODENOSCOPY (EGD) WITH PROPOFOL N/A 04/26/2016   Procedure: ESOPHAGOGASTRODUODENOSCOPY (EGD) WITH PROPOFOL;  Surgeon: Manya Silvas, MD;  Location: [facility] ENDOSCOPY;  Service: Endoscopy;  Laterality: N/A;  . excision of right axillary lipoma  10/07  . FOOT SURGERY Right   . HEMORRHOID SURGERY    . HERNIA REPAIR  10/07  . NASAL SEPTUM SURGERY      Family History  Problem Relation Age of Onset  . Heart disease Mother 19       heart attack  . Cancer Father        lung  . Stroke Brother   .  Diabetes Brother   . Diabetes Sister   . Breast cancer Sister 41    SOCIAL HX: reviewed.     Current Outpatient Medications:  .  albuterol (PROVENTIL HFA;VENTOLIN HFA) 108 (90 Base) MCG/ACT inhaler, Inhale 2 puffs into the lungs every 6 (six) hours as needed., Disp: 1 Inhaler, Rfl: 1 .  aspirin (ASPIRIN EC) 81 MG EC tablet, Take 81 mg by mouth daily. Swallow whole., Disp: , Rfl:  .  Fluticasone-Salmeterol (ADVAIR) 250-50 MCG/DOSE AEPB, Inhale 2 puffs into the lungs 2 (two) times daily., Disp: , Rfl:  .  metoprolol succinate (TOPROL-XL) 25 MG 24 hr tablet, Take 1  tablet (25 mg total) by mouth 2 (two) times daily., Disp: 180 tablet, Rfl: 1 .  Multiple Vitamin (MULTIVITAMIN WITH MINERALS) TABS, Take 1 tablet by mouth daily., Disp: , Rfl:  .  Omega-3 Fatty Acids (FISH OIL) 1000 MG CAPS, Take 3,000 mg by mouth daily., Disp: , Rfl:  .  omeprazole (PRILOSEC) 20 MG capsule, Take 1 capsule (20 mg total) by mouth 2 (two) times daily., Disp: 180 capsule, Rfl: 1 .  pantoprazole (PROTONIX) 40 MG tablet, Take 1 tablet (40 mg total) by mouth 2 (two) times daily before a meal., Disp: 180 tablet, Rfl: 1 .  rosuvastatin (CRESTOR) 10 MG tablet, TAKE 1 TABLET DAILY, Disp: 90 tablet, Rfl: 1  EXAM:  GENERAL: alert.  Sounds to be in no acute distress.  Answering questions appropriately.    PSYCH/NEURO: pleasant and cooperative, no obvious depression or anxiety, speech and thought processing grossly intact  ASSESSMENT AND PLAN:  Discussed the following assessment and plan:  Estrogen deficiency - Plan: DG Bone Density  Breast cancer screening  Dysphagia, unspecified type  Essential hypertension, benign  Gastroesophageal reflux disease, esophagitis presence not specified  Nonintractable headache, unspecified chronicity pattern, unspecified headache type  Hypercholesterolemia  Hyperglycemia  Meningioma (HCC)  Sleeping difficulty  Dysphagia Swallowing is better.  Doing well.  Follow.    Essential hypertension, benign Blood pressure has been under good control.  Continue current medication.  Follow pressures. Follow metabolic panel.    GERD (gastroesophageal reflux disease) No acid reflux reported.  Follow.  Controlled.    Headache Resolved.  Not an issue for her now.   Hypercholesterolemia On crestor.  Tolerating.  Low cholesterol diet and exercise.  Follow lipid panel and liver function tests.    Hyperglycemia Low carb diet and exercise.  Follow met b and a1c.    Meningioma [facility]) Has been followed by neurology.   Sleeping difficulty Discussed  melatonin.  Will try.  Will update me.      I discussed the assessment and treatment plan with the patient. The patient was provided an opportunity to ask questions and all were answered. The patient agreed with the plan and demonstrated an understanding of the instructions.   The patient was advised to call back or seek an in-person evaluation if the symptoms worsen or if the condition fails to improve as anticipated.  I provided 25 minutes of non-face-to-face time during this encounter.   Einar Pheasant, MD

## 2018-09-08 DIAGNOSIS — G479 Sleep disorder, unspecified: Secondary | ICD-10-CM | POA: Insufficient documentation

## 2018-09-08 NOTE — Assessment & Plan Note (Signed)
Resolved.  Not an issue for her now.

## 2018-09-08 NOTE — Assessment & Plan Note (Signed)
No acid reflux reported.  Follow.  Controlled.

## 2018-09-08 NOTE — Assessment & Plan Note (Signed)
Low carb diet and exercise.  Follow met b and a1c.   

## 2018-09-08 NOTE — Assessment & Plan Note (Signed)
Blood pressure has been under good control.  Continue current medication.  Follow pressures. Follow metabolic panel.

## 2018-09-08 NOTE — Assessment & Plan Note (Signed)
Discussed melatonin.  Will try.  Will update me.

## 2018-09-08 NOTE — Assessment & Plan Note (Signed)
Swallowing is better.  Doing well.  Follow.

## 2018-09-08 NOTE — Assessment & Plan Note (Signed)
On crestor.  Tolerating.  Low cholesterol diet and exercise.  Follow lipid panel and liver function tests.   

## 2018-09-08 NOTE — Assessment & Plan Note (Signed)
Has been followed by neurology.   

## 2018-09-17 DIAGNOSIS — S42202D Unspecified fracture of upper end of left humerus, subsequent encounter for fracture with routine healing: Secondary | ICD-10-CM | POA: Diagnosis not present

## 2018-09-17 DIAGNOSIS — S52502A Unspecified fracture of the lower end of left radius, initial encounter for closed fracture: Secondary | ICD-10-CM | POA: Diagnosis not present

## 2018-09-17 DIAGNOSIS — M25511 Pain in right shoulder: Secondary | ICD-10-CM | POA: Diagnosis not present

## 2018-09-17 DIAGNOSIS — M7541 Impingement syndrome of right shoulder: Secondary | ICD-10-CM | POA: Diagnosis not present

## 2018-09-18 ENCOUNTER — Telehealth: Payer: Self-pay

## 2018-09-18 NOTE — Telephone Encounter (Signed)
Letter printed for mammo/dexa date and time.

## 2018-10-14 ENCOUNTER — Other Ambulatory Visit: Payer: Self-pay | Admitting: Internal Medicine

## 2018-10-14 DIAGNOSIS — M25552 Pain in left hip: Secondary | ICD-10-CM

## 2018-11-04 ENCOUNTER — Telehealth: Payer: Self-pay

## 2018-11-04 NOTE — Telephone Encounter (Signed)
Ok.  Confirm doing ok and no other issues.

## 2018-11-04 NOTE — Telephone Encounter (Signed)
Ok to send her a [facility] lipid diet?

## 2018-11-04 NOTE — Telephone Encounter (Signed)
Copied from [place] [id number]. Topic: Quick Communication - See Telephone Encounter >> Nov 04, 2018 10:24 AM Loma Boston wrote: CRM for notification. See Telephone encounter for: 11/04/18. Pt called in requesting that Dr Nicki Reaper mail her a diet that would help her eat healthier with her cholesterol and her blood pressure issues.

## 2018-11-06 NOTE — Telephone Encounter (Signed)
Diet mailed to pt. Pt doing ok

## 2018-11-13 ENCOUNTER — Ambulatory Visit
Admission: RE | Admit: 2018-11-13 | Discharge: 2018-11-13 | Disposition: A | Discharge status: Home / Self Care | Payer: Medicare Other | Source: Ambulatory Visit | Attending: Internal Medicine | Admitting: Internal Medicine

## 2018-11-13 ENCOUNTER — Other Ambulatory Visit: Payer: Self-pay

## 2018-11-13 DIAGNOSIS — E2839 Other primary ovarian failure: Secondary | ICD-10-CM | POA: Diagnosis not present

## 2018-11-13 DIAGNOSIS — Z1231 Encounter for screening mammogram for malignant neoplasm of breast: Secondary | ICD-10-CM | POA: Insufficient documentation

## 2018-11-13 DIAGNOSIS — Z780 Asymptomatic menopausal state: Secondary | ICD-10-CM | POA: Diagnosis not present

## 2018-11-13 DIAGNOSIS — M810 Age-related osteoporosis without current pathological fracture: Secondary | ICD-10-CM | POA: Diagnosis not present

## 2018-11-20 ENCOUNTER — Other Ambulatory Visit: Payer: Self-pay | Admitting: Internal Medicine

## 2019-01-13 DIAGNOSIS — Z20828 Contact with and (suspected) exposure to other viral communicable diseases: Secondary | ICD-10-CM | POA: Diagnosis not present

## 2019-01-22 DIAGNOSIS — L2084 Intrinsic (allergic) eczema: Secondary | ICD-10-CM | POA: Diagnosis not present

## 2019-01-22 DIAGNOSIS — Z872 Personal history of diseases of the skin and subcutaneous tissue: Secondary | ICD-10-CM | POA: Diagnosis not present

## 2019-01-22 DIAGNOSIS — R208 Other disturbances of skin sensation: Secondary | ICD-10-CM | POA: Diagnosis not present

## 2019-01-22 DIAGNOSIS — H61032 Chondritis of left external ear: Secondary | ICD-10-CM | POA: Diagnosis not present

## 2019-01-22 DIAGNOSIS — Z85828 Personal history of other malignant neoplasm of skin: Secondary | ICD-10-CM | POA: Diagnosis not present

## 2019-01-22 DIAGNOSIS — L720 Epidermal cyst: Secondary | ICD-10-CM | POA: Diagnosis not present

## 2019-01-22 DIAGNOSIS — D485 Neoplasm of uncertain behavior of skin: Secondary | ICD-10-CM | POA: Diagnosis not present

## 2019-01-22 DIAGNOSIS — Z08 Encounter for follow-up examination after completed treatment for malignant neoplasm: Secondary | ICD-10-CM | POA: Diagnosis not present

## 2019-01-22 DIAGNOSIS — L578 Other skin changes due to chronic exposure to nonionizing radiation: Secondary | ICD-10-CM | POA: Diagnosis not present

## 2019-02-18 DIAGNOSIS — Z20828 Contact with and (suspected) exposure to other viral communicable diseases: Secondary | ICD-10-CM | POA: Diagnosis not present

## 2019-02-24 ENCOUNTER — Other Ambulatory Visit: Payer: Self-pay | Admitting: Internal Medicine

## 2019-02-24 MED ORDER — METOPROLOL SUCCINATE ER 25 MG PO TB24: 25.0000 mg | ORAL_TABLET | Freq: Two times a day (BID) | ORAL | 1 refills | Status: DC

## 2019-02-24 NOTE — Telephone Encounter (Signed)
Copied from [place] [id number]. Topic: Quick Communication - Rx Refill/Question >> Feb 24, 2019 10:20 AM Rainey Pines A wrote: Medication: metoprolol succinate (TOPROL-XL) 25 MG 24 hr tablet (Patient requesting 3 months supply)  Has the patient contacted their pharmacy? Yes (Agent: If no, request that the patient contact the pharmacy for the refill.) (Agent: If yes, when and what did the pharmacy advise?)Contact PCP  Preferred Pharmacy (with phone number or street name[facility] [place] [place] [facility] [employer] [facility] [phone] (Phone) [phone] (Fax)    Agent: Please be advised that RX refills may take up to 3 business days. We ask that you follow-up with your pharmacy.

## 2019-04-08 DIAGNOSIS — M542 Cervicalgia: Secondary | ICD-10-CM | POA: Diagnosis not present

## 2019-04-08 DIAGNOSIS — S42202D Unspecified fracture of upper end of left humerus, subsequent encounter for fracture with routine healing: Secondary | ICD-10-CM | POA: Diagnosis not present

## 2019-04-08 DIAGNOSIS — S52502A Unspecified fracture of the lower end of left radius, initial encounter for closed fracture: Secondary | ICD-10-CM | POA: Diagnosis not present

## 2019-04-17 DIAGNOSIS — M542 Cervicalgia: Secondary | ICD-10-CM | POA: Diagnosis not present

## 2019-04-23 DIAGNOSIS — M542 Cervicalgia: Secondary | ICD-10-CM | POA: Diagnosis not present

## 2019-05-15 ENCOUNTER — Other Ambulatory Visit: Payer: Self-pay | Admitting: Internal Medicine

## 2019-05-20 DIAGNOSIS — M542 Cervicalgia: Secondary | ICD-10-CM | POA: Diagnosis not present

## 2019-05-20 DIAGNOSIS — Z9889 Other specified postprocedural states: Secondary | ICD-10-CM | POA: Diagnosis not present

## 2019-08-27 ENCOUNTER — Telehealth: Payer: Self-pay | Admitting: Internal Medicine

## 2019-08-27 MED ORDER — PANTOPRAZOLE SODIUM 40 MG PO TBEC: 40.0000 mg | DELAYED_RELEASE_TABLET | Freq: Two times a day (BID) | ORAL | 1 refills | Status: DC

## 2019-08-27 NOTE — Telephone Encounter (Signed)
Pt needs a refill on pantoprazole (PROTONIX) 40 MG tablet 

## 2019-10-07 ENCOUNTER — Other Ambulatory Visit: Payer: Self-pay | Admitting: Internal Medicine

## 2019-11-07 ENCOUNTER — Other Ambulatory Visit: Payer: Self-pay | Admitting: Internal Medicine

## 2019-11-21 ENCOUNTER — Telehealth: Payer: Self-pay | Admitting: Internal Medicine

## 2019-11-21 MED ORDER — ALBUTEROL SULFATE HFA 108 (90 BASE) MCG/ACT IN AERS: 2.0000 | INHALATION_SPRAY | Freq: Four times a day (QID) | RESPIRATORY_TRACT | 0 refills | Status: DC | PRN

## 2019-11-21 NOTE — Telephone Encounter (Signed)
LMTCB

## 2019-11-21 NOTE — Telephone Encounter (Signed)
Pt [facility] [place]. She also wants to know if she could be prescribed montelukast (singulair) to take with inhaler? Her son said this works well for him.

## 2019-11-21 NOTE — Telephone Encounter (Signed)
I have not seen her since 08/2018.  (over one year).  She is calling in for inhaler.  Is she having issues with breathing, cough, congestion, etc.  If so, needs to be seen and needs appt scheduled with me.  Can talk about singulair at appt.

## 2019-11-21 NOTE — Telephone Encounter (Signed)
She stated she has been having SOB on exertion but she has had that for sometime. No other acute symptoms. She has appt scheduled with you in October. Will move appt up

## 2019-11-21 NOTE — Telephone Encounter (Signed)
Pt returned your call.  

## 2019-12-11 ENCOUNTER — Other Ambulatory Visit: Payer: Self-pay

## 2019-12-11 ENCOUNTER — Ambulatory Visit (INDEPENDENT_AMBULATORY_CARE_PROVIDER_SITE_OTHER): Payer: Medicare Other | Admitting: Internal Medicine

## 2019-12-11 ENCOUNTER — Ambulatory Visit (INDEPENDENT_AMBULATORY_CARE_PROVIDER_SITE_OTHER): Payer: Medicare Other

## 2019-12-11 DIAGNOSIS — R0602 Shortness of breath: Secondary | ICD-10-CM

## 2019-12-11 DIAGNOSIS — E7800 Pure hypercholesterolemia, unspecified: Secondary | ICD-10-CM

## 2019-12-11 DIAGNOSIS — R079 Chest pain, unspecified: Secondary | ICD-10-CM | POA: Diagnosis not present

## 2019-12-11 DIAGNOSIS — I10 Essential (primary) hypertension: Secondary | ICD-10-CM | POA: Diagnosis not present

## 2019-12-11 DIAGNOSIS — Z86010 Personal history of colonic polyps: Secondary | ICD-10-CM

## 2019-12-11 DIAGNOSIS — D329 Benign neoplasm of meninges, unspecified: Secondary | ICD-10-CM | POA: Diagnosis not present

## 2019-12-11 DIAGNOSIS — R739 Hyperglycemia, unspecified: Secondary | ICD-10-CM | POA: Diagnosis not present

## 2019-12-11 DIAGNOSIS — R351 Nocturia: Secondary | ICD-10-CM | POA: Diagnosis not present

## 2019-12-11 DIAGNOSIS — R1310 Dysphagia, unspecified: Secondary | ICD-10-CM

## 2019-12-11 DIAGNOSIS — G479 Sleep disorder, unspecified: Secondary | ICD-10-CM | POA: Diagnosis not present

## 2019-12-11 LAB — TSH: TSH: 1.68 u[IU]/mL (ref 0.35–4.50)

## 2019-12-11 LAB — LIPID PANEL
Cholesterol: 149 mg/dL (ref 0–200)
HDL: 79.3 mg/dL (ref >39.00)
LDL Cholesterol: 50 mg/dL (ref 0–99)
NonHDL: 69.34
Total CHOL/HDL Ratio: 2
Triglycerides: 98 mg/dL (ref 0.0–149.0)
VLDL: 19.6 mg/dL (ref 0.0–40.0)

## 2019-12-11 LAB — CBC WITH DIFFERENTIAL/PLATELET
Basophils Absolute: 0 10*3/uL (ref 0.0–0.1)
Basophils Relative: 1 % (ref 0.0–3.0)
Eosinophils Absolute: 0.3 10*3/uL (ref 0.0–0.7)
Eosinophils Relative: 5.5 % — ABNORMAL HIGH (ref 0.0–5.0)
HCT: 43.7 % (ref 36.0–46.0)
Hemoglobin: 14.8 g/dL (ref 12.0–15.0)
Lymphocytes Relative: 25.1 % (ref 12.0–46.0)
Lymphs Abs: 1.3 10*3/uL (ref 0.7–4.0)
MCHC: 33.8 g/dL (ref 30.0–36.0)
MCV: 96.7 fl (ref 78.0–100.0)
Monocytes Absolute: 0.5 10*3/uL (ref 0.1–1.0)
Monocytes Relative: 9.8 % (ref 3.0–12.0)
Neutro Abs: 3 10*3/uL (ref 1.4–7.7)
Neutrophils Relative %: 58.6 % (ref 43.0–77.0)
Platelets: 231 10*3/uL (ref 150.0–400.0)
RBC: 4.52 Mil/uL (ref 3.87–5.11)
RDW: 12.7 % (ref 11.5–15.5)
WBC: 5.2 10*3/uL (ref 4.0–10.5)

## 2019-12-11 LAB — URINALYSIS, ROUTINE W REFLEX MICROSCOPIC
Bilirubin Urine: NEGATIVE
Hgb urine dipstick: NEGATIVE
Ketones, ur: NEGATIVE
Leukocytes,Ua: NEGATIVE
Nitrite: NEGATIVE
RBC / HPF: NONE SEEN (ref 0–?)
Specific Gravity, Urine: 1.025 (ref 1.000–1.030)
Total Protein, Urine: NEGATIVE
Urine Glucose: NEGATIVE
Urobilinogen, UA: 0.2 (ref 0.0–1.0)
pH: 6 (ref 5.0–8.0)

## 2019-12-11 LAB — HEPATIC FUNCTION PANEL
ALT: 31 U/L (ref 0–35)
AST: 24 U/L (ref 0–37)
Albumin: 4.4 g/dL (ref 3.5–5.2)
Alkaline Phosphatase: 105 U/L (ref 39–117)
Bilirubin, Direct: 0.1 mg/dL (ref 0.0–0.3)
Total Bilirubin: 0.5 mg/dL (ref 0.2–1.2)
Total Protein: 7.4 g/dL (ref 6.0–8.3)

## 2019-12-11 LAB — BASIC METABOLIC PANEL
BUN: 18 mg/dL (ref 6–23)
CO2: 32 mEq/L (ref 19–32)
Calcium: 9.5 mg/dL (ref 8.4–10.5)
Chloride: 101 mEq/L (ref 96–112)
Creatinine, Ser: 0.78 mg/dL (ref 0.40–1.20)
GFR: 71.44 mL/min (ref >60.00)
Glucose, Bld: 108 mg/dL — ABNORMAL HIGH (ref 70–99)
Potassium: 4.9 mEq/L (ref 3.5–5.1)
Sodium: 139 mEq/L (ref 135–145)

## 2019-12-11 LAB — HEMOGLOBIN A1C: Hgb A1c MFr Bld: 6.1 % (ref 4.6–6.5)

## 2019-12-11 NOTE — Progress Notes (Signed)
[name], female   DOB: 06-06-1940, 79 y.o.   MRN: [id number]   Subjective:    [name], female    DOB: [birth year], 79 y.o.   MRN: [id number]  HPI This visit occurred during the SARS-CoV-2 public health emergency.  Safety protocols were in place, including screening questions prior to the visit, additional usage of staff PPE, and extensive cleaning of exam room while observing appropriate contact time as indicated for disinfecting solutions.  Patient here for a scheduled follow up.  Last seen 09/05/18.  Increased stress with family issues.  Discussed with her today.  Previously fell and broke her shoulder.  Discussed ortho follow up.  Reports has been told has a "twisted esophagus".  In reviewing GI records - had previous barium swallow.  Revealed spasm of lower esophagus.  No fixed stricture.  No obstruction.  Moderate size hiatal hernia.  Taking PPI.  States if bends over - hurts her stomach/chest.  States food will "stick" at times.  Sometimes regurgitates food.  Some phlegm comes up at this time.  Weight is up.  No other abdominal pain.  Bowels moving. Some nocturia.  Some bladder leakage.  Mouth dry.  Increased fatigue.  Does reports some intermittent left breast/chest pain.  Some sob with walking.  Previously saw Dr Risa Grill.  Diagnosed with asthma.  Has inhaler if needed.  Request rx for singulair.  Son takes this medication and she has tried a few.  Feels helps her "feel better".  Also has seen cardiology previously, but has been a couple of years (or more).  Having increased sob with exertion.  Discussed possible sleep apnea with sleep issues, fatigue and increased nocturia.    Past Medical History:  Diagnosis Date  . Fibrocystic breast disease   . Gastritis    EGD - schatzki's ring, gastritis and gastric polyps  . History of colon polyps   . Hypercholesterolemia   . Hypertension   . MAI (mycobacterium avium-intracellulare) infection ([place])   . Osteoporosis    . Reflux esophagitis    Past Surgical History:  Procedure Laterality Date  . ABDOMINAL HYSTERECTOMY  1998  . BREAST EXCISIONAL BIOPSY Bilateral 1970's   benign  . BREAST EXCISIONAL BIOPSY Bilateral 2010   pt  stated dr Tamala Julian did bilat axilla lumps neg   . BREAST SURGERY     cyst(benign)  . COLONOSCOPY N/A 09/28/2014   Procedure: COLONOSCOPY;  Surgeon: Manya Silvas, MD;  Location: [facility] ENDOSCOPY;  Service: Endoscopy;  Laterality: N/A;  . ESOPHAGOGASTRODUODENOSCOPY N/A 09/28/2014   Procedure: ESOPHAGOGASTRODUODENOSCOPY (EGD);  Surgeon: Manya Silvas, MD;  Location: [facility] ENDOSCOPY;  Service: Endoscopy;  Laterality: N/A;  . ESOPHAGOGASTRODUODENOSCOPY (EGD) WITH PROPOFOL N/A 04/26/2016   Procedure: ESOPHAGOGASTRODUODENOSCOPY (EGD) WITH PROPOFOL;  Surgeon: Manya Silvas, MD;  Location: [facility] ENDOSCOPY;  Service: Endoscopy;  Laterality: N/A;  . excision of right axillary lipoma  10/07  . FOOT SURGERY Right   . HEMORRHOID SURGERY    . HERNIA REPAIR  10/07  . NASAL SEPTUM SURGERY     Family History  Problem Relation Age of Onset  . Heart disease Mother 61       heart attack  . Cancer Father        lung  . Stroke Brother   . Diabetes Brother   . Diabetes Sister   . Breast cancer Sister 41   Social History   Socioeconomic History  . Marital status: Widowed    Spouse name:  Not on file  . Number of children: Not on file  . Years of education: Not on file  . Highest education level: Not on file  Occupational History  . Not on file  Tobacco Use  . Smoking status: Never Smoker  . Smokeless tobacco: Never Used  Substance and Sexual Activity  . Alcohol use: No    Alcohol/week: 0.0 standard drinks  . Drug use: No  . Sexual activity: Not on file  Other Topics Concern  . Not on file  Social History Narrative  . Not on file   Social Determinants of Health   Financial Resource Strain:   . Difficulty of Paying Living Expenses:   Food Insecurity:   . Worried About  Charity fundraiser in the Last Year:   . Arboriculturist in the Last Year:   Transportation Needs:   . Film/video editor (Medical):   Marland Kitchen Lack of Transportation (Non-Medical):   Physical Activity:   . Days of Exercise per Week:   . Minutes of Exercise per Session:   Stress:   . Feeling of Stress :   Social Connections:   . Frequency of Communication with Friends and Family:   . Frequency of Social Gatherings with Friends and Family:   . Attends Religious Services:   . Active Member of Clubs or Organizations:   . Attends Archivist Meetings:   Marland Kitchen Marital Status:     Outpatient Encounter Medications as of 12/11/2019  Medication Sig  . albuterol (VENTOLIN HFA) 108 (90 Base) MCG/ACT inhaler Inhale 2 puffs into the lungs every 6 (six) hours as needed.  Marland Kitchen aspirin (ASPIRIN EC) 81 MG EC tablet Take 81 mg by mouth daily. Swallow whole.  Marland Kitchen Fluticasone-Salmeterol (ADVAIR) 250-50 MCG/DOSE AEPB Inhale 2 puffs into the lungs 2 (two) times daily.  . metoprolol succinate (TOPROL-XL) 25 MG 24 hr tablet Take 1 tablet (25 mg total) by mouth 2 (two) times daily.  . Multiple Vitamin (MULTIVITAMIN WITH MINERALS) TABS Take 1 tablet by mouth daily.  . Omega-3 Fatty Acids (FISH OIL) 1000 MG CAPS Take 3,000 mg by mouth daily.  Marland Kitchen omeprazole (PRILOSEC) 20 MG capsule Take 1 capsule (20 mg total) by mouth 2 (two) times daily.  . pantoprazole (PROTONIX) 40 MG tablet Take 1 tablet (40 mg total) by mouth 2 (two) times daily before a meal.  . rosuvastatin (CRESTOR) 10 MG tablet TAKE 1 TABLET DAILY   No facility-administered encounter medications on file as of 12/11/2019.    Review of Systems  Constitutional: Positive for fatigue. Negative for appetite change.       Weight has increased.    HENT: Negative for congestion and sinus pressure.   Respiratory: Positive for shortness of breath. Negative for cough and chest tightness.   Cardiovascular: Positive for chest pain. Negative for palpitations and  leg swelling.  Gastrointestinal: Negative for abdominal pain, diarrhea, nausea and vomiting.  Genitourinary: Negative for difficulty urinating and dysuria.       Nocturia.   Musculoskeletal: Negative for myalgias and neck pain.  Skin: Negative for color change and rash.  Neurological: Negative for dizziness, light-headedness and headaches.  Psychiatric/Behavioral: Negative for agitation and dysphoric mood.       Increased stress as outlined.         Objective:    Physical Exam Vitals reviewed.  Constitutional:      General: She is not in acute distress.    Appearance: Normal appearance.  HENT:  Head: Normocephalic and atraumatic.     Right Ear: External ear normal.     Left Ear: External ear normal.  Eyes:     General: No scleral icterus.       Right eye: No discharge.        Left eye: No discharge.     Conjunctiva/sclera: Conjunctivae normal.  Neck:     Thyroid: No thyromegaly.  Cardiovascular:     Rate and Rhythm: Normal rate and regular rhythm.  Pulmonary:     Effort: No respiratory distress.     Breath sounds: Normal breath sounds. No wheezing.  Abdominal:     General: Bowel sounds are normal.     Palpations: Abdomen is soft.     Tenderness: There is no abdominal tenderness.  Musculoskeletal:        General: No swelling or tenderness.     Cervical back: Neck supple. No tenderness.  Lymphadenopathy:     Cervical: No cervical adenopathy.  Skin:    Findings: No erythema or rash.  Neurological:     Mental Status: She is alert.  Psychiatric:        Mood and Affect: Mood normal.        Behavior: Behavior normal.     BP (!) 130/78   Pulse 89   Temp 98.2 F (36.8 C)   Resp 16   Ht _0  (1.626 m)   Wt 190 lb (86.2 kg)   SpO2 98%   BMI 32.61 kg/m  Wt Readings from Last 3 Encounters:  12/11/19 190 lb (86.2 kg)  05/06/18 182 lb (82.6 kg)  03/20/18 178 lb (80.7 kg)     Lab Results  Component Value Date   WBC 5.2 12/11/2019   HGB 14.8 12/11/2019    HCT 43.7 12/11/2019   PLT 231.0 12/11/2019   GLUCOSE 108 (H) 12/11/2019   CHOL 149 12/11/2019   TRIG 98.0 12/11/2019   HDL 79.30 12/11/2019   LDLCALC 50 12/11/2019   ALT 31 12/11/2019   AST 24 12/11/2019   NA 139 12/11/2019   K 4.9 12/11/2019   CL 101 12/11/2019   CREATININE 0.78 12/11/2019   BUN 18 12/11/2019   CO2 32 12/11/2019   TSH 1.68 12/11/2019   HGBA1C 6.1 12/11/2019    DG Bone Density  Result Date: 11/13/2018 EXAM: DUAL X-RAY ABSORPTIOMETRY (DXA) FOR BONE MINERAL DENSITY IMPRESSION: Technologist:VLM Your patient Noretta Frier completed a BMD test on 11/13/2018 using the [place] (analysis version: 14.10) manufactured by [employer]. The following summarizes the results of our evaluation. PATIENT BIOGRAPHICAL: Name: Meriam, Chojnowski Patient ID: [id number] Birth Date: [birth year] Height: 63.0 in. Gender: Female Exam Date: 11/13/2018 Weight: 184.0 lbs. Indications: Advanced Age, Asthma, Caucasian, Hysterectomy, Postmenopausal Fractures: Left shoulder, Left wrist Treatments: Albuterol, Multi-Vitamin ASSESSMENT: The BMD measured at Femur Total Left is 0.682 g/cm2 with a T-score of -2.6. This patient is considered osteoporotic according to [facility]) criteria. Lumbar spine was not utilized due to advanced degenerative changes. The scan quality is good. Site Region Measured Measured WHO Young Adult BMD Date       Age      Classification T-score DualFemur Total Left 11/13/2018 76.8 Osteoporosis -2.6 0.682 g/cm2 Right Forearm Radius 33% 11/13/2018 76.8 Normal -0.9 0.794 g/cm2 World Health Organization [facility]) criteria for post-menopausal, Caucasian Women: Normal:       T-score at or above -1 SD Osteopenia:   T-score between -1 and -2.5 SD Osteoporosis: T-score at or below -  2.5 SD RECOMMENDATIONS: 1. All patients should optimize calcium and vitamin D intake. 2. Consider FDA-approved medical therapies in postmenopausal women and men aged 31 years and older, based on  the following: a. A hip or vertebral(clinical or morphometric) fracture b. T-score < -2.5 at the femoral neck or spine after appropriate evaluation to exclude secondary causes c. Low bone mass (T-score between -1.0 and -2.5 at the femoral neck or spine) and a 10-year probability of a hip fracture > 3% or a 10-year probability of a major osteoporosis-related fracture > 20% based on the [place]-adapted WHO algorithm d. Clinician judgment and/or patient preferences may indicate treatment for people with 10-year fracture probabilities above or below these levels FOLLOW-UP: People with diagnosed cases of osteoporosis or at high risk for fracture should have regular bone mineral density tests. For patients eligible for Medicare, routine testing is allowed once every 2 years. The testing frequency can be increased to one year for patients who have rapidly progressing disease, those who are receiving or discontinuing medical therapy to restore bone mass, or have additional risk factors. Electronically Signed   By: Marlaine Hind M.D.   On: 11/13/2018 13:45   MM 3D SCREEN BREAST BILATERAL  Result Date: 11/13/2018 CLINICAL DATA:  Screening. EXAM: DIGITAL SCREENING BILATERAL MAMMOGRAM WITH TOMO AND CAD COMPARISON:  Previous exam(s). ACR Breast Density Category b: There are scattered areas of fibroglandular density. FINDINGS: There are no findings suspicious for malignancy. Images were processed with CAD. IMPRESSION: No mammographic evidence of malignancy. A result letter of this screening mammogram will be mailed directly to the patient. RECOMMENDATION: Screening mammogram in one year. (Code:SM-B-01Y) BI-RADS CATEGORY  1: Negative. Electronically Signed   By: Margarette Canada M.D.   On: 11/13/2018 15:47       Assessment & Plan:   Problem List Items Addressed This Visit    Chest pain    Describes two different types of pain.  Notices chest pain with bending forward.  Also notices pain with eating.  Discussed small bites and  chewing food well.  Some dysphagia as outlined.  Has also noticed some sob with exertion.  Occasionally will notice some left side chest pain.  No pain now.  EKG - SR with no acute ischemic changes.  Needs f/u with cardiology for further cardiac w/up and evaluation.  Request to see Dr End.        Relevant Orders   EKG 12-Lead (Completed)   Ambulatory referral to Cardiology   Dysphagia    Swallowing issues as outlined.  Some regurgitation.  States taking omeprazole bid.  Needs f/u with GI.  Discussed eating slow, taking small bites and chewing food well.        Essential hypertension, benign    Blood pressure as outlined.  Continue metoprolol.  Follow pressures.  Follow metabolic panel.       Relevant Orders   CBC with Differential/Platelet (Completed)   TSH (Completed)   Basic metabolic panel (Completed)   History of colonic polyps    Per review, colonoscopy 09/2014.  Recommended f/u in 5 years.  Need to sort thought breathing (pulmonary and cardiac issues) prior to f/u colonoscopy.        Hypercholesterolemia    On crestor.  Low cholesterol diet and exercise.  Follow lipid panel and liver function tests.        Relevant Orders   Hepatic function panel (Completed)   Lipid panel (Completed)   Hyperglycemia    Low carb diet and exercise.  Follow met b and a1c.       Relevant Orders   Hemoglobin A1c (Completed)   Meningioma (HCC)    Has been previously followed by neurology.  Need to confirm has f/u.       Nocturia    Will check urine.  Also check labs to confirm normal blood sugar.  Pulmonary referral for possible sleep apnea.       Relevant Orders   Urinalysis, Routine w reflex microscopic (Completed)   Sleeping difficulty    Feel multifactorial.  With increased fatigue, not feeling rested, nocturia - concern regarding sleep apnea.  Will have pulmonary evaluate.        SOB (shortness of breath)    Has noticed increased sob with exertion.  Has gained weight.  Issues  with dysphagia with history of hiatal hernia as outlined.  Has seen pulmonary.  States was diagnosed with asthma.  Check cxr.  No increased cough or congestion.  EKG as outlined.  Refer to cardiology for further cardiac evaluation.        Relevant Orders   DG Chest 2 View (Completed)   Ambulatory referral to Cardiology   Ambulatory referral to Pulmonology      I spent 45 minutes with the patient and more than 50% of the time was spent in consultation regarding the above.  Time spent discussing her current concerns and symptoms.  Time also spent discussing plans for further treatment, work up and evaluation.     Einar Pheasant, MD

## 2019-12-14 ENCOUNTER — Encounter: Payer: Self-pay | Admitting: Internal Medicine

## 2019-12-14 DIAGNOSIS — R0602 Shortness of breath: Secondary | ICD-10-CM | POA: Insufficient documentation

## 2019-12-14 DIAGNOSIS — R079 Chest pain, unspecified: Secondary | ICD-10-CM | POA: Insufficient documentation

## 2019-12-14 NOTE — Assessment & Plan Note (Signed)
On crestor.  Low cholesterol diet and exercise.  Follow lipid panel and liver function tests.   

## 2019-12-14 NOTE — Assessment & Plan Note (Signed)
Will check urine.  Also check labs to confirm normal blood sugar.  Pulmonary referral for possible sleep apnea.

## 2019-12-14 NOTE — Assessment & Plan Note (Signed)
Low carb diet and exercise.  Follow met b and a1c.  

## 2019-12-14 NOTE — Assessment & Plan Note (Signed)
Per review, colonoscopy 09/2014.  Recommended f/u in 5 years.  Need to sort thought breathing (pulmonary and cardiac issues) prior to f/u colonoscopy.

## 2019-12-14 NOTE — Assessment & Plan Note (Signed)
Swallowing issues as outlined.  Some regurgitation.  States taking omeprazole bid.  Needs f/u with GI.  Discussed eating slow, taking small bites and chewing food well.

## 2019-12-14 NOTE — Assessment & Plan Note (Signed)
Has noticed increased sob with exertion.  Has gained weight.  Issues with dysphagia with history of hiatal hernia as outlined.  Has seen pulmonary.  States was diagnosed with asthma.  Check cxr.  No increased cough or congestion.  EKG as outlined.  Refer to cardiology for further cardiac evaluation.

## 2019-12-14 NOTE — Assessment & Plan Note (Signed)
Has been previously followed by neurology.  Need to confirm has f/u.

## 2019-12-14 NOTE — Assessment & Plan Note (Signed)
Blood pressure as outlined.  Continue metoprolol.  Follow pressures.  Follow metabolic panel.  

## 2019-12-14 NOTE — Assessment & Plan Note (Signed)
Describes two different types of pain.  Notices chest pain with bending forward.  Also notices pain with eating.  Discussed small bites and chewing food well.  Some dysphagia as outlined.  Has also noticed some sob with exertion.  Occasionally will notice some left side chest pain.  No pain now.  EKG - SR with no acute ischemic changes.  Needs f/u with cardiology for further cardiac w/up and evaluation.  Request to see Dr End.

## 2019-12-14 NOTE — Assessment & Plan Note (Signed)
Feel multifactorial.  With increased fatigue, not feeling rested, nocturia - concern regarding sleep apnea.  Will have pulmonary evaluate.

## 2019-12-31 ENCOUNTER — Telehealth: Payer: Self-pay | Admitting: Internal Medicine

## 2019-12-31 NOTE — Telephone Encounter (Signed)
Patient was in to see Dr. Nicki Reaper in July. Patient states that Dr. Nicki Reaper was going to call in a medication for her breathing.

## 2020-01-01 NOTE — Telephone Encounter (Signed)
She had requested a rx for singulair.  (has never been prescribed for her).  I had discussed cardiology evaluation first - to determine the etiology of her sob and pending their assessment a f/u with pulmonary.  She has seen pulmonary previously.

## 2020-01-01 NOTE — Telephone Encounter (Signed)
Patient aware scheduled with cardiology and pulmonary of September. They said this was the earliest they could see her. She is wanting you to prescribe and monitor until she sees them.

## 2020-01-02 ENCOUNTER — Other Ambulatory Visit: Payer: Self-pay | Admitting: Internal Medicine

## 2020-01-02 MED ORDER — MONTELUKAST SODIUM 10 MG PO TABS: 10.0000 mg | ORAL_TABLET | Freq: Every day | ORAL | 1 refills | Status: DC

## 2020-01-02 NOTE — Telephone Encounter (Signed)
Left detailed message for patient.

## 2020-01-02 NOTE — Telephone Encounter (Signed)
Reviewed chart.  Given documented history of asthma - trial of singulair 10mg  q day.  I will send in a prescription.  Let us know if any problems.

## 2020-01-02 NOTE — Progress Notes (Signed)
rx sent in for singulair #30 with one refill.

## 2020-01-21 DIAGNOSIS — Z872 Personal history of diseases of the skin and subcutaneous tissue: Secondary | ICD-10-CM | POA: Diagnosis not present

## 2020-01-21 DIAGNOSIS — H61032 Chondritis of left external ear: Secondary | ICD-10-CM | POA: Diagnosis not present

## 2020-01-21 DIAGNOSIS — L821 Other seborrheic keratosis: Secondary | ICD-10-CM | POA: Diagnosis not present

## 2020-01-21 DIAGNOSIS — L570 Actinic keratosis: Secondary | ICD-10-CM | POA: Diagnosis not present

## 2020-01-21 DIAGNOSIS — R233 Spontaneous ecchymoses: Secondary | ICD-10-CM | POA: Diagnosis not present

## 2020-01-21 DIAGNOSIS — L578 Other skin changes due to chronic exposure to nonionizing radiation: Secondary | ICD-10-CM | POA: Diagnosis not present

## 2020-01-21 DIAGNOSIS — L720 Epidermal cyst: Secondary | ICD-10-CM | POA: Diagnosis not present

## 2020-01-21 DIAGNOSIS — D485 Neoplasm of uncertain behavior of skin: Secondary | ICD-10-CM | POA: Diagnosis not present

## 2020-01-21 DIAGNOSIS — Z7189 Other specified counseling: Secondary | ICD-10-CM | POA: Diagnosis not present

## 2020-01-21 DIAGNOSIS — Z85828 Personal history of other malignant neoplasm of skin: Secondary | ICD-10-CM | POA: Diagnosis not present

## 2020-01-21 DIAGNOSIS — Z08 Encounter for follow-up examination after completed treatment for malignant neoplasm: Secondary | ICD-10-CM | POA: Diagnosis not present

## 2020-01-21 DIAGNOSIS — X32XXXA Exposure to sunlight, initial encounter: Secondary | ICD-10-CM | POA: Diagnosis not present

## 2020-01-24 ENCOUNTER — Other Ambulatory Visit: Payer: Self-pay | Admitting: Internal Medicine

## 2020-02-02 ENCOUNTER — Ambulatory Visit (INDEPENDENT_AMBULATORY_CARE_PROVIDER_SITE_OTHER): Payer: Medicare Other | Admitting: Cardiology

## 2020-02-02 ENCOUNTER — Encounter: Payer: Self-pay | Admitting: Pulmonary Disease

## 2020-02-02 ENCOUNTER — Encounter: Payer: Self-pay | Admitting: Cardiology

## 2020-02-02 ENCOUNTER — Other Ambulatory Visit: Payer: Self-pay

## 2020-02-02 ENCOUNTER — Ambulatory Visit (INDEPENDENT_AMBULATORY_CARE_PROVIDER_SITE_OTHER): Payer: Medicare Other | Admitting: Pulmonary Disease

## 2020-02-02 VITALS — BP 132/78 | HR 93 | Temp 97.8°F | Ht 64.0 in | Wt 192.6 lb

## 2020-02-02 VITALS — BP 120/80 | HR 79 | Ht 64.0 in | Wt 192.4 lb

## 2020-02-02 DIAGNOSIS — R0600 Dyspnea, unspecified: Secondary | ICD-10-CM

## 2020-02-02 DIAGNOSIS — R072 Precordial pain: Secondary | ICD-10-CM | POA: Diagnosis not present

## 2020-02-02 DIAGNOSIS — I10 Essential (primary) hypertension: Secondary | ICD-10-CM | POA: Diagnosis not present

## 2020-02-02 DIAGNOSIS — E7800 Pure hypercholesterolemia, unspecified: Secondary | ICD-10-CM

## 2020-02-02 DIAGNOSIS — J4540 Moderate persistent asthma, uncomplicated: Secondary | ICD-10-CM

## 2020-02-02 DIAGNOSIS — R079 Chest pain, unspecified: Secondary | ICD-10-CM

## 2020-02-02 DIAGNOSIS — R0602 Shortness of breath: Secondary | ICD-10-CM

## 2020-02-02 DIAGNOSIS — R0609 Other forms of dyspnea: Secondary | ICD-10-CM

## 2020-02-02 MED ORDER — TRELEGY ELLIPTA 100-62.5-25 MCG/INH IN AEPB: 1.0000 | INHALATION_SPRAY | Freq: Every day | RESPIRATORY_TRACT | 0 refills | Status: AC

## 2020-02-02 MED ORDER — TRELEGY ELLIPTA 100-62.5-25 MCG/INH IN AEPB
1.0000 | INHALATION_SPRAY | Freq: Every day | RESPIRATORY_TRACT | 3 refills | Status: DC
Start: 2020-02-02 — End: 2020-12-16

## 2020-02-02 NOTE — Progress Notes (Signed)
Cardiology Office Note:    Date:  02/02/2020   ID:  Angela Cohen, DOB [birth year], MRN [id number]  PCP:  Einar Pheasant, MD  [facility] Cardiologist:  Kate Sable, MD  [facility] Electrophysiologist:  None   Referring MD: Einar Pheasant, MD   Chief Complaint  Patient presents with  . New Patient (Initial Visit)    Referred by Dr. Nicki Reaper for evaluation of chest pain and DOE; Meds verbally reviewed with patient.    History of Present Illness:    Angela Cohen is a 79 y.o. female with a hx of hypertension, hyperlipidemia who presents due to chest pain and dyspnea on exertion.  Patient states having worsening dyspnea on exertion over the past 6 months.  Symptoms are sometimes associated with chest discomfort.  Symptoms are resolved with resting.  She states having left-sided chest pain which lasts a couple of minutes when she exerts herself.  She thinks symptoms are worsening.  Denies dizziness, palpitations.  She is worried because her mother passed away from heart attack in her 64s.  Her sister had CABG in her 79s.  Patient was previously seen for cardiology/cardiac care by [facility] cardiology in 2017.  She was diagnosed with Schatzki's ring being evaluated for possible esophageal dilatation.  Due to symptoms of chest pain, echo and Myoview was ordered.  Myoview on 04/2016 showed no evidence for ischemia.  Echo on 04/2016 showed normal systolic function, EF 94%, mild MR, mild TR   Past Medical History:  Diagnosis Date  . Fibrocystic breast disease   . Gastritis    EGD - schatzki's ring, gastritis and gastric polyps  . History of colon polyps   . Hypercholesterolemia   . Hypertension   . MAI (mycobacterium avium-intracellulare) infection ([place])   . Osteoporosis   . Reflux esophagitis     Past Surgical History:  Procedure Laterality Date  . ABDOMINAL HYSTERECTOMY  1998  . BREAST EXCISIONAL BIOPSY Bilateral 1970's   benign  . BREAST EXCISIONAL BIOPSY Bilateral 2010    pt  stated dr Tamala Julian did bilat axilla lumps neg   . BREAST SURGERY     cyst(benign)  . COLONOSCOPY N/A 09/28/2014   Procedure: COLONOSCOPY;  Surgeon: Manya Silvas, MD;  Location[facility] ENDOSCOPY;  Service: Endoscopy;  Laterality: N/A;  . ESOPHAGOGASTRODUODENOSCOPY N/A 09/28/2014   Procedure: ESOPHAGOGASTRODUODENOSCOPY (EGD);  Surgeon: Manya Silvas, MD;  Location: [facility] ENDOSCOPY;  Service: Endoscopy;  Laterality: N/A;  . ESOPHAGOGASTRODUODENOSCOPY (EGD) WITH PROPOFOL N/A 04/26/2016   Procedure: ESOPHAGOGASTRODUODENOSCOPY (EGD) WITH PROPOFOL;  Surgeon: Manya Silvas, MD;  Location[facility] ENDOSCOPY;  Service: Endoscopy;  Laterality: N/A;  . excision of right axillary lipoma  10/07  . FOOT SURGERY Right   . HEMORRHOID SURGERY    . HERNIA REPAIR  10/07  . NASAL SEPTUM SURGERY      Current Medications: Current Meds  Medication Sig  . albuterol (VENTOLIN HFA) 108 (90 Base) MCG/ACT inhaler Inhale 2 puffs into the lungs every 6 (six) hours as needed.  Marland Kitchen aspirin (ASPIRIN EC) 81 MG EC tablet Take 81 mg by mouth daily. Swallow whole.  Marland Kitchen Fluticasone-Umeclidin-Vilant (TRELEGY ELLIPTA) 100-62.5-25 MCG/INH AEPB Inhale 1 puff into the lungs daily.  . Fluticasone-Umeclidin-Vilant (TRELEGY ELLIPTA) 100-62.5-25 MCG/INH AEPB Inhale 1 puff into the lungs daily for 1 day.  . metoprolol succinate (TOPROL-XL) 25 MG 24 hr tablet Take 1 tablet (25 mg total) by mouth 2 (two) times daily.  . montelukast (SINGULAIR) 10 MG tablet TAKE 1 TABLET BY MOUTH  EVERYDAY AT BEDTIME  . Multiple Vitamin (MULTIVITAMIN WITH MINERALS) TABS Take 1 tablet by mouth daily.  . Omega-3 Fatty Acids (FISH OIL) 1000 MG CAPS Take 3,000 mg by mouth daily.  Marland Kitchen omeprazole (PRILOSEC) 20 MG capsule Take 1 capsule (20 mg total) by mouth 2 (two) times daily.  . pantoprazole (PROTONIX) 40 MG tablet Take 1 tablet (40 mg total) by mouth 2 (two) times daily before a meal.  . rosuvastatin (CRESTOR) 10 MG tablet TAKE 1 TABLET DAILY     Allergies:    Patient has no known allergies.   Social History   Socioeconomic History  . Marital status: Widowed    Spouse name: Not on file  . Number of children: Not on file  . Years of education: Not on file  . Highest education level: Not on file  Occupational History  . Not on file  Tobacco Use  . Smoking status: Never Smoker  . Smokeless tobacco: Never Used  Vaping Use  . Vaping Use: Never used  Substance and Sexual Activity  . Alcohol use: No    Alcohol/week: 0.0 standard drinks  . Drug use: No  . Sexual activity: Not on file  Other Topics Concern  . Not on file  Social History Narrative  . Not on file   Social Determinants of Health   Financial Resource Strain:   . Difficulty of Paying Living Expenses: Not on file  Food Insecurity:   . Worried About Charity fundraiser in the Last Year: Not on file  . Ran Out of Food in the Last Year: Not on file  Transportation Needs:   . Lack of Transportation (Medical): Not on file  . Lack of Transportation (Non-Medical): Not on file  Physical Activity:   . Days of Exercise per Week: Not on file  . Minutes of Exercise per Session: Not on file  Stress:   . Feeling of Stress : Not on file  Social Connections:   . Frequency of Communication with Friends and Family: Not on file  . Frequency of Social Gatherings with Friends and Family: Not on file  . Attends Religious Services: Not on file  . Active Member of Clubs or Organizations: Not on file  . Attends Archivist Meetings: Not on file  . Marital Status: Not on file     Family History: The patient's family history includes Breast cancer (age of onset: 84) in her sister; Cancer in her father; Diabetes in her brother and sister; Heart disease (age of onset: 68) in her mother; Stroke in her brother.  ROS:   Please see the history of present illness.     All other systems reviewed and are negative.  EKGs/Labs/Other Studies Reviewed:    The following studies were reviewed  today:   EKG:  EKG is  ordered today.  The ekg ordered today demonstrates normal sinus rhythm, normal ECG.  Recent Labs: 12/11/2019: ALT 31; BUN 18; Creatinine, Ser 0.78; Hemoglobin 14.8; Platelets 231.0; Potassium 4.9; Sodium 139; TSH 1.68  Recent Lipid Panel    Component Value Date/Time   CHOL 149 12/11/2019 1122   TRIG 98.0 12/11/2019 1122   HDL 79.30 12/11/2019 1122   CHOLHDL 2 12/11/2019 1122   VLDL 19.6 12/11/2019 1122   LDLCALC 50 12/11/2019 1122    Physical Exam:    VS:  BP 120/80 (BP Location: Right Arm, Patient Position: Sitting, Cuff Size: Large)   Pulse 79   Ht 5\' 4"  (1.626 m)  Wt 192 lb 6 oz (87.3 kg)   SpO2 95%   BMI 33.02 kg/m     Wt Readings from Last 3 Encounters:  02/02/20 192 lb 6 oz (87.3 kg)  02/02/20 192 lb 9.6 oz (87.4 kg)  12/11/19 190 lb (86.2 kg)     GEN:  Well nourished, well developed in no acute distress HEENT: Normal NECK: No JVD; No carotid bruits LYMPHATICS: No lymphadenopathy CARDIAC: RRR, no murmurs, rubs, gallops RESPIRATORY:  Clear to auscultation without rales, wheezing or rhonchi  ABDOMEN: Soft, non-tender, non-distended MUSCULOSKELETAL:  No edema; No deformity  SKIN: Warm and dry NEUROLOGIC:  Alert and oriented x 3 PSYCHIATRIC:  Normal affect   ASSESSMENT:    1. Chest pain of uncertain etiology   2. Dyspnea on exertion   3. Essential hypertension   4. Pure hypercholesterolemia   5. Precordial pain   6. Chest pain, unspecified type    PLAN:    In order of problems listed above:  1. Patient with chest pain, typical features.  Has risk factors of age, hypertension, hyperlipidemia.  Mother and sister with CAD.  Get echocardiogram, lexiscan myoview. 2. Patient with worsening dyspnea on exertion.  Echocardiogram as above.  Recently diagnosed with asthma.  On inhalers. 3. History of hypertension, BP controlled.  Continue Toprol XL 4. History of hyperlipidemia, continue Crestor as prescribed.  Follow up after  echocardiogram and myoview  Total encounter time 60 minutes  Greater than 50% was spent in counseling and coordination of care with the patient    Medication Adjustments/Labs and Tests Ordered: Current medicines are reviewed at length with the patient today.  Concerns regarding medicines are outlined above.  Orders Placed This Encounter  Procedures  . NM Myocar Multi W/Spect W/Wall Motion / EF  . EKG 12-Lead  . ECHOCARDIOGRAM COMPLETE   No orders of the defined types were placed in this encounter.   Patient Instructions  Medication Instructions:   Your physician recommends that you continue on your current medications as directed. Please refer to the Current Medication list given to you today.   *If you need a refill on your cardiac medications before your next appointment, please call your pharmacy*   Lab Work: None Ordered    Testing/Procedures:  1)  Your physician has requested that you have an echocardiogram. Echocardiography is a painless test that uses sound waves to create images of your heart. It provides your doctor with information about the size and shape of your heart and how well your heart's chambers and valves are working. This procedure takes approximately one hour. There are no restrictions for this procedure.  2) Your physician has requested that you have a lexiscan myoview.     [place]  Your caregiver has ordered a Stress Test with nuclear imaging. The purpose of this test is to evaluate the blood supply to your heart muscle. This procedure is referred to as a "Non-Invasive Stress Test." This is because other than having an IV started in your vein, nothing is inserted or "invades" your body. Cardiac stress tests are done to find areas of poor blood flow to the heart by determining the extent of coronary artery disease (CAD). Some patients exercise on a treadmill, which naturally increases the blood flow to your heart, while others who are  unable to  walk on a treadmill due to physical limitations have a pharmacologic/chemical stress agent called Lexiscan . This medicine will mimic walking on a treadmill by temporarily increasing your coronary blood  flow.     PLEASE REPORT TO [facility] MEDICAL MALL ENTRANCE  THE VOLUNTEERS AT THE FIRST DESK WILL DIRECT YOU WHERE TO GO  Please note: these test may take anywhere between 2-4 hours to complete    Date of Procedure:_____________________________________  Arrival Time for Procedure:______________________________   __XX__:  Hold betablocker(s) night before procedure and morning of procedure: metoprolol succinate (TOPROL-XL)     PLEASE NOTIFY THE OFFICE AT LEAST 24 HOURS IN ADVANCE IF YOU ARE UNABLE TO KEEP YOUR APPOINTMENT.  [phone] AND  PLEASE NOTIFY NUCLEAR MEDICINE AT [facility] AT LEAST 24 HOURS IN ADVANCE IF YOU ARE UNABLE TO KEEP YOUR APPOINTMENT. [phone]   How to prepare for your Myoview test:  1. Do not eat or drink after midnight 2. No caffeine for 24 hours prior to test 3. No smoking 24 hours prior to test. 4. Your medication may be taken with water.  If your doctor stopped a medication because of this test, do not take that medication. 5. Ladies, please do not wear dresses.  Skirts or pants are appropriate. Please wear a short sleeve shirt. 6. No perfume, cologne or lotion. 7. Wear comfortable walking shoes. No heels!       Follow-Up: At [facility], you and your health needs are our priority.  As part of our continuing mission to provide you with exceptional heart care, we have created designated Provider Care Teams.  These Care Teams include your primary Cardiologist (physician) and Advanced Practice Providers (APPs -  Physician Assistants and Nurse Practitioners) who all work together to provide you with the care you need, when you need it.  We recommend signing up for the patient portal called "MyChart".  Sign up information is provided on this After Visit  Summary.  MyChart is used to connect with patients for Virtual Visits (Telemedicine).  Patients are able to view lab/test results, encounter notes, upcoming appointments, etc.  Non-urgent messages can be sent to your provider as well.   To learn more about what you can do with MyChart, go to [email or web address].    Your next appointment:   Follow up after Echo and Myoview   The format for your next appointment:   In Person  Provider:   Kate Sable, MD   Other Instructions      Signed, Kate Sable, MD  02/02/2020 1:08 PM    [place]

## 2020-02-02 NOTE — Patient Instructions (Signed)
Medication Instructions:   Your physician recommends that you continue on your current medications as directed. Please refer to the Current Medication list given to you today.   *If you need a refill on your cardiac medications before your next appointment, please call your pharmacy*   Lab Work: None Ordered    Testing/Procedures:  1)  Your physician has requested that you have an echocardiogram. Echocardiography is a painless test that uses sound waves to create images of your heart. It provides your doctor with information about the size and shape of your heart and how well your heart's chambers and valves are working. This procedure takes approximately one hour. There are no restrictions for this procedure.  2) Your physician has requested that you have a lexiscan myoview.     [place]  Your caregiver has ordered a Stress Test with nuclear imaging. The purpose of this test is to evaluate the blood supply to your heart muscle. This procedure is referred to as a "Non-Invasive Stress Test." This is because other than having an IV started in your vein, nothing is inserted or "invades" your body. Cardiac stress tests are done to find areas of poor blood flow to the heart by determining the extent of coronary artery disease (CAD). Some patients exercise on a treadmill, which naturally increases the blood flow to your heart, while others who are  unable to walk on a treadmill due to physical limitations have a pharmacologic/chemical stress agent called Lexiscan . This medicine will mimic walking on a treadmill by temporarily increasing your coronary blood flow.     PLEASE REPORT TO [facility] MEDICAL MALL ENTRANCE  THE VOLUNTEERS AT THE FIRST DESK WILL DIRECT YOU WHERE TO GO  Please note: these test may take anywhere between 2-4 hours to complete    Date of Procedure:_____________________________________  Arrival Time for Procedure:______________________________   __XX__:  Hold  betablocker(s) night before procedure and morning of procedure: metoprolol succinate (TOPROL-XL)     PLEASE NOTIFY THE OFFICE AT LEAST 24 HOURS IN ADVANCE IF YOU ARE UNABLE TO KEEP YOUR APPOINTMENT.  [phone] AND  PLEASE NOTIFY NUCLEAR MEDICINE AT [facility] AT LEAST 24 HOURS IN ADVANCE IF YOU ARE UNABLE TO KEEP YOUR APPOINTMENT. [phone]   How to prepare for your Myoview test:  1. Do not eat or drink after midnight 2. No caffeine for 24 hours prior to test 3. No smoking 24 hours prior to test. 4. Your medication may be taken with water.  If your doctor stopped a medication because of this test, do not take that medication. 5. Ladies, please do not wear dresses.  Skirts or pants are appropriate. Please wear a short sleeve shirt. 6. No perfume, cologne or lotion. 7. Wear comfortable walking shoes. No heels!       Follow-Up: At [facility], you and your health needs are our priority.  As part of our continuing mission to provide you with exceptional heart care, we have created designated Provider Care Teams.  These Care Teams include your primary Cardiologist (physician) and Advanced Practice Providers (APPs -  Physician Assistants and Nurse Practitioners) who all work together to provide you with the care you need, when you need it.  We recommend signing up for the patient portal called "MyChart".  Sign up information is provided on this After Visit Summary.  MyChart is used to connect with patients for Virtual Visits (Telemedicine).  Patients are able to view lab/test results, encounter notes, upcoming appointments, etc.  Non-urgent messages can  be sent to your provider as well.   To learn more about what you can do with MyChart, go to [email or web address].    Your next appointment:   Follow up after Echo and Myoview   The format for your next appointment:   In Person  Provider:   Kate Sable, MD   Other Instructions

## 2020-02-02 NOTE — Patient Instructions (Signed)
We are going to give you a trial of Trelegy Ellipta 1 inhalation daily. Sure you rinse your mouth well after you use it.   We are going to get breathing tests ordered.   We'll defer to the cardiologist as to what you need for cardiac evaluation.   We will see you in follow-up in 4 to 6 weeks time call sooner should any new problems arise.

## 2020-02-02 NOTE — Progress Notes (Signed)
Subjective:    [name], female    DOB: 1940-11-02, 79 y.o.   MRN: [id number]  HPI Patient is a 79 year old lifelong never smoker who presents for evaluation of shortness of breath with exertion that has worsened over the last 8 months.  She is kindly referred by Dr. Einar Pheasant.  She has "always" noted some dyspnea on exertion particularly when "hurrying" however this has gotten worse over the last 8 months.  The patient previously had been evaluated by Dr. Cottie Banda at [facility] pulmonology and had been diagnosed with asthma.  She was treated for a while with Advair but she never felt that she could get the inhaler in the been off.  She also has been using albuterol as needed.  Currently she is only on albuterol as needed and does not feel that this is her because she cannot get it in deep.  She was recently started on montelukast by Dr. Nicki Reaper and feels that maybe she is less breathless on this medication.  She had an injury in 2019 to her right shoulder while she was at work.  She notices that her dyspnea may have been worse since that injury.  She notes that resting helps her dyspnea but walking fast exacerbates it.  She has a strong family history of coronary artery disease in her family.  She has not had any fevers, chills or sweats.  Minimal cough.  No sputum production.  He does have issues with gastroesophageal reflux and occasional dysphagia.  Previously has been noted to have a Schatzki's ring.  Being evaluated by GI.  Also has difficulties with occasional tachypalpitations and chest pain.  She is being evaluated by cardiology today as well.  She has frequent gastroesophageal reflux symptoms.  I have reviewed her notes from Dr. Odetta Pink from [facility].  This was back in 2015.  There is also a spirometry obtained at this time, it was noted that she had moderate obstructive defect however there is no postbronchodilator response.  Dr. Letitia Caul notes note that her asthma improve when she  became compliance with the use of her inhaler therapy.  Her peak flows were noted to be as high as 300s on a good day.  Apparently also the patient had bronchoscopy in 2011 and this also showed Mycobacterium avium however she did not require therapy.  She has never had symptoms to suggest active infection.  He has had multiple CT scans of the chest up until 2014 that showed no change.  She also has tested positive in the sputum for Nocardia [place] but had evaluation at infectious diseases at [facility] and it was not deemed necessary to be treated.  Right here was performed in July 2021 and showed no evidence of acute disease.  Review of Systems A 10 point review of systems was performed and it is as noted above otherwise negative.  Past Medical History:  Diagnosis Date  . Fibrocystic breast disease   . Gastritis    EGD - schatzki's ring, gastritis and gastric polyps  . History of colon polyps   . Hypercholesterolemia   . Hypertension   . MAI (mycobacterium avium-intracellulare) infection ([place])   . Osteoporosis   . Reflux esophagitis    Past Surgical History:  Procedure Laterality Date  . ABDOMINAL HYSTERECTOMY  1998  . BREAST EXCISIONAL BIOPSY Bilateral 1970's   benign  . BREAST EXCISIONAL BIOPSY Bilateral 2010   pt  stated dr Tamala Julian did bilat axilla lumps neg   .  BREAST SURGERY     cyst(benign)  . COLONOSCOPY N/A 09/28/2014   Procedure: COLONOSCOPY;  Surgeon: Manya Silvas, MD;  Location: [facility] ENDOSCOPY;  Service: Endoscopy;  Laterality: N/A;  . ESOPHAGOGASTRODUODENOSCOPY N/A 09/28/2014   Procedure: ESOPHAGOGASTRODUODENOSCOPY (EGD);  Surgeon: Manya Silvas, MD;  Location: [facility] ENDOSCOPY;  Service: Endoscopy;  Laterality: N/A;  . ESOPHAGOGASTRODUODENOSCOPY (EGD) WITH PROPOFOL N/A 04/26/2016   Procedure: ESOPHAGOGASTRODUODENOSCOPY (EGD) WITH PROPOFOL;  Surgeon: Manya Silvas, MD;  Location: [facility] ENDOSCOPY;  Service: Endoscopy;  Laterality: N/A;  . excision of right axillary lipoma  10/07   . FOOT SURGERY Right   . HEMORRHOID SURGERY    . HERNIA REPAIR  10/07  . NASAL SEPTUM SURGERY     Family History  Problem Relation Age of Onset  . Heart disease Mother 68       heart attack  . Cancer Father        lung  . Stroke Brother   . Diabetes Brother   . Diabetes Sister   . Breast cancer Sister 29   Social History   Tobacco Use  . Smoking status: Never Smoker  . Smokeless tobacco: Never Used  Substance Use Topics  . Alcohol use: No    Alcohol/week: 0.0 standard drinks   No significant occupational history.  Worked until 2019.  No Known Allergies   Current Meds  Medication Sig  . albuterol (VENTOLIN HFA) 108 (90 Base) MCG/ACT inhaler Inhale 2 puffs into the lungs every 6 (six) hours as needed.  Marland Kitchen aspirin (ASPIRIN EC) 81 MG EC tablet Take 81 mg by mouth daily. Swallow whole.  Marland Kitchen Fluticasone-Salmeterol (ADVAIR) 250-50 MCG/DOSE AEPB Inhale 2 puffs into the lungs 2 (two) times daily.  . metoprolol succinate (TOPROL-XL) 25 MG 24 hr tablet Take 1 tablet (25 mg total) by mouth 2 (two) times daily.  . montelukast (SINGULAIR) 10 MG tablet TAKE 1 TABLET BY MOUTH EVERYDAY AT BEDTIME  . Multiple Vitamin (MULTIVITAMIN WITH MINERALS) TABS Take 1 tablet by mouth daily.  . Omega-3 Fatty Acids (FISH OIL) 1000 MG CAPS Take 3,000 mg by mouth daily.  Marland Kitchen omeprazole (PRILOSEC) 20 MG capsule Take 1 capsule (20 mg total) by mouth 2 (two) times daily.  . pantoprazole (PROTONIX) 40 MG tablet Take 1 tablet (40 mg total) by mouth 2 (two) times daily before a meal.  . rosuvastatin (CRESTOR) 10 MG tablet TAKE 1 TABLET DAILY   Immunization History  Administered Date(s) Administered  . Influenza Split 02/07/2013, 03/12/2014  . Influenza-Unspecified 03/12/2014, 03/11/2015  . PFIZER SARS-COV-2 Vaccination 05/22/2019, 06/12/2019  . Pneumococcal Conjugate-13 02/17/2015       Objective:   Physical Exam BP 132/78 (BP Location: Left Arm, Cuff Size: Normal)   Pulse 93   Temp 97.8 F (36.6 C)  (Temporal)   Ht 5\' 4"  (1.626 m)   Wt 192 lb 9.6 oz (87.4 kg)   SpO2 95%   BMI 33.06 kg/m  GENERAL: Well-developed, overweight woman in no acute distress.  Fully ambulatory.   HEAD: Normocephalic, atraumatic.  EYES: Pupils equal, round, reactive to light.  No scleral icterus.  MOUTH: Nose/mouth/throat not examined due to masking requirements for COVID 19.   NECK: Supple. No thyromegaly. Trachea midline. No JVD.  No adenopathy. PULMONARY: Good air entry bilaterally.  No adventitious sounds. CARDIOVASCULAR: S1 and S2. Regular rate and rhythm.  No rubs, murmurs or gallops heard. ABDOMEN: Benign MUSCULOSKELETAL: He has significant OA changes in her hands, no clubbing, no edema.  NEUROLOGIC: No focal deficits  noted.  Fluent speech, no gait disturbance. SKIN: Intact,warm,dry.  Limited exam shows no rashes. PSYCH: Mood and behavior are appropriate.      Assessment & Plan:     ICD-10-CM   1. SOB (shortness of breath)  R06.02 Pulmonary Function Test [facility] Only    CANCELED: Pulmonary Function Test [facility] Only   Will obtain PFTs She is getting cardiac work-up as well which is appropriate  Defer to cardiology as to imaging of the heart  2. Moderate persistent asthma, unspecified whether complicated  G01.74    Noted on prior PFTs from [facility] Likely has asthma airway remodeling Trial of Trelegy 100/62.5/25 1 puff daily Trial of Trelegy Ellipta 100 1 inhalation daily   Orders Placed This Encounter  Procedures  . Pulmonary Function Test [facility] Only    Standing Status:   Future    Standing Expiration Date:   02/01/2021    Scheduling Instructions:     Within 4 wk    Order Specific Question:   Full PFT: includes the following: basic spirometry, spirometry pre & post bronchodilator, diffusion capacity (DLCO), lung volumes    Answer:   Full PFT   Meds ordered this encounter  Medications  . Fluticasone-Umeclidin-Vilant (TRELEGY ELLIPTA) 100-62.5-25 MCG/INH AEPB    Sig: Inhale 1 puff into the lungs  daily.    Dispense:  3 each    Refill:  3  . Fluticasone-Umeclidin-Vilant (TRELEGY ELLIPTA) 100-62.5-25 MCG/INH AEPB    Sig: Inhale 1 puff into the lungs daily for 1 day.    Dispense:  14 each    Refill:  0    Order Specific Question:   Lot Number?    Answer:   [id number]    Order Specific Question:   Expiration Date?    Answer:   07/13/2021    Order Specific Question:   Manufacturer?    Answer:   [employer] [[id number]]    Order Specific Question:   Quantity    Answer:   1   Discussion:  Patient presents with the complaint of dyspnea which has been worsening over the last 8 months.  She is undergoing cardiac evaluation and I will defer cardiac study orders to cardiology.  With regards to her pulmonary issues I recommend pulmonary function testing which will be ordered.  I suspect that she has moderate persistent asthma with airway remodeling and COPD features due to this.  I recommend a trial of Trelegy Ellipta 1 inhalation daily.  In the past that she has had trouble with MDIs due to likely faulty technique.  We will see the patient in 4 to 6 weeks time she is to contact us prior to that time should any new difficulties arise.   Thank you for allowing me to participate in this patient's care.   Renold Don, MD  PCCM   *This note was dictated using voice recognition software/Dragon.  Despite best efforts to proofread, errors can occur which can change the meaning.  Any change was purely unintentional.

## 2020-02-03 ENCOUNTER — Other Ambulatory Visit: Payer: Self-pay | Admitting: Internal Medicine

## 2020-02-03 DIAGNOSIS — Z1231 Encounter for screening mammogram for malignant neoplasm of breast: Secondary | ICD-10-CM

## 2020-02-04 ENCOUNTER — Other Ambulatory Visit: Payer: Self-pay

## 2020-02-04 ENCOUNTER — Ambulatory Visit (INDEPENDENT_AMBULATORY_CARE_PROVIDER_SITE_OTHER): Payer: Medicare Other | Admitting: Internal Medicine

## 2020-02-04 DIAGNOSIS — K219 Gastro-esophageal reflux disease without esophagitis: Secondary | ICD-10-CM

## 2020-02-04 DIAGNOSIS — D329 Benign neoplasm of meninges, unspecified: Secondary | ICD-10-CM | POA: Diagnosis not present

## 2020-02-04 DIAGNOSIS — R0602 Shortness of breath: Secondary | ICD-10-CM | POA: Diagnosis not present

## 2020-02-04 DIAGNOSIS — R739 Hyperglycemia, unspecified: Secondary | ICD-10-CM

## 2020-02-04 DIAGNOSIS — I10 Essential (primary) hypertension: Secondary | ICD-10-CM

## 2020-02-04 DIAGNOSIS — R1310 Dysphagia, unspecified: Secondary | ICD-10-CM | POA: Diagnosis not present

## 2020-02-04 DIAGNOSIS — E7800 Pure hypercholesterolemia, unspecified: Secondary | ICD-10-CM | POA: Diagnosis not present

## 2020-02-04 MED ORDER — MONTELUKAST SODIUM 10 MG PO TABS
ORAL_TABLET | ORAL | 1 refills | Status: DC
Start: 2020-02-04 — End: 2020-02-20

## 2020-02-04 NOTE — Progress Notes (Signed)
[name], female   DOB: [birth year], 79 y.o.   MRN: [id number]   Subjective:    [name], female    DOB: 05/16/1940, 79 y.o.   MRN: [id number]  HPI This visit occurred during the SARS-CoV-2 public health emergency.  Safety protocols were in place, including screening questions prior to the visit, additional usage of staff PPE, and extensive cleaning of exam room while observing appropriate contact time as indicated for disinfecting solutions.  Patient here for a scheduled follow up.  Last visit, reported some increased chest pain and sob.  Saw pulmonary. Planning for PFTs.  Also saw cardiology.  Recommended echo and lexiscan myoview.  She is taking singulair. Also prescribed trelegy.  Feels her breathing is better.  Overall feels better.  Eating.  No nausea or vomiting.  Bowels moving.  Still with increased stress - sister medical issues, family stress, etc.  Overall she feels she is handling things relatively well.    Past Medical History:  Diagnosis Date  . Fibrocystic breast disease   . Gastritis    EGD - schatzki's ring, gastritis and gastric polyps  . History of colon polyps   . Hypercholesterolemia   . Hypertension   . MAI (mycobacterium avium-intracellulare) infection ([place])   . Osteoporosis   . Reflux esophagitis    Past Surgical History:  Procedure Laterality Date  . ABDOMINAL HYSTERECTOMY  1998  . BREAST EXCISIONAL BIOPSY Bilateral 1970's   benign  . BREAST EXCISIONAL BIOPSY Bilateral 2010   pt  stated dr Tamala Julian did bilat axilla lumps neg   . BREAST SURGERY     cyst(benign)  . COLONOSCOPY N/A 09/28/2014   Procedure: COLONOSCOPY;  Surgeon: Manya Silvas, MD;  Location: [facility] ENDOSCOPY;  Service: Endoscopy;  Laterality: N/A;  . ESOPHAGOGASTRODUODENOSCOPY N/A 09/28/2014   Procedure: ESOPHAGOGASTRODUODENOSCOPY (EGD);  Surgeon: Manya Silvas, MD;  Location: [facility] ENDOSCOPY;  Service: Endoscopy;  Laterality: N/A;  . ESOPHAGOGASTRODUODENOSCOPY  (EGD) WITH PROPOFOL N/A 04/26/2016   Procedure: ESOPHAGOGASTRODUODENOSCOPY (EGD) WITH PROPOFOL;  Surgeon: Manya Silvas, MD;  Location: [facility] ENDOSCOPY;  Service: Endoscopy;  Laterality: N/A;  . excision of right axillary lipoma  10/07  . FOOT SURGERY Right   . HEMORRHOID SURGERY    . HERNIA REPAIR  10/07  . NASAL SEPTUM SURGERY     Family History  Problem Relation Age of Onset  . Heart disease Mother 58       heart attack  . Cancer Father        lung  . Stroke Brother   . Diabetes Brother   . Diabetes Sister   . Breast cancer Sister 58   Social History   Socioeconomic History  . Marital status: Widowed    Spouse name: Not on file  . Number of children: Not on file  . Years of education: Not on file  . Highest education level: Not on file  Occupational History  . Not on file  Tobacco Use  . Smoking status: Never Smoker  . Smokeless tobacco: Never Used  Vaping Use  . Vaping Use: Never used  Substance and Sexual Activity  . Alcohol use: No    Alcohol/week: 0.0 standard drinks  . Drug use: No  . Sexual activity: Not on file  Other Topics Concern  . Not on file  Social History Narrative  . Not on file   Social Determinants of Health   Financial Resource Strain:   . Difficulty of Paying Living Expenses:  Not on file  Food Insecurity:   . Worried About Charity fundraiser in the Last Year: Not on file  . Ran Out of Food in the Last Year: Not on file  Transportation Needs:   . Lack of Transportation (Medical): Not on file  . Lack of Transportation (Non-Medical): Not on file  Physical Activity:   . Days of Exercise per Week: Not on file  . Minutes of Exercise per Session: Not on file  Stress:   . Feeling of Stress : Not on file  Social Connections:   . Frequency of Communication with Friends and Family: Not on file  . Frequency of Social Gatherings with Friends and Family: Not on file  . Attends Religious Services: Not on file  . Active Member of Clubs or  Organizations: Not on file  . Attends Archivist Meetings: Not on file  . Marital Status: Not on file    Outpatient Encounter Medications as of 02/04/2020  Medication Sig  . albuterol (VENTOLIN HFA) 108 (90 Base) MCG/ACT inhaler Inhale 2 puffs into the lungs every 6 (six) hours as needed.  Marland Kitchen aspirin (ASPIRIN EC) 81 MG EC tablet Take 81 mg by mouth daily. Swallow whole.  Marland Kitchen Fluticasone-Umeclidin-Vilant (TRELEGY ELLIPTA) 100-62.5-25 MCG/INH AEPB Inhale 1 puff into the lungs daily.  . metoprolol succinate (TOPROL-XL) 25 MG 24 hr tablet Take 1 tablet (25 mg total) by mouth 2 (two) times daily.  . montelukast (SINGULAIR) 10 MG tablet TAKE 1 TABLET BY MOUTH EVERYDAY AT BEDTIME  . Multiple Vitamin (MULTIVITAMIN WITH MINERALS) TABS Take 1 tablet by mouth daily.  . Omega-3 Fatty Acids (FISH OIL) 1000 MG CAPS Take 3,000 mg by mouth daily.  Marland Kitchen omeprazole (PRILOSEC) 20 MG capsule Take 1 capsule (20 mg total) by mouth 2 (two) times daily.  . pantoprazole (PROTONIX) 40 MG tablet Take 1 tablet (40 mg total) by mouth 2 (two) times daily before a meal.  . rosuvastatin (CRESTOR) 10 MG tablet TAKE 1 TABLET DAILY  . [DISCONTINUED] montelukast (SINGULAIR) 10 MG tablet TAKE 1 TABLET BY MOUTH EVERYDAY AT BEDTIME   No facility-administered encounter medications on file as of 02/04/2020.    Review of Systems  Constitutional: Negative for appetite change and unexpected weight change.  HENT: Negative for congestion and sinus pressure.   Respiratory: Negative for cough and chest tightness.        Breathing is better on current medication regimen.   Cardiovascular: Negative for palpitations and leg swelling.  Gastrointestinal: Negative for abdominal pain, diarrhea, nausea and vomiting.  Genitourinary: Negative for difficulty urinating and dysuria.  Musculoskeletal: Negative for joint swelling and myalgias.  Skin: Negative for color change and rash.  Neurological: Negative for dizziness, light-headedness and  headaches.  Psychiatric/Behavioral: Negative for agitation and dysphoric mood.       Objective:    Physical Exam Vitals reviewed.  Constitutional:      General: She is not in acute distress.    Appearance: Normal appearance.  HENT:     Head: Normocephalic and atraumatic.     Right Ear: External ear normal.     Left Ear: External ear normal.  Eyes:     General: No scleral icterus.       Right eye: No discharge.        Left eye: No discharge.     Conjunctiva/sclera: Conjunctivae normal.  Neck:     Thyroid: No thyromegaly.  Cardiovascular:     Rate and Rhythm: Normal rate and regular  rhythm.  Pulmonary:     Effort: No respiratory distress.     Breath sounds: Normal breath sounds. No wheezing.  Abdominal:     General: Bowel sounds are normal.     Palpations: Abdomen is soft.     Tenderness: There is no abdominal tenderness.  Musculoskeletal:        General: No swelling or tenderness.     Cervical back: Neck supple. No tenderness.  Lymphadenopathy:     Cervical: No cervical adenopathy.  Skin:    Findings: No erythema or rash.  Neurological:     Mental Status: She is alert.  Psychiatric:        Mood and Affect: Mood normal.        Behavior: Behavior normal.     BP 126/70   Pulse 89   Temp 98.1 F (36.7 C) (Oral)   Resp 16   Ht $R'5\' 4"'cs$  (1.626 m)   Wt 192 lb (87.1 kg)   SpO2 97%   BMI 32.96 kg/m  Wt Readings from Last 3 Encounters:  02/04/20 192 lb (87.1 kg)  02/02/20 192 lb 6 oz (87.3 kg)  02/02/20 192 lb 9.6 oz (87.4 kg)     Lab Results  Component Value Date   WBC 5.2 12/11/2019   HGB 14.8 12/11/2019   HCT 43.7 12/11/2019   PLT 231.0 12/11/2019   GLUCOSE 108 (H) 12/11/2019   CHOL 149 12/11/2019   TRIG 98.0 12/11/2019   HDL 79.30 12/11/2019   LDLCALC 50 12/11/2019   ALT 31 12/11/2019   AST 24 12/11/2019   NA 139 12/11/2019   K 4.9 12/11/2019   CL 101 12/11/2019   CREATININE 0.78 12/11/2019   BUN 18 12/11/2019   CO2 32 12/11/2019   TSH 1.68  12/11/2019   HGBA1C 6.1 12/11/2019    DG Bone Density  Result Date: 11/13/2018 EXAM: DUAL X-RAY ABSORPTIOMETRY (DXA) FOR BONE MINERAL DENSITY IMPRESSION: Technologist:VLM Your patient Sarajane Fambrough completed a BMD test on 11/13/2018 using the [place] (analysis version: 14.10) manufactured by [employer]. The following summarizes the results of our evaluation. PATIENT BIOGRAPHICAL: Name: Christella, App Patient ID: [id number] Birth Date: 05/08/1941 Height: 63.0 in. Gender: Female Exam Date: 11/13/2018 Weight: 184.0 lbs. Indications: Advanced Age, Asthma, Caucasian, Hysterectomy, Postmenopausal Fractures: Left shoulder, Left wrist Treatments: Albuterol, Multi-Vitamin ASSESSMENT: The BMD measured at Femur Total Left is 0.682 g/cm2 with a T-score of -2.6. This patient is considered osteoporotic according to [facility]) criteria. Lumbar spine was not utilized due to advanced degenerative changes. The scan quality is good. Site Region Measured Measured WHO Young Adult BMD Date       Age      Classification T-score DualFemur Total Left 11/13/2018 76.8 Osteoporosis -2.6 0.682 g/cm2 Right Forearm Radius 33% 11/13/2018 76.8 Normal -0.9 0.794 g/cm2 World Health Organization [facility]) criteria for post-menopausal, Caucasian Women: Normal:       T-score at or above -1 SD Osteopenia:   T-score between -1 and -2.5 SD Osteoporosis: T-score at or below -2.5 SD RECOMMENDATIONS: 1. All patients should optimize calcium and vitamin D intake. 2. Consider FDA-approved medical therapies in postmenopausal women and men aged 75 years and older, based on the following: a. A hip or vertebral(clinical or morphometric) fracture b. T-score < -2.5 at the femoral neck or spine after appropriate evaluation to exclude secondary causes c. Low bone mass (T-score between -1.0 and -2.5 at the femoral neck or spine) and a 10-year probability of a  hip fracture > 3% or a 10-year probability of a major osteoporosis-related  fracture > 20% based on the [place]-adapted WHO algorithm d. Clinician judgment and/or patient preferences may indicate treatment for people with 10-year fracture probabilities above or below these levels FOLLOW-UP: People with diagnosed cases of osteoporosis or at high risk for fracture should have regular bone mineral density tests. For patients eligible for Medicare, routine testing is allowed once every 2 years. The testing frequency can be increased to one year for patients who have rapidly progressing disease, those who are receiving or discontinuing medical therapy to restore bone mass, or have additional risk factors. Electronically Signed   By: Marlaine Hind M.D.   On: 11/13/2018 13:45   MM 3D SCREEN BREAST BILATERAL  Result Date: 11/13/2018 CLINICAL DATA:  Screening. EXAM: DIGITAL SCREENING BILATERAL MAMMOGRAM WITH TOMO AND CAD COMPARISON:  Previous exam(s). ACR Breast Density Category b: There are scattered areas of fibroglandular density. FINDINGS: There are no findings suspicious for malignancy. Images were processed with CAD. IMPRESSION: No mammographic evidence of malignancy. A result letter of this screening mammogram will be mailed directly to the patient. RECOMMENDATION: Screening mammogram in one year. (Code:SM-B-01Y) BI-RADS CATEGORY  1: Negative. Electronically Signed   By: Margarette Canada M.D.   On: 11/13/2018 15:47       Assessment & Plan:   Problem List Items Addressed This Visit    SOB (shortness of breath)    Work up in progress.  She has seen pulmonary. Planning for PFTs.  Also has seen cardiology. Planning for echo and lexiscan myoview.  Breathing is better.  On singulair and pulmonary recommended trial of trelegy.  Follow.        Meningioma [facility])    Last evaluation with neurology, they reviewed MRI and felt stable. No f/u recommended.        Hyperglycemia    Low carb diet and exercise.  Follow met b and a1c.       Relevant Orders   Hemoglobin A1c   Hypercholesterolemia     On crestor.  Low cholesterol diet and exercise.  Follow lipid panel and liver function tests.        Relevant Orders   Hepatic function panel   Lipid panel   GERD (gastroesophageal reflux disease)    On omeprazole.        Essential hypertension, benign    Blood pressure doing well. On metoprolol.  Follow pressures.follow metabolic panel.       Relevant Orders   Basic metabolic panel   Dysphagia    See last note for details regarding swallowing issues.  Once above w/up complete, pursue further GI w/up.           Einar Pheasant, MD

## 2020-02-09 ENCOUNTER — Encounter: Payer: Self-pay | Admitting: Internal Medicine

## 2020-02-09 NOTE — Assessment & Plan Note (Signed)
On omeprazole.  

## 2020-02-09 NOTE — Assessment & Plan Note (Signed)
Low carb diet and exercise.  Follow met b and a1c.  

## 2020-02-09 NOTE — Assessment & Plan Note (Signed)
See last note for details regarding swallowing issues.  Once above w/up complete, pursue further GI w/up.

## 2020-02-09 NOTE — Assessment & Plan Note (Signed)
Work up in progress.  She has seen pulmonary. Planning for PFTs.  Also has seen cardiology. Planning for echo and lexiscan myoview.  Breathing is better.  On singulair and pulmonary recommended trial of trelegy.  Follow.

## 2020-02-09 NOTE — Assessment & Plan Note (Signed)
Last evaluation with neurology, they reviewed MRI and felt stable. No f/u recommended.

## 2020-02-09 NOTE — Assessment & Plan Note (Signed)
On crestor.  Low cholesterol diet and exercise.  Follow lipid panel and liver function tests.   

## 2020-02-09 NOTE — Assessment & Plan Note (Signed)
Blood pressure doing well. On metoprolol.  Follow pressures.follow metabolic panel.

## 2020-02-16 ENCOUNTER — Encounter: Payer: Medicare Other | Admitting: Internal Medicine

## 2020-02-17 DIAGNOSIS — L578 Other skin changes due to chronic exposure to nonionizing radiation: Secondary | ICD-10-CM | POA: Diagnosis not present

## 2020-02-17 DIAGNOSIS — X32XXXA Exposure to sunlight, initial encounter: Secondary | ICD-10-CM | POA: Diagnosis not present

## 2020-02-17 DIAGNOSIS — L570 Actinic keratosis: Secondary | ICD-10-CM | POA: Diagnosis not present

## 2020-02-18 ENCOUNTER — Ambulatory Visit
Admission: RE | Admit: 2020-02-18 | Discharge: 2020-02-18 | Disposition: A | Discharge status: Home / Self Care | Payer: Medicare Other | Source: Ambulatory Visit | Attending: Cardiology | Admitting: Cardiology

## 2020-02-18 ENCOUNTER — Other Ambulatory Visit: Payer: Self-pay

## 2020-02-18 DIAGNOSIS — R079 Chest pain, unspecified: Secondary | ICD-10-CM | POA: Insufficient documentation

## 2020-02-18 LAB — NM MYOCAR MULTI W/SPECT W/WALL MOTION / EF
LV dias vol: 56 mL (ref 46–106)
LV sys vol: 21 mL
Peak HR: 98 {beats}/min
Percent HR: 69 %
Rest HR: 71 {beats}/min
SDS: 0
SRS: 1
SSS: 0
TID: 0.89

## 2020-02-18 MED ORDER — REGADENOSON 0.4 MG/5ML IV SOLN
0.4000 mg | Freq: Once | INTRAVENOUS | Status: AC
  Administered 2020-02-18: 0.4 mg via INTRAVENOUS

## 2020-02-18 MED ORDER — TECHNETIUM TC 99M TETROFOSMIN IV KIT
31.1990 | PACK | Freq: Once | INTRAVENOUS | Status: AC | PRN
  Administered 2020-02-18: 31.199 via INTRAVENOUS

## 2020-02-18 MED ORDER — TECHNETIUM TC 99M TETROFOSMIN IV KIT
10.0000 | PACK | Freq: Once | INTRAVENOUS | Status: AC | PRN
  Administered 2020-02-18: 9.859 via INTRAVENOUS

## 2020-02-20 ENCOUNTER — Other Ambulatory Visit: Payer: Self-pay | Admitting: Internal Medicine

## 2020-02-20 ENCOUNTER — Telehealth: Payer: Self-pay

## 2020-02-20 NOTE — Telephone Encounter (Signed)
The patient has been notified of the result via VM per DPR on file.  Encouraged patient to call back with any questions or concerns.  

## 2020-02-20 NOTE — Telephone Encounter (Signed)
The patient has been notified of the result and verbalized understanding.  All questions (if any) were answered. Kavin Leech, RN 02/20/2020 4:21 PM

## 2020-02-24 ENCOUNTER — Other Ambulatory Visit: Payer: Self-pay

## 2020-02-24 ENCOUNTER — Ambulatory Visit (INDEPENDENT_AMBULATORY_CARE_PROVIDER_SITE_OTHER): Payer: Medicare Other

## 2020-02-24 DIAGNOSIS — R0600 Dyspnea, unspecified: Secondary | ICD-10-CM

## 2020-02-24 DIAGNOSIS — R079 Chest pain, unspecified: Secondary | ICD-10-CM

## 2020-02-24 DIAGNOSIS — R0609 Other forms of dyspnea: Secondary | ICD-10-CM

## 2020-02-24 LAB — ECHOCARDIOGRAM COMPLETE
AR max vel: 2.47 cm2
AV Area VTI: 2.66 cm2
AV Area mean vel: 2.73 cm2
AV Mean grad: 3 mmHg
AV Peak grad: 6 mmHg
Ao pk vel: 1.22 m/s
Area-P 1/2: 3.13 cm2
S' Lateral: 2.62 cm

## 2020-02-24 MED ORDER — PERFLUTREN LIPID MICROSPHERE
1.0000 mL | INTRAVENOUS | Status: AC | PRN
  Administered 2020-02-24: 2 mL via INTRAVENOUS

## 2020-02-25 ENCOUNTER — Other Ambulatory Visit: Payer: Self-pay | Admitting: Internal Medicine

## 2020-02-26 ENCOUNTER — Telehealth: Payer: Self-pay

## 2020-02-26 NOTE — Telephone Encounter (Signed)
Left voicemail message advising patient to call back to discuss echo results.

## 2020-02-27 ENCOUNTER — Telehealth: Payer: Self-pay

## 2020-02-27 NOTE — Telephone Encounter (Signed)
Patient is aware of date/time of covid test prior to PFT.  

## 2020-03-01 ENCOUNTER — Ambulatory Visit: Payer: Medicare Other | Admitting: Cardiology

## 2020-03-02 ENCOUNTER — Ambulatory Visit (INDEPENDENT_AMBULATORY_CARE_PROVIDER_SITE_OTHER): Payer: Medicare Other | Admitting: Cardiology

## 2020-03-02 ENCOUNTER — Other Ambulatory Visit
Admission: RE | Admit: 2020-03-02 | Discharge: 2020-03-02 | Disposition: A | Discharge status: Home / Self Care | Payer: Medicare Other | Source: Ambulatory Visit | Attending: Pulmonary Disease | Admitting: Pulmonary Disease

## 2020-03-02 ENCOUNTER — Other Ambulatory Visit: Payer: Self-pay

## 2020-03-02 ENCOUNTER — Encounter: Payer: Self-pay | Admitting: Cardiology

## 2020-03-02 VITALS — BP 140/60 | HR 64 | Ht 64.0 in | Wt 196.0 lb

## 2020-03-02 DIAGNOSIS — R079 Chest pain, unspecified: Secondary | ICD-10-CM

## 2020-03-02 DIAGNOSIS — Z20822 Contact with and (suspected) exposure to covid-19: Secondary | ICD-10-CM | POA: Insufficient documentation

## 2020-03-02 DIAGNOSIS — I10 Essential (primary) hypertension: Secondary | ICD-10-CM

## 2020-03-02 DIAGNOSIS — E7800 Pure hypercholesterolemia, unspecified: Secondary | ICD-10-CM | POA: Diagnosis not present

## 2020-03-02 DIAGNOSIS — Z01812 Encounter for preprocedural laboratory examination: Secondary | ICD-10-CM | POA: Diagnosis not present

## 2020-03-02 LAB — SARS CORONAVIRUS 2 (TAT 6-24 HRS): SARS Coronavirus 2: NEGATIVE

## 2020-03-02 NOTE — Progress Notes (Signed)
Cardiology Office Note:    Date:  03/02/2020   ID:  [name], DOB December 07, 1940, MRN [id number]  PCP:  Einar Pheasant, MD  [facility] Cardiologist:  Kate Sable, MD  [facility] Electrophysiologist:  None   Referring MD: Einar Pheasant, MD   Chief Complaint  Patient presents with  . OTHER    F/u echo/myoview. Refused EKG. Meds reviewed verbally with pt.    History of Present Illness:    Angela Cohen is a 79 y.o. female with a hx of hypertension, hyperlipidemia who presents for follow-up.  She was last seen due to chest pain and dyspnea on exertion occurring over 6 months, resolving with rest.  Due to risk factors and also symptoms consistent with angina, echo and Myoview was ordered to evaluate cardiac function.  Patient states doing okay, denies any chest pain or shortness of breath.  Shortness of breath has improved since patient's was started on trilogy for her asthma.  Also has esophageal dysfunction causing dysphagia times which might be contributing to her symptoms.   Prior notes Patient was previously seen for cardiology/cardiac care by [facility] cardiology in 2017.  She was diagnosed with Schatzki's ring being evaluated for possible esophageal dilatation.  Due to symptoms of chest pain, echo and Myoview was ordered.  Myoview on 04/2016 showed no evidence for ischemia.  Echo on 04/2016 showed normal systolic function, EF 96%, mild MR, mild TRher mother passed away from heart attack in her 28s.  Her sister had CABG in her 8s.   Past Medical History:  Diagnosis Date  . Fibrocystic breast disease   . Gastritis    EGD - schatzki's ring, gastritis and gastric polyps  . History of colon polyps   . Hypercholesterolemia   . Hypertension   . MAI (mycobacterium avium-intracellulare) infection ([place])   . Osteoporosis   . Reflux esophagitis     Past Surgical History:  Procedure Laterality Date  . ABDOMINAL HYSTERECTOMY  1998  . BREAST EXCISIONAL BIOPSY Bilateral  1970's   benign  . BREAST EXCISIONAL BIOPSY Bilateral 2010   pt  stated dr Tamala Julian did bilat axilla lumps neg   . BREAST SURGERY     cyst(benign)  . COLONOSCOPY N/A 09/28/2014   Procedure: COLONOSCOPY;  Surgeon: Manya Silvas, MD;  Location: [facility] ENDOSCOPY;  Service: Endoscopy;  Laterality: N/A;  . ESOPHAGOGASTRODUODENOSCOPY N/A 09/28/2014   Procedure: ESOPHAGOGASTRODUODENOSCOPY (EGD);  Surgeon: Manya Silvas, MD;  Location: [facility] ENDOSCOPY;  Service: Endoscopy;  Laterality: N/A;  . ESOPHAGOGASTRODUODENOSCOPY (EGD) WITH PROPOFOL N/A 04/26/2016   Procedure: ESOPHAGOGASTRODUODENOSCOPY (EGD) WITH PROPOFOL;  Surgeon: Manya Silvas, MD;  Location: [facility] ENDOSCOPY;  Service: Endoscopy;  Laterality: N/A;  . excision of right axillary lipoma  10/07  . FOOT SURGERY Right   . HEMORRHOID SURGERY    . HERNIA REPAIR  10/07  . NASAL SEPTUM SURGERY      Current Medications: Current Meds  Medication Sig  . aspirin (ASPIRIN EC) 81 MG EC tablet Take 81 mg by mouth daily. Swallow whole.  Marland Kitchen Fluticasone-Umeclidin-Vilant (TRELEGY ELLIPTA) 100-62.5-25 MCG/INH AEPB Inhale 1 puff into the lungs daily.  . metoprolol succinate (TOPROL-XL) 25 MG 24 hr tablet TAKE 1 TABLET TWICE A DAY  . montelukast (SINGULAIR) 10 MG tablet TAKE 1 TABLET BY MOUTH EVERYDAY AT BEDTIME  . Multiple Vitamin (MULTIVITAMIN WITH MINERALS) TABS Take 1 tablet by mouth daily.  . Omega-3 Fatty Acids (FISH OIL) 1000 MG CAPS Take 3,000 mg by mouth daily.  Marland Kitchen omeprazole (  PRILOSEC) 20 MG capsule Take 1 capsule (20 mg total) by mouth 2 (two) times daily.  . rosuvastatin (CRESTOR) 10 MG tablet TAKE 1 TABLET DAILY     Allergies:   Patient has no known allergies.   Social History   Socioeconomic History  . Marital status: Widowed    Spouse name: Not on file  . Number of children: Not on file  . Years of education: Not on file  . Highest education level: Not on file  Occupational History  . Not on file  Tobacco Use  . Smoking status:  Never Smoker  . Smokeless tobacco: Never Used  Vaping Use  . Vaping Use: Never used  Substance and Sexual Activity  . Alcohol use: No    Alcohol/week: 0.0 standard drinks  . Drug use: No  . Sexual activity: Not on file  Other Topics Concern  . Not on file  Social History Narrative  . Not on file   Social Determinants of Health   Financial Resource Strain:   . Difficulty of Paying Living Expenses: Not on file  Food Insecurity:   . Worried About Charity fundraiser in the Last Year: Not on file  . Ran Out of Food in the Last Year: Not on file  Transportation Needs:   . Lack of Transportation (Medical): Not on file  . Lack of Transportation (Non-Medical): Not on file  Physical Activity:   . Days of Exercise per Week: Not on file  . Minutes of Exercise per Session: Not on file  Stress:   . Feeling of Stress : Not on file  Social Connections:   . Frequency of Communication with Friends and Family: Not on file  . Frequency of Social Gatherings with Friends and Family: Not on file  . Attends Religious Services: Not on file  . Active Member of Clubs or Organizations: Not on file  . Attends Archivist Meetings: Not on file  . Marital Status: Not on file     Family History: The patient's family history includes Breast cancer (age of onset: 41) in her sister; Cancer in her father; Diabetes in her brother and sister; Heart disease (age of onset: 24) in her mother; Stroke in her brother.  ROS:   Please see the history of present illness.     All other systems reviewed and are negative.  EKGs/Labs/Other Studies Reviewed:    The following studies were reviewed today:   EKG:  EKG not  ordered today.    Recent Labs: 12/11/2019: ALT 31; BUN 18; Creatinine, Ser 0.78; Hemoglobin 14.8; Platelets 231.0; Potassium 4.9; Sodium 139; TSH 1.68  Recent Lipid Panel    Component Value Date/Time   CHOL 149 12/11/2019 1122   TRIG 98.0 12/11/2019 1122   HDL 79.30 12/11/2019 1122    CHOLHDL 2 12/11/2019 1122   VLDL 19.6 12/11/2019 1122   LDLCALC 50 12/11/2019 1122    Physical Exam:    VS:  BP 140/60 (BP Location: Left Arm, Patient Position: Sitting, Cuff Size: Normal)   Pulse 64   Ht 5\' 4"  (1.626 m)   Wt 196 lb (88.9 kg)   SpO2 98%   BMI 33.64 kg/m     Wt Readings from Last 3 Encounters:  03/02/20 196 lb (88.9 kg)  02/04/20 192 lb (87.1 kg)  02/02/20 192 lb 6 oz (87.3 kg)     GEN:  Well nourished, well developed in no acute distress HEENT: Normal NECK: No JVD; No carotid bruits LYMPHATICS:  No lymphadenopathy CARDIAC: RRR, no murmurs, rubs, gallops RESPIRATORY:  Clear to auscultation without rales, wheezing or rhonchi  ABDOMEN: Soft, non-tender, non-distended MUSCULOSKELETAL:  No edema; No deformity  SKIN: Warm and dry NEUROLOGIC:  Alert and oriented x 3 PSYCHIATRIC:  Normal affect   ASSESSMENT:    1. Chest pain of uncertain etiology   2. Essential hypertension   3. Pure hypercholesterolemia    PLAN:    In order of problems listed above:  1. Patient with chest pain, with some typical features.  Has risk factors of age, hypertension, hyperlipidemia.  Echocardiogram showed normal systolic function, impaired relaxation, EF 60 to 65%.  Lexiscan Myoview with no evidence for ischemia, low risk study.  Patient made aware of results.  GI/asthma likely contributing to symptoms. 2. History of hypertension, BP controlled.  Continue Toprol XL 3. History of hyperlipidemia, continue Crestor   Follow up as needed  Total encounter time 35 minutes  Greater than 50% was spent in counseling and coordination of care with the patient    Medication Adjustments/Labs and Tests Ordered: Current medicines are reviewed at length with the patient today.  Concerns regarding medicines are outlined above.  No orders of the defined types were placed in this encounter.  No orders of the defined types were placed in this encounter.   Patient Instructions  Medication  Instructions:  - Your physician recommends that you continue on your current medications as directed. Please refer to the Current Medication list given to you today.  *If you need a refill on your cardiac medications before your next appointment, please call your pharmacy*   Lab Work: - none ordered  If you have labs (blood work) drawn today and your tests are completely normal, you will receive your results only by: Marland Kitchen MyChart Message (if you have MyChart) OR . A paper copy in the mail If you have any lab test that is abnormal or we need to change your treatment, we will call you to review the results.   Testing/Procedures: - none ordered   Follow-Up: At [facility], you and your health needs are our priority.  As part of our continuing mission to provide you with exceptional heart care, we have created designated Provider Care Teams.  These Care Teams include your primary Cardiologist (physician) and Advanced Practice Providers (APPs -  Physician Assistants and Nurse Practitioners) who all work together to provide you with the care you need, when you need it.  We recommend signing up for the patient portal called "MyChart".  Sign up information is provided on this After Visit Summary.  MyChart is used to connect with patients for Virtual Visits (Telemedicine).  Patients are able to view lab/test results, encounter notes, upcoming appointments, etc.  Non-urgent messages can be sent to your provider as well.   To learn more about what you can do with MyChart, go to [email or web address].    Your next appointment:   As needed   The format for your next appointment:   In Person  Provider:   Kate Sable, MD   Other Instructions n/a     Signed, Kate Sable, MD  03/02/2020 10:04 AM    [place]

## 2020-03-02 NOTE — Patient Instructions (Signed)
Medication Instructions:  - Your physician recommends that you continue on your current medications as directed. Please refer to the Current Medication list given to you today.  *If you need a refill on your cardiac medications before your next appointment, please call your pharmacy*   Lab Work: - none ordered  If you have labs (blood work) drawn today and your tests are completely normal, you will receive your results only by: Marland Kitchen MyChart Message (if you have MyChart) OR . A paper copy in the mail If you have any lab test that is abnormal or we need to change your treatment, we will call you to review the results.   Testing/Procedures: - none ordered   Follow-Up: At [facility], you and your health needs are our priority.  As part of our continuing mission to provide you with exceptional heart care, we have created designated Provider Care Teams.  These Care Teams include your primary Cardiologist (physician) and Advanced Practice Providers (APPs -  Physician Assistants and Nurse Practitioners) who all work together to provide you with the care you need, when you need it.  We recommend signing up for the patient portal called "MyChart".  Sign up information is provided on this After Visit Summary.  MyChart is used to connect with patients for Virtual Visits (Telemedicine).  Patients are able to view lab/test results, encounter notes, upcoming appointments, etc.  Non-urgent messages can be sent to your provider as well.   To learn more about what you can do with MyChart, go to [email or web address].    Your next appointment:   As needed   The format for your next appointment:   In Person  Provider:   Kate Sable, MD   Other Instructions n/a

## 2020-03-03 ENCOUNTER — Ambulatory Visit: Payer: Medicare Other

## 2020-03-03 ENCOUNTER — Ambulatory Visit
Admission: RE | Admit: 2020-03-03 | Discharge: 2020-03-03 | Disposition: A | Discharge status: Home / Self Care | Payer: Medicare Other | Source: Ambulatory Visit | Attending: Internal Medicine | Admitting: Internal Medicine

## 2020-03-03 DIAGNOSIS — R0602 Shortness of breath: Secondary | ICD-10-CM | POA: Insufficient documentation

## 2020-03-03 DIAGNOSIS — Z1231 Encounter for screening mammogram for malignant neoplasm of breast: Secondary | ICD-10-CM

## 2020-03-03 MED ORDER — ALBUTEROL SULFATE (2.5 MG/3ML) 0.083% IN NEBU
2.5000 mg | INHALATION_SOLUTION | Freq: Once | RESPIRATORY_TRACT | Status: AC
  Administered 2020-03-03: 2.5 mg via RESPIRATORY_TRACT
  Filled 2020-03-03: qty 3

## 2020-03-04 LAB — PULMONARY FUNCTION TEST ARMC ONLY
DL/VA % pred: 92 %
DL/VA: 3.79 ml/min/mmHg/L
DLCO unc % pred: 87 %
DLCO unc: 16.58 ml/min/mmHg
FEF 25-75 Post: 0.57 L/sec
FEF 25-75 Pre: 0.87 L/sec
FEF2575-%Change-Post: -34 %
FEF2575-%Pred-Post: 37 %
FEF2575-%Pred-Pre: 57 %
FEV1-%Change-Post: -10 %
FEV1-%Pred-Post: 52 %
FEV1-%Pred-Pre: 58 %
FEV1-Post: 1.06 L
FEV1-Pre: 1.18 L
FEV1FVC-%Change-Post: 6 %
FEV1FVC-%Pred-Pre: 98 %
FEV6-%Change-Post: -15 %
FEV6-%Pred-Post: 53 %
FEV6-%Pred-Pre: 63 %
FEV6-Post: 1.36 L
FEV6-Pre: 1.61 L
FEV6FVC-%Pred-Post: 105 %
FEV6FVC-%Pred-Pre: 105 %
FVC-%Change-Post: -15 %
FVC-%Pred-Post: 50 %
FVC-%Pred-Pre: 59 %
FVC-Post: 1.36 L
Post FEV1/FVC ratio: 78 %
Post FEV6/FVC ratio: 100 %
Pre FEV1/FVC ratio: 73 %
Pre FEV6/FVC Ratio: 100 %
RV % pred: 94 %
RV: 2.23 L
TLC % pred: 87 %
TLC: 4.41 L

## 2020-03-23 ENCOUNTER — Other Ambulatory Visit: Payer: Self-pay

## 2020-03-23 ENCOUNTER — Ambulatory Visit
Admission: EM | Admit: 2020-03-23 | Discharge: 2020-03-23 | Disposition: A | Discharge status: Home / Self Care | Payer: Medicare Other | Attending: Family Medicine | Admitting: Family Medicine

## 2020-03-23 ENCOUNTER — Encounter: Payer: Self-pay | Admitting: Emergency Medicine

## 2020-03-23 DIAGNOSIS — J988 Other specified respiratory disorders: Secondary | ICD-10-CM

## 2020-03-23 MED ORDER — METHYLPREDNISOLONE SODIUM SUCC 40 MG IJ SOLR
80.0000 mg | Freq: Once | INTRAMUSCULAR | Status: AC
  Administered 2020-03-23: 80 mg via INTRAMUSCULAR

## 2020-03-23 MED ORDER — BENZONATATE 200 MG PO CAPS: 200.0000 mg | ORAL_CAPSULE | Freq: Three times a day (TID) | ORAL | 0 refills | Status: DC | PRN

## 2020-03-23 NOTE — ED Provider Notes (Signed)
[facility]    CSN: [id number] Arrival date & time: 03/23/20  1151      History   Chief Complaint Chief Complaint  Patient presents with  . Cough  . Nasal Congestion   HPI  79 year old female presents with the above complaints.   Patient reports a 2-day history of rhinorrhea and cough.  Her significant other is also sick.  No fever.  Declines Covid testing.  No relieving factors.  Patient states that she would like injections today as she does not "do well with medication".  She states that she has previously been given injections at current a walk-in clinic.  Past Medical History:  Diagnosis Date  . Fibrocystic breast disease   . Gastritis    EGD - schatzki's ring, gastritis and gastric polyps  . History of colon polyps   . Hypercholesterolemia   . Hypertension   . MAI (mycobacterium avium-intracellulare) infection ([place])   . Osteoporosis   . Reflux esophagitis     Patient Active Problem List   Diagnosis Date Noted  . Chest pain 12/14/2019  . SOB (shortness of breath) 12/14/2019  . Nocturia 12/11/2019  . Sleeping difficulty 09/08/2018  . Hyperglycemia 05/06/2018  . Cough 10/10/2017  . Left hip pain 07/30/2017  . Vaginal pain 04/09/2017  . Chest tightness 04/02/2016  . Axillary pain 03/22/2016  . Meningioma ([place]) 02/21/2015  . Dizziness 10/18/2014  . Health care maintenance 10/18/2014  . Dysphagia 03/23/2014  . GERD (gastroesophageal reflux disease) 03/23/2014  . Headache 10/05/2013  . URI (upper respiratory infection) 10/05/2013  . Urinary frequency 09/30/2013  . Left ear pain 07/20/2013  . Neck pain 03/31/2013  . MAI (mycobacterium avium-intracellulare) infection ([place]) 08/31/2012  . Essential hypertension, benign 08/31/2012  . Hypercholesterolemia 08/31/2012  . History of colonic polyps 08/31/2012  . Gastritis 08/31/2012    Past Surgical History:  Procedure Laterality Date  . ABDOMINAL HYSTERECTOMY  1998  . BREAST EXCISIONAL BIOPSY  Bilateral 1970's   benign  . BREAST EXCISIONAL BIOPSY Bilateral 2010   pt  stated dr Tamala Julian did bilat axilla lumps neg   . BREAST SURGERY     cyst(benign)  . COLONOSCOPY N/A 09/28/2014   Procedure: COLONOSCOPY;  Surgeon: Manya Silvas, MD;  Location: [facility] ENDOSCOPY;  Service: Endoscopy;  Laterality: N/A;  . ESOPHAGOGASTRODUODENOSCOPY N/A 09/28/2014   Procedure: ESOPHAGOGASTRODUODENOSCOPY (EGD);  Surgeon: Manya Silvas, MD;  Location: [facility] ENDOSCOPY;  Service: Endoscopy;  Laterality: N/A;  . ESOPHAGOGASTRODUODENOSCOPY (EGD) WITH PROPOFOL N/A 04/26/2016   Procedure: ESOPHAGOGASTRODUODENOSCOPY (EGD) WITH PROPOFOL;  Surgeon: Manya Silvas, MD;  Location: [facility] ENDOSCOPY;  Service: Endoscopy;  Laterality: N/A;  . excision of right axillary lipoma  10/07  . FOOT SURGERY Right   . HEMORRHOID SURGERY    . HERNIA REPAIR  10/07  . NASAL SEPTUM SURGERY      OB History   No obstetric history on file.      Home Medications    Prior to Admission medications   Medication Sig Start Date End Date Taking? Authorizing Provider  aspirin (ASPIRIN EC) 81 MG EC tablet Take 81 mg by mouth daily. Swallow whole.   Yes [provider]  Fluticasone-Umeclidin-Vilant (TRELEGY ELLIPTA) 100-62.5-25 MCG/INH AEPB Inhale 1 puff into the lungs daily. 02/02/20  Yes Tyler Pita, MD  metoprolol succinate (TOPROL-XL) 25 MG 24 hr tablet TAKE 1 TABLET TWICE A DAY 02/25/20  Yes Einar Pheasant, MD  montelukast (SINGULAIR) 10 MG tablet TAKE 1 TABLET BY MOUTH EVERYDAY  AT BEDTIME 02/20/20  Yes Einar Pheasant, MD  Multiple Vitamin (MULTIVITAMIN WITH MINERALS) TABS Take 1 tablet by mouth daily.   Yes [provider]  omeprazole (PRILOSEC) 20 MG capsule Take 1 capsule (20 mg total) by mouth 2 (two) times daily. 09/05/16  Yes Einar Pheasant, MD  rosuvastatin (CRESTOR) 10 MG tablet TAKE 1 TABLET DAILY 11/07/19  Yes Einar Pheasant, MD  benzonatate (TESSALON) 200 MG capsule Take 1 capsule (200 mg total)  by mouth 3 (three) times daily as needed for cough. 03/23/20   Coral Spikes, DO  Omega-3 Fatty Acids (FISH OIL) 1000 MG CAPS Take 3,000 mg by mouth daily. 12/29/08   [provider]    Family History Family History  Problem Relation Age of Onset  . Heart disease Mother 77       heart attack  . Cancer Father        lung  . Stroke Brother   . Diabetes Brother   . Diabetes Sister   . Breast cancer Sister 65    Social History Social History   Tobacco Use  . Smoking status: Never Smoker  . Smokeless tobacco: Never Used  Vaping Use  . Vaping Use: Never used  Substance Use Topics  . Alcohol use: No    Alcohol/week: 0.0 standard drinks  . Drug use: No     Allergies   Patient has no known allergies.   Review of Systems Review of Systems  HENT: Positive for rhinorrhea.   Respiratory: Positive for cough.    Physical Exam Triage Vital Signs ED Triage Vitals  Enc Vitals Group     BP 03/23/20 1246 (!) 141/60     Pulse Rate 03/23/20 1246 71     Resp 03/23/20 1246 18     Temp 03/23/20 1246 98 F (36.7 C)     Temp Source 03/23/20 1246 Oral     SpO2 03/23/20 1246 99 %     Weight 03/23/20 1244 180 lb (81.6 kg)     Height 03/23/20 1244 5\' 4"  (1.626 m)     Head Circumference --      Peak Flow --      Pain Score 03/23/20 1244 0     Pain Loc --      Pain Edu? --      Excl. in [place]? --    Updated Vital Signs BP (!) 141/60 (BP Location: Right Arm)   Pulse 71   Temp 98 F (36.7 C) (Oral)   Resp 18   Ht 5\' 4"  (1.626 m)   Wt 81.6 kg   SpO2 99%   BMI 30.90 kg/m   Visual Acuity Right Eye Distance:   Left Eye Distance:   Bilateral Distance:    Right Eye Near:   Left Eye Near:    Bilateral Near:     Physical Exam Vitals and nursing note reviewed.  Constitutional:      General: She is not in acute distress.    Appearance: Normal appearance. She is not ill-appearing.  HENT:     Head: Normocephalic and atraumatic.  Eyes:     General:        Right eye: No  discharge.        Left eye: No discharge.     Conjunctiva/sclera: Conjunctivae normal.  Cardiovascular:     Rate and Rhythm: Normal rate and regular rhythm.     Heart sounds: No murmur heard.   Pulmonary:     Effort: Pulmonary effort  is normal.     Breath sounds: Normal breath sounds. No wheezing or rales.  Neurological:     Mental Status: She is alert.  Psychiatric:        Mood and Affect: Mood normal.        Behavior: Behavior normal.    UC Treatments / Results  Labs (all labs ordered are listed, but only abnormal results are displayed) Labs Reviewed - No data to display  EKG   Radiology No results found.  Procedures Procedures (including critical care time)  Medications Ordered in UC Medications  methylPREDNISolone sodium succinate (SOLU-MEDROL) 40 mg/mL injection 80 mg (80 mg Intramuscular Given 03/23/20 1310)    Initial Impression / Assessment and Plan / UC Course  I have reviewed the triage vital signs and the nursing notes.  Pertinent labs & imaging results that were available during my care of the patient were reviewed by me and considered in my medical decision making (see chart for details).    79 year old female presents with respiratory infection.  Patient has underlying asthma which is contributing as well.  Tessalon Perles for cough.  IM Solu-Medrol given today.  Final Clinical Impressions(s) / UC Diagnoses   Final diagnoses:  Respiratory infection     Discharge Instructions     Injection given today. This should help cough and SOB (this is likely related to your asthma).  Cough medication as directed.  Take care  Dr. Lacinda Axon    ED Prescriptions    Medication Sig Dispense Auth. Provider   benzonatate (TESSALON) 200 MG capsule Take 1 capsule (200 mg total) by mouth 3 (three) times daily as needed for cough. 30 capsule Coral Spikes, DO     PDMP not reviewed this encounter.   Coral Spikes, DO 03/23/20 1401

## 2020-03-23 NOTE — ED Triage Notes (Addendum)
Patient c/o cough and congestion that started 2 days ago. Denies fever. Patient declines COVID testing.

## 2020-03-23 NOTE — Discharge Instructions (Signed)
Injection given today. This should help cough and SOB (this is likely related to your asthma).  Cough medication as directed.  Take care  Dr. Lacinda Axon

## 2020-04-03 DIAGNOSIS — B9689 Other specified bacterial agents as the cause of diseases classified elsewhere: Secondary | ICD-10-CM | POA: Diagnosis not present

## 2020-04-03 DIAGNOSIS — J019 Acute sinusitis, unspecified: Secondary | ICD-10-CM | POA: Diagnosis not present

## 2020-04-22 ENCOUNTER — Ambulatory Visit (INDEPENDENT_AMBULATORY_CARE_PROVIDER_SITE_OTHER): Payer: Medicare Other | Admitting: Pulmonary Disease

## 2020-04-22 ENCOUNTER — Encounter: Payer: Self-pay | Admitting: Pulmonary Disease

## 2020-04-22 ENCOUNTER — Other Ambulatory Visit: Payer: Self-pay

## 2020-04-22 VITALS — BP 134/70 | HR 80 | Temp 97.5°F | Ht 64.0 in | Wt 198.4 lb

## 2020-04-22 DIAGNOSIS — R0602 Shortness of breath: Secondary | ICD-10-CM | POA: Diagnosis not present

## 2020-04-22 DIAGNOSIS — Z23 Encounter for immunization: Secondary | ICD-10-CM | POA: Diagnosis not present

## 2020-04-22 DIAGNOSIS — J4540 Moderate persistent asthma, uncomplicated: Secondary | ICD-10-CM | POA: Diagnosis not present

## 2020-04-22 MED ORDER — TRELEGY ELLIPTA 100-62.5-25 MCG/INH IN AEPB: 1.0000 | INHALATION_SPRAY | Freq: Every day | RESPIRATORY_TRACT | 0 refills | Status: AC

## 2020-04-22 NOTE — Progress Notes (Signed)
Subjective:    [name], female    DOB: 09-19-1940, 79 y.o.   MRN: [id number]  HPI Angela Cohen is a 79 year old lifelong never smoker who presents for follow-up on the issue of moderate persistent asthma.  She was initially evaluated here on 02/02/2020.  Prior to that she had been evaluated by Angela Cohen at [facility] pulmonology and given the diagnosis of asthma.  The patient however at the time of her initial visit here had not been on maintenance medications.  We started Trelegy Ellipta which the patient has found to be very very beneficial and she is also on montelukast which she feels helps as well.  Since starting Trelegy, no cough or sputum production.  Voices no other complaint.  Overall she looks well and feels well.  Laboratory data obtained:  2D echo on 02/24/2020: LVEF 60 to 65%, no wall motion abnormalities, grade 1 DD, normal pulmonary artery pressure.  No valve disease noted. Pulmonary function testing 03/03/2020: Small airways obstructive disease with restrictive physiology likely due to obesity.  Diffusion capacity normal.   Review of Systems A 10 point review of systems was performed and it is as noted above otherwise negative.   Patient Active Problem List   Diagnosis Date Noted  . Chest pain 12/14/2019  . SOB (shortness of breath) 12/14/2019  . Nocturia 12/11/2019  . Sleeping difficulty 09/08/2018  . Hyperglycemia 05/06/2018  . Cough 10/10/2017  . Left hip pain 07/30/2017  . Vaginal pain 04/09/2017  . Chest tightness 04/02/2016  . Axillary pain 03/22/2016  . Meningioma ([place]) 02/21/2015  . Dizziness 10/18/2014  . Health care maintenance 10/18/2014  . Dysphagia 03/23/2014  . GERD (gastroesophageal reflux disease) 03/23/2014  . Headache 10/05/2013  . URI (upper respiratory infection) 10/05/2013  . Urinary frequency 09/30/2013  . Left ear pain 07/20/2013  . Neck pain 03/31/2013  . MAI (mycobacterium avium-intracellulare) infection ([place])  08/31/2012  . Essential hypertension, benign 08/31/2012  . Hypercholesterolemia 08/31/2012  . History of colonic polyps 08/31/2012  . Gastritis 08/31/2012    No Known Allergies  Current Meds  Medication Sig  . aspirin 81 MG EC tablet Take 81 mg by mouth daily. Swallow whole.  . benzonatate (TESSALON) 200 MG capsule Take 1 capsule (200 mg total) by mouth 3 (three) times daily as needed for cough.  . Fluticasone-Umeclidin-Vilant (TRELEGY ELLIPTA) 100-62.5-25 MCG/INH AEPB Inhale 1 puff into the lungs daily.  . metoprolol succinate (TOPROL-XL) 25 MG 24 hr tablet TAKE 1 TABLET TWICE A DAY  . montelukast (SINGULAIR) 10 MG tablet TAKE 1 TABLET BY MOUTH EVERYDAY AT BEDTIME  . Multiple Vitamin (MULTIVITAMIN WITH MINERALS) TABS Take 1 tablet by mouth daily.  . Omega-3 Fatty Acids (FISH OIL) 1000 MG CAPS Take 3,000 mg by mouth daily.  Marland Kitchen omeprazole (PRILOSEC) 20 MG capsule Take 1 capsule (20 mg total) by mouth 2 (two) times daily.  . rosuvastatin (CRESTOR) 10 MG tablet TAKE 1 TABLET DAILY   Immunization History  Administered Date(s) Administered  . Influenza Split 02/07/2013, 03/12/2014  . Influenza-Unspecified 03/12/2014, 03/11/2015  . PFIZER SARS-COV-2 Vaccination 05/22/2019, 06/12/2019  . Pneumococcal Conjugate-13 02/17/2015       Objective:   Physical Exam BP 134/70   Pulse 80   Temp (!) 97.5 F (36.4 C) (Temporal)   Ht 5\' 4"  (1.626 m)   Wt 198 lb 6.4 oz (90 kg)   SpO2 96%   BMI 34.06 kg/m  GENERAL: Well-developed, overweight woman in no acute distress.  Fully ambulatory.   HEAD: Normocephalic, atraumatic.  EYES: Pupils equal, round, reactive to light.  No scleral icterus.  MOUTH: Nose/mouth/throat not examined due to masking requirements for COVID 19.   NECK: Supple. No thyromegaly. Trachea midline. No JVD.  No adenopathy. PULMONARY: Good air entry bilaterally.    Coarse breath sounds but otherwise, no adventitious sounds. CARDIOVASCULAR: S1 and S2. Regular rate and rhythm.   No rubs, murmurs or gallops heard. ABDOMEN: Benign MUSCULOSKELETAL: He has significant OA changes in her hands, no clubbing, no edema.  NEUROLOGIC: No focal deficits noted.  Fluent speech, no gait disturbance. SKIN: Intact,warm,dry.  Limited exam shows no rashes. PSYCH: Mood and behavior are appropriate.     Assessment & Plan:     ICD-10-CM   1. Moderate persistent asthma, unspecified whether complicated  V48.62    PFTs consistent with airway remodeling Continue Trelegy Ellipta Follow-up in 6 months call sooner should any new problems arise  2. SOB (shortness of breath)  R06.02    Markedly improved/resolved with Trelegy   Meds ordered this encounter  Medications  . Fluticasone-Umeclidin-Vilant (TRELEGY ELLIPTA) 100-62.5-25 MCG/INH AEPB    Sig: Inhale 1 puff into the lungs daily for 1 day.    Dispense:  14 each    Refill:  0    Order Specific Question:   Lot Number?    Answer:   [id number]    Order Specific Question:   Expiration Date?    Answer:   10/13/2021    Order Specific Question:   Quantity    Answer:   1   C. Derrill Kay, MD [facility]   *This note was dictated using voice recognition software/Dragon.  Despite best efforts to proofread, errors can occur which can change the meaning.  Any change was purely unintentional.

## 2020-04-22 NOTE — Patient Instructions (Signed)
Continue Trelegy 100/62.5/25, 1 inhalation daily.  Rinse your mouth well after use it.  We will see you back in 6 months time call sooner should any new problems arise.

## 2020-05-03 ENCOUNTER — Other Ambulatory Visit (INDEPENDENT_AMBULATORY_CARE_PROVIDER_SITE_OTHER): Payer: Medicare Other

## 2020-05-03 ENCOUNTER — Other Ambulatory Visit: Payer: Self-pay

## 2020-05-03 DIAGNOSIS — E7800 Pure hypercholesterolemia, unspecified: Secondary | ICD-10-CM | POA: Diagnosis not present

## 2020-05-03 DIAGNOSIS — I10 Essential (primary) hypertension: Secondary | ICD-10-CM | POA: Diagnosis not present

## 2020-05-03 DIAGNOSIS — R739 Hyperglycemia, unspecified: Secondary | ICD-10-CM | POA: Diagnosis not present

## 2020-05-03 LAB — LIPID PANEL
Cholesterol: 159 mg/dL (ref 0–200)
HDL: 82.2 mg/dL (ref >39.00)
LDL Cholesterol: 58 mg/dL (ref 0–99)
NonHDL: 76.88
Total CHOL/HDL Ratio: 2
Triglycerides: 92 mg/dL (ref 0.0–149.0)
VLDL: 18.4 mg/dL (ref 0.0–40.0)

## 2020-05-03 LAB — HEMOGLOBIN A1C: Hgb A1c MFr Bld: 6.3 % (ref 4.6–6.5)

## 2020-05-03 LAB — BASIC METABOLIC PANEL
BUN: 12 mg/dL (ref 6–23)
CO2: 31 mEq/L (ref 19–32)
Calcium: 8.7 mg/dL (ref 8.4–10.5)
Chloride: 105 mEq/L (ref 96–112)
Creatinine, Ser: 0.76 mg/dL (ref 0.40–1.20)
GFR: 75.09 mL/min (ref >60.00)
Glucose, Bld: 105 mg/dL — ABNORMAL HIGH (ref 70–99)
Potassium: 4.5 mEq/L (ref 3.5–5.1)
Sodium: 141 mEq/L (ref 135–145)

## 2020-05-03 LAB — HEPATIC FUNCTION PANEL
ALT: 26 U/L (ref 0–35)
AST: 21 U/L (ref 0–37)
Albumin: 4.2 g/dL (ref 3.5–5.2)
Alkaline Phosphatase: 77 U/L (ref 39–117)
Bilirubin, Direct: 0.1 mg/dL (ref 0.0–0.3)
Total Bilirubin: 0.5 mg/dL (ref 0.2–1.2)
Total Protein: 6.9 g/dL (ref 6.0–8.3)

## 2020-05-06 ENCOUNTER — Ambulatory Visit (INDEPENDENT_AMBULATORY_CARE_PROVIDER_SITE_OTHER): Payer: Medicare Other | Admitting: Internal Medicine

## 2020-05-06 ENCOUNTER — Other Ambulatory Visit: Payer: Self-pay

## 2020-05-06 ENCOUNTER — Encounter: Payer: Self-pay | Admitting: Internal Medicine

## 2020-05-06 VITALS — BP 124/80 | HR 85 | Temp 98.1°F | Resp 16 | Ht 64.0 in | Wt 198.0 lb

## 2020-05-06 DIAGNOSIS — R0602 Shortness of breath: Secondary | ICD-10-CM

## 2020-05-06 DIAGNOSIS — Z23 Encounter for immunization: Secondary | ICD-10-CM

## 2020-05-06 DIAGNOSIS — E7800 Pure hypercholesterolemia, unspecified: Secondary | ICD-10-CM

## 2020-05-06 DIAGNOSIS — K219 Gastro-esophageal reflux disease without esophagitis: Secondary | ICD-10-CM

## 2020-05-06 DIAGNOSIS — R32 Unspecified urinary incontinence: Secondary | ICD-10-CM

## 2020-05-06 DIAGNOSIS — I10 Essential (primary) hypertension: Secondary | ICD-10-CM

## 2020-05-06 DIAGNOSIS — Z0000 Encounter for general adult medical examination without abnormal findings: Secondary | ICD-10-CM

## 2020-05-06 DIAGNOSIS — R739 Hyperglycemia, unspecified: Secondary | ICD-10-CM

## 2020-05-06 NOTE — Assessment & Plan Note (Signed)
Physical today 04/16/20.  Mammogram 03/05/20 - Birads I.  Colonoscopy 09/2014 - internal hemorrhodis.  Recommended f/u in 5 years.

## 2020-05-06 NOTE — Progress Notes (Signed)
[name], female   DOB: 03/07/1941, 79 y.o.   MRN: [id number]   Subjective:    [name], female    DOB: [birth year], 79 y.o.   MRN: [id number]  HPI This visit occurred during the SARS-CoV-2 public health emergency.  Safety protocols were in place, including screening questions prior to the visit, additional usage of staff PPE, and extensive cleaning of exam room while observing appropriate contact time as indicated for disinfecting solutions.  Patient here for her physical exam.  Has a history of asthma.  Seeing pulmonary.  Using trelegy and taking singulair.  Breathing much improved.  No increased cough, congestion or sob.  Saw cardiology.  EF 60-65%, grade I diastolic dysfunction, normal PAP.  She feels better.  Eating.  No nausea or vomiting.  Bowels moving.  Does report increased bladder leakage.  S/p bladder surgery previously - Dr Achilles Dunk.  Persistent problems.  Discussed referral back to urology.  Discussed labs.  Discussed pneumovax.  Discussed diet and exercise.     Past Medical History:  Diagnosis Date  . Fibrocystic breast disease   . Gastritis    EGD - schatzki's ring, gastritis and gastric polyps  . History of colon polyps   . Hypercholesterolemia   . Hypertension   . MAI (mycobacterium avium-intracellulare) infection ([facility])   . Osteoporosis   . Reflux esophagitis    Past Surgical History:  Procedure Laterality Date  . ABDOMINAL HYSTERECTOMY  1998  . BREAST EXCISIONAL BIOPSY Bilateral 1970's   benign  . BREAST EXCISIONAL BIOPSY Bilateral 2010   pt  stated dr Katrinka Blazing did bilat axilla lumps neg   . BREAST SURGERY     cyst(benign)  . COLONOSCOPY N/A 09/28/2014   Procedure: COLONOSCOPY;  Surgeon: Scot Jun, MD;  Location[facility] ENDOSCOPY;  Service: Endoscopy;  Laterality: N/A;  . ESOPHAGOGASTRODUODENOSCOPY N/A 09/28/2014   Procedure: ESOPHAGOGASTRODUODENOSCOPY (EGD);  Surgeon: Scot Jun, MD;  Location: [facility] ENDOSCOPY;  Service:  Endoscopy;  Laterality: N/A;  . ESOPHAGOGASTRODUODENOSCOPY (EGD) WITH PROPOFOL N/A 04/26/2016   Procedure: ESOPHAGOGASTRODUODENOSCOPY (EGD) WITH PROPOFOL;  Surgeon: Scot Jun, MD;  Location: [facility] ENDOSCOPY;  Service: Endoscopy;  Laterality: N/A;  . excision of right axillary lipoma  10/07  . FOOT SURGERY Right   . HEMORRHOID SURGERY    . HERNIA REPAIR  10/07  . NASAL SEPTUM SURGERY     Family History  Problem Relation Age of Onset  . Heart disease Mother 63       heart attack  . Cancer Father        lung  . Stroke Brother   . Diabetes Brother   . Diabetes Sister   . Breast cancer Sister 36   Social History   Socioeconomic History  . Marital status: Widowed    Spouse name: Not on file  . Number of children: Not on file  . Years of education: Not on file  . Highest education level: Not on file  Occupational History  . Not on file  Tobacco Use  . Smoking status: Never Smoker  . Smokeless tobacco: Never Used  Vaping Use  . Vaping Use: Never used  Substance and Sexual Activity  . Alcohol use: No    Alcohol/week: 0.0 standard drinks  . Drug use: No  . Sexual activity: Not on file  Other Topics Concern  . Not on file  Social History Narrative  . Not on file   Social Determinants of Health   Financial  Resource Strain: Not on file  Food Insecurity: Not on file  Transportation Needs: Not on file  Physical Activity: Not on file  Stress: Not on file  Social Connections: Not on file    Outpatient Encounter Medications as of 05/06/2020  Medication Sig  . aspirin 81 MG EC tablet Take 81 mg by mouth daily. Swallow whole.  . benzonatate (TESSALON) 200 MG capsule Take 1 capsule (200 mg total) by mouth 3 (three) times daily as needed for cough.  . Fluticasone-Umeclidin-Vilant (TRELEGY ELLIPTA) 100-62.5-25 MCG/INH AEPB Inhale 1 puff into the lungs daily.  . metoprolol succinate (TOPROL-XL) 25 MG 24 hr tablet TAKE 1 TABLET TWICE A DAY  . montelukast (SINGULAIR) 10 MG  tablet TAKE 1 TABLET BY MOUTH EVERYDAY AT BEDTIME  . Multiple Vitamin (MULTIVITAMIN WITH MINERALS) TABS Take 1 tablet by mouth daily.  . Omega-3 Fatty Acids (FISH OIL) 1000 MG CAPS Take 3,000 mg by mouth daily.  . rosuvastatin (CRESTOR) 10 MG tablet TAKE 1 TABLET DAILY  . [DISCONTINUED] omeprazole (PRILOSEC) 20 MG capsule Take 1 capsule (20 mg total) by mouth 2 (two) times daily.   No facility-administered encounter medications on file as of 05/06/2020.    Review of Systems  Constitutional: Negative for appetite change and unexpected weight change.  HENT: Negative for congestion, sinus pressure and sore throat.   Eyes: Negative for pain and visual disturbance.  Respiratory: Negative for cough, chest tightness and shortness of breath.   Cardiovascular: Negative for chest pain, palpitations and leg swelling.  Gastrointestinal: Negative for abdominal pain, diarrhea, nausea and vomiting.  Genitourinary: Negative for difficulty urinating and dysuria.  Musculoskeletal: Negative for joint swelling and myalgias.  Skin: Negative for color change and rash.  Neurological: Negative for dizziness, light-headedness and headaches.  Hematological: Negative for adenopathy. Does not bruise/bleed easily.  Psychiatric/Behavioral: Negative for agitation and dysphoric mood.       Objective:    Physical Exam Vitals reviewed.  Constitutional:      General: She is not in acute distress.    Appearance: Normal appearance. She is well-developed and well-nourished.  HENT:     Head: Normocephalic and atraumatic.     Right Ear: External ear normal.     Left Ear: External ear normal.     Mouth/Throat:     Mouth: Oropharynx is clear and moist.  Eyes:     General: No scleral icterus.       Right eye: No discharge.        Left eye: No discharge.     Conjunctiva/sclera: Conjunctivae normal.  Neck:     Thyroid: No thyromegaly.  Cardiovascular:     Rate and Rhythm: Normal rate and regular rhythm.   Pulmonary:     Effort: No tachypnea, accessory muscle usage or respiratory distress.     Breath sounds: Normal breath sounds. No decreased breath sounds or wheezing.  Chest:  Breasts:     Right: No inverted nipple, mass, nipple discharge or tenderness (no axillary adenopathy).     Left: No inverted nipple, mass, nipple discharge or tenderness (no axilarry adenopathy).    Abdominal:     General: Bowel sounds are normal.     Palpations: Abdomen is soft.     Tenderness: There is no abdominal tenderness.  Musculoskeletal:        General: No swelling, tenderness or edema.     Cervical back: Neck supple. No tenderness.  Lymphadenopathy:     Cervical: No cervical adenopathy.  Skin:  Findings: No erythema or rash.  Neurological:     Mental Status: She is alert and oriented to person, place, and time.  Psychiatric:        Mood and Affect: Mood and affect and mood normal.        Behavior: Behavior normal.     BP 124/80   Pulse 85   Temp 98.1 F (36.7 C) (Oral)   Resp 16   Ht $R'5\' 4"'LB$  (1.626 m)   Wt 198 lb (89.8 kg)   SpO2 98%   BMI 33.99 kg/m  Wt Readings from Last 3 Encounters:  05/06/20 198 lb (89.8 kg)  04/22/20 198 lb 6.4 oz (90 kg)  03/23/20 180 lb (81.6 kg)     Lab Results  Component Value Date   WBC 5.2 12/11/2019   HGB 14.8 12/11/2019   HCT 43.7 12/11/2019   PLT 231.0 12/11/2019   GLUCOSE 105 (H) 05/03/2020   CHOL 159 05/03/2020   TRIG 92.0 05/03/2020   HDL 82.20 05/03/2020   LDLCALC 58 05/03/2020   ALT 26 05/03/2020   AST 21 05/03/2020   NA 141 05/03/2020   K 4.5 05/03/2020   CL 105 05/03/2020   CREATININE 0.76 05/03/2020   BUN 12 05/03/2020   CO2 31 05/03/2020   TSH 1.68 12/11/2019   HGBA1C 6.3 05/03/2020       Assessment & Plan:   Problem List Items Addressed This Visit    Essential hypertension, benign    Blood pressure doing well on metoprolol.  Follow pressures.  Follow metabolic panel.       Relevant Orders   Basic metabolic panel    Hypercholesterolemia    On crestor.  Low cholesterol diet and exercise.  Follow lipid panel and liver function tests.        Relevant Orders   Lipid panel   Hepatic function panel   GERD (gastroesophageal reflux disease)    No upper symptoms reported.  On omeprazole.       Health care maintenance    Physical today 04/16/20.  Mammogram 03/05/20 - Birads I.  Colonoscopy 09/2014 - internal hemorrhodis.  Recommended f/u in 5 years.        Hyperglycemia    Low carb diet and exercise.  Discussed diet - [facility] Lipid diet.  Follow met b and a1c       Relevant Orders   Hemoglobin A1c   SOB (shortness of breath)    Has asthma.  Seeing pulmonary. On trelegy and singulair.  Breathing much improved.  Follow.        Incontinence    Describes bladder leakage. Previous surgery as outlined.  Request f/u with urology.         Other Visit Diagnoses    Need for 23-polyvalent pneumococcal polysaccharide vaccine    -  Primary   Relevant Orders   Pneumococcal polysaccharide vaccine 23-valent greater than or equal to 2yo subcutaneous/IM (Completed)       Einar Pheasant, MD

## 2020-05-11 ENCOUNTER — Other Ambulatory Visit: Payer: Self-pay | Admitting: Internal Medicine

## 2020-05-11 DIAGNOSIS — R32 Unspecified urinary incontinence: Secondary | ICD-10-CM

## 2020-05-11 NOTE — Telephone Encounter (Signed)
Pt needs a refill on esomeprazole (NEXIUM) 40 MG capsule sent to [facility]

## 2020-05-12 NOTE — Telephone Encounter (Signed)
Plase confirm with pt if taking or if plans to take nexium 40mg  q day.  If so, then need to make sure not taking omeprazole.  If so, then ok to send in rx for nexium 40mg  q day #90 with one refill.

## 2020-05-12 NOTE — Telephone Encounter (Signed)
Patient isn't taking either medication., she is taking Protonix 40 mg daily per patient, she also is wonder if she can get the referral [facility] that was discussed at appointment. Please advise.

## 2020-05-13 ENCOUNTER — Encounter: Payer: Self-pay | Admitting: Internal Medicine

## 2020-05-13 DIAGNOSIS — N3946 Mixed incontinence: Secondary | ICD-10-CM | POA: Insufficient documentation

## 2020-05-13 DIAGNOSIS — R32 Unspecified urinary incontinence: Secondary | ICD-10-CM | POA: Insufficient documentation

## 2020-05-13 NOTE — Assessment & Plan Note (Signed)
On crestor.  Low cholesterol diet and exercise.  Follow lipid panel and liver function tests.   

## 2020-05-13 NOTE — Assessment & Plan Note (Signed)
No upper symptoms reported.  On omeprazole.  

## 2020-05-13 NOTE — Assessment & Plan Note (Signed)
Low carb diet and exercise.  Discussed diet - [facility] Lipid diet.  Follow met b and a1c

## 2020-05-13 NOTE — Addendum Note (Signed)
Addended by: Tilford Pillar on: 05/13/2020 09:36 AM   Modules accepted: Orders

## 2020-05-13 NOTE — Assessment & Plan Note (Signed)
Describes bladder leakage. Previous surgery as outlined.  Request f/u with urology.

## 2020-05-13 NOTE — Assessment & Plan Note (Signed)
Blood pressure doing well on metoprolol.  Follow pressures.  Follow metabolic panel.  

## 2020-05-13 NOTE — Addendum Note (Signed)
Addended by: Charm Barges on: 05/13/2020 07:55 AM   Modules accepted: Orders

## 2020-05-13 NOTE — Telephone Encounter (Signed)
Patient informed and verbalized understanding.   Patient states she is not taking omeprazole or Nexium. She is taking Protonix 40 mg daily and would like a 90 day supply sent to [facility].   Patient states she will be out this week and wants this sent in ASAP.   Pended for your approval or denial.

## 2020-05-13 NOTE — Telephone Encounter (Signed)
Order has been placed for referral to urology.  Need to clarify PPI.  She states she is on protonix.  Phone note stated wanted nexium sent in.  Ok to send in nexium, but will need to stop protonix.

## 2020-05-13 NOTE — Assessment & Plan Note (Signed)
Has asthma.  Seeing pulmonary. On trelegy and singulair.  Breathing much improved.  Follow.

## 2020-05-16 MED ORDER — PANTOPRAZOLE SODIUM 40 MG PO TBEC: 40.0000 mg | DELAYED_RELEASE_TABLET | Freq: Every day | ORAL | 1 refills | Status: DC

## 2020-05-17 ENCOUNTER — Other Ambulatory Visit: Payer: Self-pay | Admitting: Internal Medicine

## 2020-05-18 ENCOUNTER — Other Ambulatory Visit: Payer: Self-pay | Admitting: Internal Medicine

## 2020-05-31 ENCOUNTER — Ambulatory Visit: Payer: Self-pay | Admitting: Urology

## 2020-06-16 ENCOUNTER — Other Ambulatory Visit: Payer: Self-pay | Admitting: *Deleted

## 2020-06-16 ENCOUNTER — Telehealth: Payer: Self-pay | Admitting: Internal Medicine

## 2020-06-16 MED ORDER — MONTELUKAST SODIUM 10 MG PO TABS
10.0000 mg | ORAL_TABLET | Freq: Every day | ORAL | 1 refills | Status: DC
Start: 2020-06-16 — End: 2020-06-16

## 2020-06-16 MED ORDER — MONTELUKAST SODIUM 10 MG PO TABS
10.0000 mg | ORAL_TABLET | Freq: Every day | ORAL | 1 refills | Status: DC
Start: 2020-06-16 — End: 2021-05-18

## 2020-06-16 NOTE — Telephone Encounter (Signed)
Patient is requesting a refill on her montelukast (SINGULAIR) 10 MG tablet.

## 2020-06-16 NOTE — Progress Notes (Unsigned)
Recent medication to correct pharmacy.

## 2020-07-05 ENCOUNTER — Ambulatory Visit: Payer: Self-pay | Admitting: Urology

## 2020-07-12 ENCOUNTER — Ambulatory Visit: Payer: Self-pay | Admitting: Urology

## 2020-08-02 ENCOUNTER — Encounter: Payer: Self-pay | Admitting: Urology

## 2020-08-02 ENCOUNTER — Ambulatory Visit (INDEPENDENT_AMBULATORY_CARE_PROVIDER_SITE_OTHER): Payer: Medicare Other | Admitting: Urology

## 2020-08-02 ENCOUNTER — Other Ambulatory Visit: Payer: Self-pay

## 2020-08-02 ENCOUNTER — Ambulatory Visit: Payer: Self-pay | Admitting: Urology

## 2020-08-02 VITALS — BP 169/82 | HR 93 | Ht 64.0 in | Wt 187.0 lb

## 2020-08-02 DIAGNOSIS — R32 Unspecified urinary incontinence: Secondary | ICD-10-CM | POA: Diagnosis not present

## 2020-08-02 DIAGNOSIS — N3946 Mixed incontinence: Secondary | ICD-10-CM | POA: Diagnosis not present

## 2020-08-02 LAB — BLADDER SCAN AMB NON-IMAGING: Scan Result: 0

## 2020-08-02 NOTE — Progress Notes (Signed)
08/02/2020 8:45 AM   Angela Cohen November 14, 1940 [id number]  Referring provider: Einar Pheasant, [address] [place],  [place] [address]  No chief complaint on file.   HPI: I was consulted to assess the patient's urinary continence.  She has stress incontinence with coughing sneezing as well as urge incontinence.  Both are significant.  She soaks 4 pads a day and uses for heavy pads at night for bedwetting.  She voids every 30 to 60 minutes and cannot hold it for 2 hours.  She is up 3-4 times a night.  She has had a hysterectomy.  She is prone to constipation.  She has had a previous bladder suspension  She tried various medications in the past and possibly Ditropan.  She cannot remember Myrbetriq.  No neurologic issues.   PMH: Past Medical History:  Diagnosis Date  . Fibrocystic breast disease   . Gastritis    EGD - schatzki's ring, gastritis and gastric polyps  . History of colon polyps   . Hypercholesterolemia   . Hypertension   . MAI (mycobacterium avium-intracellulare) infection ([place])   . Osteoporosis   . Reflux esophagitis     Surgical History: Past Surgical History:  Procedure Laterality Date  . ABDOMINAL HYSTERECTOMY  1998  . BREAST EXCISIONAL BIOPSY Bilateral 1970's   benign  . BREAST EXCISIONAL BIOPSY Bilateral 2010   pt  stated dr Tamala Julian did bilat axilla lumps neg   . BREAST SURGERY     cyst(benign)  . COLONOSCOPY N/A 09/28/2014   Procedure: COLONOSCOPY;  Surgeon: Manya Silvas, MD;  Location: [facility] ENDOSCOPY;  Service: Endoscopy;  Laterality: N/A;  . ESOPHAGOGASTRODUODENOSCOPY N/A 09/28/2014   Procedure: ESOPHAGOGASTRODUODENOSCOPY (EGD);  Surgeon: Manya Silvas, MD;  Location: [facility] ENDOSCOPY;  Service: Endoscopy;  Laterality: N/A;  . ESOPHAGOGASTRODUODENOSCOPY (EGD) WITH PROPOFOL N/A 04/26/2016   Procedure: ESOPHAGOGASTRODUODENOSCOPY (EGD) WITH PROPOFOL;  Surgeon: Manya Silvas, MD;  Location: [facility] ENDOSCOPY;  Service:  Endoscopy;  Laterality: N/A;  . excision of right axillary lipoma  10/07  . FOOT SURGERY Right   . HEMORRHOID SURGERY    . HERNIA REPAIR  10/07  . NASAL SEPTUM SURGERY      Home Medications:  Allergies as of 08/02/2020   No Known Allergies     Medication List       Accurate as of August 02, 2020  8:45 AM. If you have any questions, ask your nurse or doctor.        aspirin 81 MG EC tablet Take 81 mg by mouth daily. Swallow whole.   benzonatate 200 MG capsule Commonly known as: TESSALON Take 1 capsule (200 mg total) by mouth 3 (three) times daily as needed for cough.   Fish Oil 1000 MG Caps Take 3,000 mg by mouth daily.   metoprolol succinate 25 MG 24 hr tablet Commonly known as: TOPROL-XL TAKE 1 TABLET TWICE A DAY   montelukast 10 MG tablet Commonly known as: SINGULAIR Take 1 tablet (10 mg total) by mouth at bedtime.   multivitamin with minerals Tabs tablet Take 1 tablet by mouth daily.   pantoprazole 40 MG tablet Commonly known as: PROTONIX Take 1 tablet (40 mg total) by mouth daily.   rosuvastatin 10 MG tablet Commonly known as: CRESTOR TAKE 1 TABLET DAILY   Trelegy Ellipta 100-62.5-25 MCG/INH Aepb Generic drug: Fluticasone-Umeclidin-Vilant Inhale 1 puff into the lungs daily.       Allergies: No Known Allergies  Family History: Family History  Problem Relation  Age of Onset  . Heart disease Mother 42       heart attack  . Cancer Father        lung  . Stroke Brother   . Diabetes Brother   . Diabetes Sister   . Breast cancer Sister 36    Social History:  reports that she has never smoked. She has never used smokeless tobacco. She reports that she does not drink alcohol and does not use drugs.  ROS:                                        Physical Exam: There were no vitals taken for this visit.  Constitutional:  Alert and oriented, No acute distress. HEENT: [place] AT, moist mucus membranes.  Trachea midline, no  masses. Cardiovascular: No clubbing, cyanosis, or edema. Respiratory: Normal respiratory effort, no increased work of breathing. GI: Abdomen is soft, nontender, nondistended, no abdominal masses GU: No CVA tenderness.  Ordered atrophy.  Urethra mildly distended at rest but no descensus.  Mild positive cough test that was reproducible. Skin: No rashes, bruises or suspicious lesions. Lymph: No cervical or inguinal adenopathy. Neurologic: Grossly intact, no focal deficits, moving all 4 extremities. Psychiatric: Normal mood and affect.  Laboratory Data: Lab Results  Component Value Date   WBC 5.2 12/11/2019   HGB 14.8 12/11/2019   HCT 43.7 12/11/2019   MCV 96.7 12/11/2019   PLT 231.0 12/11/2019    Lab Results  Component Value Date   CREATININE 0.76 05/03/2020    No results found for: PSA  No results found for: TESTOSTERONE  Lab Results  Component Value Date   HGBA1C 6.3 05/03/2020    Urinalysis    Component Value Date/Time   COLORURINE YELLOW 12/11/2019 1138   APPEARANCEUR CLEAR 12/11/2019 1138   LABSPEC 1.025 12/11/2019 1138   PHURINE 6.0 12/11/2019 1138   GLUCOSEU NEGATIVE 12/11/2019 1138   [place] 12/11/2019 1138   [place] 12/11/2019 1138   BILIRUBINUR negative 07/30/2017 1503   [place] 12/11/2019 1138   PROTEINUR negative 07/30/2017 1503   UROBILINOGEN 0.2 12/11/2019 1138   NITRITE NEGATIVE 12/11/2019 1138   LEUKOCYTESUR NEGATIVE 12/11/2019 1138    Pertinent Imaging: Urine reviewed.  Urine sent for culture.  Chart reviewed.  Assessment & Plan: Patient has mixed incontinence.  She has high-volume mixed incontinence and bedwetting.  Role of urodynamics and cystoscopy discussed.  Patient was actually leaking spontaneously when she was getting up for the pelvic semination.  She also has obvious stress incontinence and pelvic examination.  A bulking agent would be much more prudent than a sling.  She likely has a very overactive bladder  with high-volume urge incontinence and bedwetting.  Call if urine culture is positive  1. Urinary incontinence, unspecified type  - Urinalysis, Complete - BLADDER SCAN AMB NON-IMAGING   No follow-ups on file.  Reece Packer, MD  [place] [address], [place], [place] [address] [phone]

## 2020-08-02 NOTE — Patient Instructions (Signed)
Cystoscopy Cystoscopy is a procedure that is used to help diagnose and sometimes treat conditions that affect the lower urinary tract. The lower urinary tract includes the bladder and the urethra. The urethra is the tube that drains urine from the bladder. Cystoscopy is done using a thin, tube-shaped instrument with a light and camera at the end (cystoscope). The cystoscope may be hard or flexible, depending on the goal of the procedure. The cystoscope is inserted through the urethra, into the bladder. Cystoscopy may be recommended if you have:  Urinary tract infections that keep coming back.  Blood in the urine (hematuria).  An inability to control when you urinate (urinary incontinence) or an overactive bladder.  Unusual cells found in a urine sample.  A blockage in the urethra, such as a urinary stone.  Painful urination.  An abnormality in the bladder found during an intravenous pyelogram (IVP) or CT scan. Cystoscopy may also be done to remove a sample of tissue to be examined under a microscope (biopsy). What are the risks? Generally, this is a safe procedure. However, problems may occur, including:  Infection.  Bleeding.  What happens during the procedure?  1. You will be given one or more of the following: ? A medicine to numb the area (local anesthetic). 2. The area around the opening of your urethra will be cleaned. 3. The cystoscope will be passed through your urethra into your bladder. 4. Germ-free (sterile) fluid will flow through the cystoscope to fill your bladder. The fluid will stretch your bladder so that your health care provider can clearly examine your bladder walls. 5. Your doctor will look at the urethra and bladder. 6. The cystoscope will be removed The procedure may vary among health care providers  What can I expect after the procedure? After the procedure, it is common to have: 1. Some soreness or pain in your abdomen and urethra. 2. Urinary symptoms.  These include: ? Mild pain or burning when you urinate. Pain should stop within a few minutes after you urinate. This may last for up to 1 week. ? A small amount of blood in your urine for several days. ? Feeling like you need to urinate but producing only a small amount of urine. Follow these instructions at home: General instructions  Return to your normal activities as told by your health care provider.   Do not drive for 24 hours if you were given a sedative during your procedure.  Watch for any blood in your urine. If the amount of blood in your urine increases, call your health care provider.  If a tissue sample was removed for testing (biopsy) during your procedure, it is up to you to get your test results. Ask your health care provider, or the department that is doing the test, when your results will be ready.  Drink enough fluid to keep your urine pale yellow.  Keep all follow-up visits as told by your health care provider. This is important. Contact a health care provider if you:  Have pain that gets worse or does not get better with medicine, especially pain when you urinate.  Have trouble urinating.  Have more blood in your urine. Get help right away if you:  Have blood clots in your urine.  Have abdominal pain.  Have a fever or chills.  Are unable to urinate. Summary  Cystoscopy is a procedure that is used to help diagnose and sometimes treat conditions that affect the lower urinary tract.  Cystoscopy is done using   a thin, tube-shaped instrument with a light and camera at the end.  After the procedure, it is common to have some soreness or pain in your abdomen and urethra.  Watch for any blood in your urine. If the amount of blood in your urine increases, call your health care provider.  If you were prescribed an antibiotic medicine, take it as told by your health care provider. Do not stop taking the antibiotic even if you start to feel better. This  information is not intended to replace advice given to you by your health care provider. Make sure you discuss any questions you have with your health care provider. Document Revised: 04/23/2018 Document Reviewed: 04/23/2018 Elsevier Patient Education  2020 Elsevier Inc.   

## 2020-08-02 NOTE — Addendum Note (Signed)
Addended by: Alvera Novel on: 08/02/2020 09:15 AM   Modules accepted: Orders

## 2020-08-03 LAB — URINALYSIS, COMPLETE
Bilirubin, UA: NEGATIVE
Glucose, UA: NEGATIVE
Ketones, UA: NEGATIVE
Leukocytes,UA: NEGATIVE
Nitrite, UA: NEGATIVE
Protein,UA: NEGATIVE
Specific Gravity, UA: 1.025 (ref 1.005–1.030)
Urobilinogen, Ur: 0.2 mg/dL (ref 0.2–1.0)
pH, UA: 5.5 (ref 5.0–7.5)

## 2020-08-03 LAB — MICROSCOPIC EXAMINATION
Bacteria, UA: NONE SEEN
WBC, UA: NONE SEEN /hpf (ref 0–5)

## 2020-08-05 LAB — CULTURE, URINE COMPREHENSIVE

## 2020-08-20 ENCOUNTER — Telehealth: Payer: Self-pay | Admitting: Internal Medicine

## 2020-08-20 MED ORDER — METOPROLOL SUCCINATE ER 25 MG PO TB24
1.0000 | ORAL_TABLET | Freq: Two times a day (BID) | ORAL | 1 refills | Status: DC
Start: 2020-08-20 — End: 2021-02-28

## 2020-08-20 NOTE — Telephone Encounter (Signed)
Pt needs a refill on metoprolol succinate (TOPROL-XL) 25 MG 24 hr tablet sent to [facility]

## 2020-09-03 ENCOUNTER — Other Ambulatory Visit: Payer: Self-pay | Admitting: Urology

## 2020-09-06 ENCOUNTER — Ambulatory Visit: Payer: Medicare Other | Admitting: Urology

## 2020-09-06 ENCOUNTER — Other Ambulatory Visit: Payer: Self-pay | Admitting: Urology

## 2020-09-06 ENCOUNTER — Telehealth: Payer: Self-pay | Admitting: Internal Medicine

## 2020-09-06 NOTE — Telephone Encounter (Signed)
Patient would like to order Relief Factor through the TV. Patient would like to know if this would be ok to take with her current medications.

## 2020-09-07 NOTE — Telephone Encounter (Signed)
Patient would like to order relief factor but was unsure about taking it with her other medications. Wanted to get your advice regarding this?

## 2020-09-07 NOTE — Telephone Encounter (Signed)
Supplements do not have to be FDA approved.  In review of medical literature, I am not aware of any specific studies with these supplements.

## 2020-09-08 NOTE — Telephone Encounter (Signed)
Patient is gonna hold off on ordering

## 2020-09-13 ENCOUNTER — Encounter: Payer: Self-pay | Admitting: Urology

## 2020-09-13 ENCOUNTER — Other Ambulatory Visit: Payer: Self-pay

## 2020-09-13 ENCOUNTER — Ambulatory Visit (INDEPENDENT_AMBULATORY_CARE_PROVIDER_SITE_OTHER): Payer: Medicare Other | Admitting: Urology

## 2020-09-13 ENCOUNTER — Other Ambulatory Visit: Payer: Medicare Other

## 2020-09-13 VITALS — BP 159/83 | HR 94

## 2020-09-13 DIAGNOSIS — R32 Unspecified urinary incontinence: Secondary | ICD-10-CM | POA: Diagnosis not present

## 2020-09-13 DIAGNOSIS — N3946 Mixed incontinence: Secondary | ICD-10-CM | POA: Diagnosis not present

## 2020-09-13 NOTE — Progress Notes (Signed)
09/13/2020 9:12 AM   Angela Cohen Oct 20, 1940 [id number]  Referring provider: Einar Pheasant, [address] [place],  [place] [address]  Chief Complaint  Patient presents with  . Cysto    HPI: I was consulted to assess the patient's urinary continence.  She has stress incontinence with coughing sneezing as well as urge incontinence.  Both are significant.  She soaks 4 pads a day and uses for heavy pads at night for bedwetting.  She voids every 30 to 60 minutes and cannot hold it for 2 hours.  She is up 3-4 times a night.  She has had a hysterectomy.  She is prone to constipation.  She has had a previous bladder suspension  She tried various medications in the past and possibly Ditropan.  She cannot remember Myrbetriq.  Patient has mixed incontinence.  She has high-volume mixed incontinence and bedwetting.  Role of urodynamics and cystoscopy discussed.  Patient was actually leaking spontaneously when she was getting up for the pelvic examination.  She also has obvious stress incontinence and pelvic examination.  A bulking agent would be much more prudent than a sling.  She likely has a very overactive bladder with high-volume urge incontinence and bedwetting.  Call if urine culture is positive  Today Frequency stable.  Incontinence stable.  Last urine culture negative During urodynamics the patient was catheterized for 0 mL.  Maximum bladder capacity 399 mL.  Bladder was stable.  She had stress incontinence during the test but the leak point pressure if not registered.  Having said that it appears from the review of the flow line that was she was leaking a significant amount at approximately 70 cm of water in even as low as 20 cm of water during voluntary voiding she voided 128 mL with a maximal flow 5 mils per second.  Maximum voiding pressure 10 cm of water.  Residual was 294 mL.  EMG activity increased during the voiding phase.  Bladder neck descent at 1 to  2 cm.  The details of the urodynamics are signed dictated  On pelvic examination patient had grade 1 hypermobility.  Mild positive cough test after cystoscopy.  Moderate atrophy  Patient reports that she leaks a lot with coughing.  She also has high-volume bedwetting wears 3 pads a day at night.  Cystoscopy: Patient underwent flexible cystoscopy.  Bladder mucosa and trigone were normal.  No foreign body.  No carcinoma.  No mesh in bladder.     PMH: Past Medical History:  Diagnosis Date  . Fibrocystic breast disease   . Gastritis    EGD - schatzki's ring, gastritis and gastric polyps  . History of colon polyps   . Hypercholesterolemia   . Hypertension   . MAI (mycobacterium avium-intracellulare) infection ([place])   . Osteoporosis   . Reflux esophagitis     Surgical History: Past Surgical History:  Procedure Laterality Date  . ABDOMINAL HYSTERECTOMY  1998  . bladder tact    . BREAST EXCISIONAL BIOPSY Bilateral 1970's   benign  . BREAST EXCISIONAL BIOPSY Bilateral 2010   pt  stated dr Tamala Julian did bilat axilla lumps neg   . BREAST SURGERY     cyst(benign)  . COLONOSCOPY N/A 09/28/2014   Procedure: COLONOSCOPY;  Surgeon: Manya Silvas, MD;  Location: [facility] ENDOSCOPY;  Service: Endoscopy;  Laterality: N/A;  . ESOPHAGOGASTRODUODENOSCOPY N/A 09/28/2014   Procedure: ESOPHAGOGASTRODUODENOSCOPY (EGD);  Surgeon: Manya Silvas, MD;  Location: [facility] ENDOSCOPY;  Service: Endoscopy;  Laterality:  N/A;  . ESOPHAGOGASTRODUODENOSCOPY (EGD) WITH PROPOFOL N/A 04/26/2016   Procedure: ESOPHAGOGASTRODUODENOSCOPY (EGD) WITH PROPOFOL;  Surgeon: Manya Silvas, MD;  Location[facility] ENDOSCOPY;  Service: Endoscopy;  Laterality: N/A;  . excision of right axillary lipoma  10/07  . FOOT SURGERY Right   . HEMORRHOID SURGERY    . HERNIA REPAIR  10/07  . NASAL SEPTUM SURGERY      Home Medications:  Allergies as of 09/13/2020   No Known Allergies     Medication List       Accurate as of Sep 13, 2020   9:12 AM. If you have any questions, ask your nurse or doctor.        aspirin 81 MG EC tablet Take 81 mg by mouth daily. Swallow whole.   benzonatate 200 MG capsule Commonly known as: TESSALON Take 1 capsule (200 mg total) by mouth 3 (three) times daily as needed for cough.   Fish Oil 1000 MG Caps Take 3,000 mg by mouth daily.   metaxalone 800 MG tablet Commonly known as: SKELAXIN Take 1 tablet by mouth 3 (three) times daily.   metoprolol succinate 25 MG 24 hr tablet Commonly known as: TOPROL-XL Take 1 tablet (25 mg total) by mouth 2 (two) times daily.   montelukast 10 MG tablet Commonly known as: SINGULAIR Take 1 tablet (10 mg total) by mouth at bedtime.   multivitamin with minerals Tabs tablet Take 1 tablet by mouth daily.   pantoprazole 40 MG tablet Commonly known as: PROTONIX Take 1 tablet (40 mg total) by mouth daily.   rosuvastatin 10 MG tablet Commonly known as: CRESTOR TAKE 1 TABLET DAILY   Trelegy Ellipta 100-62.5-25 MCG/INH Aepb Generic drug: Fluticasone-Umeclidin-Vilant Inhale 1 puff into the lungs daily.       Allergies: No Known Allergies  Family History: Family History  Problem Relation Age of Onset  . Heart disease Mother 67       heart attack  . Cancer Father        lung  . Stroke Brother   . Diabetes Brother   . Diabetes Sister   . Breast cancer Sister 4    Social History:  reports that she has never smoked. She has never used smokeless tobacco. She reports that she does not drink alcohol and does not use drugs.  ROS:                                        Physical Exam: There were no vitals taken for this visit.  Constitutional:  Alert and oriented, No acute distress.  Laboratory Data: Lab Results  Component Value Date   WBC 5.2 12/11/2019   HGB 14.8 12/11/2019   HCT 43.7 12/11/2019   MCV 96.7 12/11/2019   PLT 231.0 12/11/2019    Lab Results  Component Value Date   CREATININE 0.76 05/03/2020     No results found for: PSA  No results found for: TESTOSTERONE  Lab Results  Component Value Date   HGBA1C 6.3 05/03/2020    Urinalysis    Component Value Date/Time   COLORURINE YELLOW 12/11/2019 1138   APPEARANCEUR Cloudy (A) 08/02/2020 0832   LABSPEC 1.025 12/11/2019 1138   PHURINE 6.0 12/11/2019 1138   GLUCOSEU Negative 08/02/2020 0832   GLUCOSEU NEGATIVE 12/11/2019 1138   HGBUR NEGATIVE 12/11/2019 1138   BILIRUBINUR Negative 08/02/2020 [place] 12/11/2019 1138  PROTEINUR Negative 08/02/2020 0832   UROBILINOGEN 0.2 12/11/2019 1138   NITRITE Negative 08/02/2020 0832   NITRITE NEGATIVE 12/11/2019 1138   LEUKOCYTESUR Negative 08/02/2020 0832   LEUKOCYTESUR NEGATIVE 12/11/2019 1138    Pertinent Imaging:   Assessment & Plan: Patient has mixed incontinence.  She understands that she has significant overactive bladder with urge incontinence and especially bedwetting.  She has a lot of frequency.  Having said that she has moderate severe stress incontinence.  I will see her back on Myrbetriq 50 mg samples and prescription.  We will then talk about a bulking agent treatment.  I think this is the next best step versus third line overactive bladder treatment.  Persistent bedwetting and OAB symptoms will need to be discussed and she will need to know that she may need a stepwise approach  1. Urinary incontinence, unspecified type  - Urinalysis, Complete   No follow-ups on file.  Reece Packer, MD  [facility] [address], [place], [place] [address] [phone]

## 2020-09-14 LAB — URINALYSIS, COMPLETE
Bilirubin, UA: NEGATIVE
Glucose, UA: NEGATIVE
Ketones, UA: NEGATIVE
Nitrite, UA: NEGATIVE
Protein,UA: NEGATIVE
RBC, UA: NEGATIVE
Specific Gravity, UA: 1.02 (ref 1.005–1.030)
Urobilinogen, Ur: 0.2 mg/dL (ref 0.2–1.0)
pH, UA: 5 (ref 5.0–7.5)

## 2020-09-14 LAB — MICROSCOPIC EXAMINATION: Bacteria, UA: NONE SEEN

## 2020-09-15 ENCOUNTER — Ambulatory Visit: Payer: Medicare Other | Admitting: Internal Medicine

## 2020-09-21 ENCOUNTER — Telehealth: Payer: Self-pay | Admitting: Urology

## 2020-09-21 ENCOUNTER — Telehealth: Payer: Self-pay | Admitting: Internal Medicine

## 2020-09-21 NOTE — Telephone Encounter (Signed)
PT called back to return phone call from earlier. 

## 2020-09-21 NOTE — Telephone Encounter (Signed)
Called and left message for pt to call back and schedule appt to discuss. Cannot order MRI with out evaluation first,

## 2020-09-21 NOTE — Telephone Encounter (Signed)
Pt called to inform of side effects of 5 days of Mybetriq her symptoms are very bad headaches not relieved with tylenol, constipation, can't walk due to swimmy headedness, achy all over, dry mouth. Pt has opted to stop taking the medication and will follow up at June appt. She just wanted to document her side effects.  FYI

## 2020-09-21 NOTE — Telephone Encounter (Signed)
Patient would like a referral for a MRI to check her back. Patient fell 1 1/2 years ago and she is having back pain.

## 2020-09-22 NOTE — Telephone Encounter (Signed)
Pt returned my call about MRI. Advised that MRI cannot be done without evaluation. Offered to move patients appt up. She is scheduled for 6/23. Patient declined and stated she will wait until appt on 6/23

## 2020-09-22 NOTE — Telephone Encounter (Signed)
LMTCB

## 2020-10-15 ENCOUNTER — Telehealth: Payer: Self-pay | Admitting: Internal Medicine

## 2020-10-18 ENCOUNTER — Other Ambulatory Visit: Payer: Self-pay

## 2020-10-18 ENCOUNTER — Telehealth: Payer: Self-pay | Admitting: Internal Medicine

## 2020-10-18 MED ORDER — PANTOPRAZOLE SODIUM 20 MG PO TBEC: 20.0000 mg | DELAYED_RELEASE_TABLET | Freq: Two times a day (BID) | ORAL | 0 refills | Status: DC

## 2020-10-18 MED ORDER — PANTOPRAZOLE SODIUM 20 MG PO TBEC
20.0000 mg | DELAYED_RELEASE_TABLET | Freq: Two times a day (BID) | ORAL | 0 refills | Status: DC
Start: 2020-10-18 — End: 2020-10-18

## 2020-10-18 NOTE — Telephone Encounter (Signed)
PT called to request a refill of their pantoprazole (PROTONIX) 40 MG tablet to be called into [facility]. PT states it should be twice daily and she is out of them.

## 2020-10-18 NOTE — Telephone Encounter (Signed)
Called this patient to get clarification on current usage of Pantoprazole. Dr.Scott prescribed it to pt to take 1 tablet daily. Pt states she is taking two times a day. Pt read label of medication off to me stating it was the Pantoprazole and to take 2 tablets daily. Okay to refill?

## 2020-10-18 NOTE — Telephone Encounter (Signed)
I spoke with patient & she stated that she could eat without taking medication. She was willing to try the 20 mg BID instead of Protonix 40 mg. I explained that was a treatment dose usually & not just for daily acid reflux. Pt was agreeable & I have sent the 20 mg BID to [facility].

## 2020-10-18 NOTE — Telephone Encounter (Signed)
Call pt  im covering for dr scott  Looks like she had been on protonix 40mg  daily  However seems  Like she is indicating that she took this dose separately  Would she prefer me to send in protonix 20mg  BID instead?  Please ensure no pain or trouble swallowing   EGD 2017 gastritis seen

## 2020-10-19 NOTE — Telephone Encounter (Signed)
noted 

## 2020-10-21 ENCOUNTER — Other Ambulatory Visit: Payer: Self-pay

## 2020-10-21 ENCOUNTER — Telehealth: Payer: Self-pay

## 2020-10-21 MED ORDER — PANTOPRAZOLE SODIUM 40 MG PO TBEC: 40.0000 mg | DELAYED_RELEASE_TABLET | Freq: Every day | ORAL | 3 refills | Status: DC

## 2020-10-21 NOTE — Telephone Encounter (Signed)
Patient called about her Protonix & that there was an issue with [facility]. She asked if I would call them. A fax was received today that per her insurance that only the one tablet daily would be covered. I called patient & told her that I would send the one 40mg  Protonix to take 0.5 tab in the morning & 0.5 in the evening. They should be scored so she can half. Patient was agreeable & I have sent. She stated that she cannot eat comfortably without this medication.

## 2020-10-25 ENCOUNTER — Other Ambulatory Visit: Payer: Self-pay

## 2020-10-25 ENCOUNTER — Encounter: Payer: Self-pay | Admitting: Urology

## 2020-10-25 ENCOUNTER — Ambulatory Visit (INDEPENDENT_AMBULATORY_CARE_PROVIDER_SITE_OTHER): Payer: Medicare Other | Admitting: Urology

## 2020-10-25 VITALS — BP 118/69 | HR 83

## 2020-10-25 DIAGNOSIS — N3946 Mixed incontinence: Secondary | ICD-10-CM | POA: Diagnosis not present

## 2020-10-25 NOTE — Patient Instructions (Signed)

## 2020-10-25 NOTE — Progress Notes (Signed)
10/25/2020 1:22 PM   Angela Cohen 08-Feb-1941 [id number]  Referring provider: Einar Pheasant, [address] [place],  [place] [address]  Chief Complaint  Patient presents with   Follow-up    HPI: I was consulted to assess the patient's urinary continence.  She has stress incontinence with coughing sneezing as well as urge incontinence.  Both are significant.  She soaks 4 pads a day and uses for heavy pads at night for bedwetting.   She voids every 30 to 60 minutes and cannot hold it for 2 hours.  She is up 3-4 times a night.   She has had a hysterectomy.  She is prone to constipation.  She has had a previous bladder suspension   She tried various medications in the past and possibly Ditropan.  She cannot remember Myrbetriq.   Patient has mixed incontinence.  She has high-volume mixed incontinence and bedwetting.  Role of urodynamics and cystoscopy discussed.  Patient was actually leaking spontaneously when she was getting up for the pelvic examination.  She also has obvious stress incontinence and pelvic examination.  A bulking agent would be much more prudent than a sling.  She likely has a very overactive bladder with high-volume urge incontinence and bedwetting.  Call if urine culture is positive   During urodynamics the patient was catheterized for 0 mL.  Maximum bladder capacity 399 mL.  Bladder was stable.  She had stress incontinence during the test but the leak point pressure if not registered.  Having said that it appears from the review of the flow line that was she was leaking a significant amount at approximately 70 cm of water in even as low as 20 cm of water during voluntary voiding she voided 128 mL with a maximal flow 5 mils per second.  Maximum voiding pressure 10 cm of water.  Residual was 294 mL.  EMG activity increased during the voiding phase.  Bladder neck descent at 1 to 2 cm.  The details of the urodynamics are signed dictated   On pelvic  examination patient had grade 1 hypermobility.  Mild positive cough test after cystoscopy.  Moderate atrophy   Patient reports that she leaks a lot with coughing.  She also has high-volume bedwetting wears 3 pads a day at night.   Cystoscopy: normal  Patient has mixed incontinence.  She understands that she has significant overactive bladder with urge incontinence and especially bedwetting.  She has a lot of frequency.  Having said that she has moderate severe stress incontinence.  I will see her back on Myrbetriq 50 mg samples and prescription.  We will then talk about a bulking agent treatment.  I think this is the next best step versus third line overactive bladder treatment.  Persistent bedwetting and OAB symptoms will need to be discussed and she will need to know that she may need a stepwise approach  Today Frequency stable. Patient had headache and dizziness on Myrbetriq.  Still has urge incontinence and bedwetting and stress incontinence. I discussed all 3 refractory treatments in detail with full template.  She does have some ankle edema.  3 handouts given.  She wants to think about it.  She was not that excited about either one of them. Her daughter has a similar problems of Botox patient I went over bulking agent in full detail.  She understands excess rate with stress incontinence of both 70%.  She understands at best for bedwetting and urge incontinence is 50%.  We talked about retreatment and rare risk including pain erosion and sequelae.  We talked about retention risk with usual template.    PMH: Past Medical History:  Diagnosis Date   Fibrocystic breast disease    Gastritis    EGD - schatzki's ring, gastritis and gastric polyps   History of colon polyps    Hypercholesterolemia    Hypertension    MAI (mycobacterium avium-intracellulare) infection ([place])    Osteoporosis    Reflux esophagitis     Surgical History: Past Surgical History:  Procedure Laterality Date    ABDOMINAL HYSTERECTOMY  1998   bladder tact     BREAST EXCISIONAL BIOPSY Bilateral 1970's   benign   BREAST EXCISIONAL BIOPSY Bilateral 2010   pt  stated dr Tamala Julian did bilat axilla lumps neg    BREAST SURGERY     cyst(benign)   COLONOSCOPY N/A 09/28/2014   Procedure: COLONOSCOPY;  Surgeon: Manya Silvas, MD;  Location: [facility];  Service: Endoscopy;  Laterality: N/A;   ESOPHAGOGASTRODUODENOSCOPY N/A 09/28/2014   Procedure: ESOPHAGOGASTRODUODENOSCOPY (EGD);  Surgeon: Manya Silvas, MD;  Location: [facility] ENDOSCOPY;  Service: Endoscopy;  Laterality: N/A;   ESOPHAGOGASTRODUODENOSCOPY (EGD) WITH PROPOFOL N/A 04/26/2016   Procedure: ESOPHAGOGASTRODUODENOSCOPY (EGD) WITH PROPOFOL;  Surgeon: Manya Silvas, MD;  Location: [facility] ENDOSCOPY;  Service: Endoscopy;  Laterality: N/A;   excision of right axillary lipoma  10/07   FOOT SURGERY Right    HEMORRHOID SURGERY     HERNIA REPAIR  10/07   NASAL SEPTUM SURGERY      Home Medications:  Allergies as of 10/25/2020   No Known Allergies      Medication List        Accurate as of October 25, 2020  1:22 PM. If you have any questions, ask your nurse or doctor.          aspirin 81 MG EC tablet Take 81 mg by mouth daily. Swallow whole.   Fish Oil 1000 MG Caps Take 3,000 mg by mouth daily.   metaxalone 800 MG tablet Commonly known as: SKELAXIN Take 1 tablet by mouth 3 (three) times daily.   metoprolol succinate 25 MG 24 hr tablet Commonly known as: TOPROL-XL Take 1 tablet (25 mg total) by mouth 2 (two) times daily.   montelukast 10 MG tablet Commonly known as: SINGULAIR Take 1 tablet (10 mg total) by mouth at bedtime.   multivitamin with minerals Tabs tablet Take 1 tablet by mouth daily.   pantoprazole 20 MG tablet Commonly known as: PROTONIX Take 1 tablet (20 mg total) by mouth 2 (two) times daily.   pantoprazole 40 MG tablet Commonly known as: PROTONIX Take 1 tablet (40 mg total) by mouth daily.   rosuvastatin 10 MG  tablet Commonly known as: CRESTOR TAKE 1 TABLET DAILY   Trelegy Ellipta 100-62.5-25 MCG/INH Aepb Generic drug: Fluticasone-Umeclidin-Vilant Inhale 1 puff into the lungs daily.        Allergies: No Known Allergies  Family History: Family History  Problem Relation Age of Onset   Heart disease Mother 80       heart attack   Cancer Father        lung   Stroke Brother    Diabetes Brother    Diabetes Sister    Breast cancer Sister 68    Social History:  reports that she has never smoked. She has never used smokeless tobacco. She reports that she does not drink alcohol and does not use drugs.  ROS:  Physical Exam: There were no vitals taken for this visit.  Constitutional:  Alert and oriented, No acute distress. HEENT: [place] AT, moist mucus membranes.  Trachea midline, no masses.   Laboratory Data: Lab Results  Component Value Date   WBC 5.2 12/11/2019   HGB 14.8 12/11/2019   HCT 43.7 12/11/2019   MCV 96.7 12/11/2019   PLT 231.0 12/11/2019    Lab Results  Component Value Date   CREATININE 0.76 05/03/2020    No results found for: PSA  No results found for: TESTOSTERONE  Lab Results  Component Value Date   HGBA1C 6.3 05/03/2020    Urinalysis    Component Value Date/Time   COLORURINE YELLOW 12/11/2019 1138   APPEARANCEUR Hazy (A) 09/13/2020 0918   LABSPEC 1.025 12/11/2019 1138   PHURINE 6.0 12/11/2019 1138   GLUCOSEU Negative 09/13/2020 0918   GLUCOSEU NEGATIVE 12/11/2019 1138   HGBUR NEGATIVE 12/11/2019 1138   BILIRUBINUR Negative 09/13/2020 0918   KETONESUR NEGATIVE 12/11/2019 1138   PROTEINUR Negative 09/13/2020 0918   UROBILINOGEN 0.2 12/11/2019 1138   NITRITE Negative 09/13/2020 0918   NITRITE NEGATIVE 12/11/2019 1138   LEUKOCYTESUR Trace (A) 09/13/2020 0918   LEUKOCYTESUR NEGATIVE 12/11/2019 1138    Pertinent Imaging:   Assessment & Plan: Handouts given.  Patient will call with  treatment choice if she chooses 1 but otherwise we have reached the end of the treatment pathway.  I did not give her the new beta 3 agonist due to side effects on the Myrbetriq and we did not have samples  There are no diagnoses linked to this encounter.  No follow-ups on file.  Reece Packer, MD  [facility] [address] [place],  [address] [phone]

## 2020-10-27 ENCOUNTER — Other Ambulatory Visit: Payer: Self-pay | Admitting: Family

## 2020-10-29 ENCOUNTER — Other Ambulatory Visit: Payer: Self-pay

## 2020-10-29 MED ORDER — PANTOPRAZOLE SODIUM 20 MG PO TBEC: 20.0000 mg | DELAYED_RELEASE_TABLET | Freq: Two times a day (BID) | ORAL | 1 refills | Status: DC

## 2020-10-29 MED ORDER — PANTOPRAZOLE SODIUM 40 MG PO TBEC
1.0000 | DELAYED_RELEASE_TABLET | Freq: Every day | ORAL | 3 refills | Status: DC
Start: 2020-10-29 — End: 2020-10-29

## 2020-11-01 ENCOUNTER — Other Ambulatory Visit (INDEPENDENT_AMBULATORY_CARE_PROVIDER_SITE_OTHER): Payer: Medicare Other

## 2020-11-01 ENCOUNTER — Other Ambulatory Visit: Payer: Self-pay

## 2020-11-01 ENCOUNTER — Other Ambulatory Visit: Payer: Self-pay | Admitting: Internal Medicine

## 2020-11-01 DIAGNOSIS — I10 Essential (primary) hypertension: Secondary | ICD-10-CM

## 2020-11-01 DIAGNOSIS — R739 Hyperglycemia, unspecified: Secondary | ICD-10-CM | POA: Diagnosis not present

## 2020-11-01 DIAGNOSIS — E7800 Pure hypercholesterolemia, unspecified: Secondary | ICD-10-CM | POA: Diagnosis not present

## 2020-11-01 LAB — HEPATIC FUNCTION PANEL
ALT: 27 U/L (ref 0–35)
AST: 23 U/L (ref 0–37)
Albumin: 4.4 g/dL (ref 3.5–5.2)
Alkaline Phosphatase: 77 U/L (ref 39–117)
Bilirubin, Direct: 0.1 mg/dL (ref 0.0–0.3)
Total Bilirubin: 0.6 mg/dL (ref 0.2–1.2)
Total Protein: 7 g/dL (ref 6.0–8.3)

## 2020-11-01 LAB — LIPID PANEL
Cholesterol: 165 mg/dL (ref 0–200)
HDL: 82.1 mg/dL (ref >39.00)
LDL Cholesterol: 52 mg/dL (ref 0–99)
NonHDL: 82.47
Total CHOL/HDL Ratio: 2
Triglycerides: 153 mg/dL — ABNORMAL HIGH (ref 0.0–149.0)
VLDL: 30.6 mg/dL (ref 0.0–40.0)

## 2020-11-01 LAB — BASIC METABOLIC PANEL
BUN: 12 mg/dL (ref 6–23)
CO2: 30 mEq/L (ref 19–32)
Calcium: 9.1 mg/dL (ref 8.4–10.5)
Chloride: 104 mEq/L (ref 96–112)
Creatinine, Ser: 0.82 mg/dL (ref 0.40–1.20)
GFR: 68.31 mL/min (ref >60.00)
Glucose, Bld: 119 mg/dL — ABNORMAL HIGH (ref 70–99)
Potassium: 4.8 mEq/L (ref 3.5–5.1)
Sodium: 142 mEq/L (ref 135–145)

## 2020-11-01 LAB — HEMOGLOBIN A1C: Hgb A1c MFr Bld: 6.5 % (ref 4.6–6.5)

## 2020-11-04 ENCOUNTER — Other Ambulatory Visit: Payer: Self-pay

## 2020-11-04 ENCOUNTER — Ambulatory Visit (INDEPENDENT_AMBULATORY_CARE_PROVIDER_SITE_OTHER): Payer: Medicare Other | Admitting: Internal Medicine

## 2020-11-04 ENCOUNTER — Ambulatory Visit (INDEPENDENT_AMBULATORY_CARE_PROVIDER_SITE_OTHER): Payer: Medicare Other

## 2020-11-04 ENCOUNTER — Encounter: Payer: Self-pay | Admitting: Internal Medicine

## 2020-11-04 DIAGNOSIS — M545 Low back pain, unspecified: Secondary | ICD-10-CM | POA: Insufficient documentation

## 2020-11-04 DIAGNOSIS — I10 Essential (primary) hypertension: Secondary | ICD-10-CM | POA: Diagnosis not present

## 2020-11-04 DIAGNOSIS — K219 Gastro-esophageal reflux disease without esophagitis: Secondary | ICD-10-CM | POA: Diagnosis not present

## 2020-11-04 DIAGNOSIS — D329 Benign neoplasm of meninges, unspecified: Secondary | ICD-10-CM | POA: Diagnosis not present

## 2020-11-04 DIAGNOSIS — E7800 Pure hypercholesterolemia, unspecified: Secondary | ICD-10-CM

## 2020-11-04 DIAGNOSIS — Z86010 Personal history of colonic polyps: Secondary | ICD-10-CM

## 2020-11-04 DIAGNOSIS — M5442 Lumbago with sciatica, left side: Secondary | ICD-10-CM | POA: Diagnosis not present

## 2020-11-04 DIAGNOSIS — E1165 Type 2 diabetes mellitus with hyperglycemia: Secondary | ICD-10-CM

## 2020-11-04 DIAGNOSIS — R739 Hyperglycemia, unspecified: Secondary | ICD-10-CM

## 2020-11-04 NOTE — Progress Notes (Signed)
[name], female   DOB: February 04, 1941, 80 y.o.   MRN: [id number]   Subjective:    [name], female    DOB: 05-22-1940, 80 y.o.   MRN: [id number]  HPI This visit occurred during the SARS-CoV-2 public health emergency.  Safety protocols were in place, including screening questions prior to the visit, additional usage of staff PPE, and extensive cleaning of exam room while observing appropriate contact time as indicated for disinfecting solutions.   Patient here for a scheduled follow up.  Here to follow up regarding her blood pressure and cholesterol.  Per review, she has a history of asthma.  She has seen pulmonary.  Was started on Trelegy.  Continues on Singulair.  Breathing much improved.  No increased shortness of breath.  No chest pain or chest tightness reported.  Reports in 2019 she fell.  States she will notice some intermittent lower back pain and left leg pain.  Flares intermittently.  Has noticed recently.  Affecting her activity.  Does report acid reflux.  Apparently was out of her medication for a period of time.  Has restarted 2/day.  She currently has a 20 mg tablets.  Since she has been back on the medication her symptoms are improved.  No abdominal pain reported.  Discussed recent labs.  A1c on most recent check was 6.5.  Discussed diagnosis of diabetes.  Discussed referral to a nutritionist.  She was agreeable.  Tolerating Crestor.   Past Medical History:  Diagnosis Date   Fibrocystic breast disease    Gastritis    EGD - schatzki's ring, gastritis and gastric polyps   History of colon polyps    Hypercholesterolemia    Hypertension    MAI (mycobacterium avium-intracellulare) infection ([place])    Osteoporosis    Reflux esophagitis    Past Surgical History:  Procedure Laterality Date   ABDOMINAL HYSTERECTOMY  1998   bladder tact     BREAST EXCISIONAL BIOPSY Bilateral 1970's   benign   BREAST EXCISIONAL BIOPSY Bilateral 2010   pt  stated dr Tamala Julian  did bilat axilla lumps neg    BREAST SURGERY     cyst(benign)   COLONOSCOPY N/A 09/28/2014   Procedure: COLONOSCOPY;  Surgeon: Manya Silvas, MD;  Location: [facility];  Service: Endoscopy;  Laterality: N/A;   ESOPHAGOGASTRODUODENOSCOPY N/A 09/28/2014   Procedure: ESOPHAGOGASTRODUODENOSCOPY (EGD);  Surgeon: Manya Silvas, MD;  [facility] ENDOSCOPY;  Service: Endoscopy;  Laterality: N/A;   ESOPHAGOGASTRODUODENOSCOPY (EGD) WITH PROPOFOL N/A 04/26/2016   Procedure: ESOPHAGOGASTRODUODENOSCOPY (EGD) WITH PROPOFOL;  Surgeon: Manya Silvas, MD;  Location: [facility] ENDOSCOPY;  Service: Endoscopy;  Laterality: N/A;   excision of right axillary lipoma  10/07   FOOT SURGERY Right    HEMORRHOID SURGERY     HERNIA REPAIR  10/07   NASAL SEPTUM SURGERY     Family History  Problem Relation Age of Onset   Heart disease Mother 34       heart attack   Cancer Father        lung   Stroke Brother    Diabetes Brother    Diabetes Sister    Breast cancer Sister 51   Social History   Socioeconomic History   Marital status: Widowed    Spouse name: Not on file   Number of children: Not on file   Years of education: Not on file   Highest education level: Not on file  Occupational History   Not on file  Tobacco Use   Smoking status: Never   Smokeless tobacco: Never  Vaping Use   Vaping Use: Never used  Substance and Sexual Activity   Alcohol use: No    Alcohol/week: 0.0 standard drinks   Drug use: No   Sexual activity: Not on file  Other Topics Concern   Not on file  Social History Narrative   Not on file   Social Determinants of Health   Financial Resource Strain: Not on file  Food Insecurity: Not on file  Transportation Needs: Not on file  Physical Activity: Not on file  Stress: Not on file  Social Connections: Not on file    Review of Systems  Constitutional:  Negative for appetite change and unexpected weight change.  HENT:  Negative for congestion and sinus pressure.    Respiratory:  Negative for cough, chest tightness and shortness of breath.   Cardiovascular:  Negative for chest pain, palpitations and leg swelling.  Gastrointestinal:  Negative for abdominal pain, diarrhea, nausea and vomiting.  Genitourinary:  Negative for difficulty urinating and dysuria.  Musculoskeletal:  Positive for back pain. Negative for joint swelling and myalgias.  Skin:  Negative for color change and rash.  Neurological:  Negative for dizziness, light-headedness and headaches.  Psychiatric/Behavioral:  Negative for agitation and dysphoric mood.       Objective:    Physical Exam Vitals reviewed.  Constitutional:      General: She is not in acute distress.    Appearance: Normal appearance.  HENT:     Head: Normocephalic and atraumatic.     Right Ear: External ear normal.     Left Ear: External ear normal.  Eyes:     General: No scleral icterus.       Right eye: No discharge.        Left eye: No discharge.     Conjunctiva/sclera: Conjunctivae normal.  Neck:     Thyroid: No thyromegaly.  Cardiovascular:     Rate and Rhythm: Normal rate and regular rhythm.  Pulmonary:     Effort: No respiratory distress.     Breath sounds: Normal breath sounds. No wheezing.  Abdominal:     General: Bowel sounds are normal.     Palpations: Abdomen is soft.     Tenderness: There is no abdominal tenderness.  Musculoskeletal:        General: No swelling or tenderness.     Cervical back: Neck supple. No tenderness.  Lymphadenopathy:     Cervical: No cervical adenopathy.  Skin:    Findings: No erythema or rash.  Neurological:     Mental Status: She is alert.  Psychiatric:        Mood and Affect: Mood normal.        Behavior: Behavior normal.    BP 120/70 (BP Location: Left Arm, Patient Position: Sitting, Cuff Size: Large)   Pulse 84   Temp 98.2 F (36.8 C) (Oral)   Ht 5\' 4"  (1.626 m)   Wt 202 lb 12.8 oz (92 kg)   SpO2 95%   BMI 34.81 kg/m  Wt Readings from Last 3  Encounters:  11/04/20 202 lb 12.8 oz (92 kg)  08/02/20 187 lb (84.8 kg)  05/06/20 198 lb (89.8 kg)    Outpatient Encounter Medications as of 11/04/2020  Medication Sig   aspirin 81 MG EC tablet Take 81 mg by mouth daily. Swallow whole.   Fluticasone-Umeclidin-Vilant (TRELEGY ELLIPTA) 100-62.5-25 MCG/INH AEPB Inhale 1 puff into the lungs daily.   metoprolol  succinate (TOPROL-XL) 25 MG 24 hr tablet Take 1 tablet (25 mg total) by mouth 2 (two) times daily.   montelukast (SINGULAIR) 10 MG tablet Take 1 tablet (10 mg total) by mouth at bedtime.   Multiple Vitamin (MULTIVITAMIN WITH MINERALS) TABS Take 1 tablet by mouth daily.   Omega-3 Fatty Acids (FISH OIL) 1000 MG CAPS Take 3,000 mg by mouth daily.   pantoprazole (PROTONIX) 20 MG tablet Take 1 tablet (20 mg total) by mouth 2 (two) times daily before a meal.   [DISCONTINUED] rosuvastatin (CRESTOR) 10 MG tablet TAKE 1 TABLET DAILY   [DISCONTINUED] metaxalone (SKELAXIN) 800 MG tablet Take 1 tablet by mouth 3 (three) times daily. (Patient not taking: Reported on 11/04/2020)   No facility-administered encounter medications on file as of 11/04/2020.     Lab Results  Component Value Date   WBC 5.2 12/11/2019   HGB 14.8 12/11/2019   HCT 43.7 12/11/2019   PLT 231.0 12/11/2019   GLUCOSE 119 (H) 11/01/2020   CHOL 165 11/01/2020   TRIG 153.0 (H) 11/01/2020   HDL 82.10 11/01/2020   LDLCALC 52 11/01/2020   ALT 27 11/01/2020   AST 23 11/01/2020   NA 142 11/01/2020   K 4.8 11/01/2020   CL 104 11/01/2020   CREATININE 0.82 11/01/2020   BUN 12 11/01/2020   CO2 30 11/01/2020   TSH 1.68 12/11/2019   HGBA1C 6.5 11/01/2020       Assessment & Plan:   Problem List Items Addressed This Visit     Essential hypertension, benign    Blood pressure doing well. On metoprolol.  Follow pressures.follow metabolic panel.        GERD (gastroesophageal reflux disease)    Off Protonix for a short period of time.  Had reflux symptoms.  Back on Protonix now.   Taken to 20 mg/day.  Acid reflux controlled.  Follow-up.  Consider follow-up with GI.        History of colonic polyps    Per review, colonoscopy May 2016.  Recommended follow-up in 5 years.  We will need to discuss regarding follow-up.       Hypercholesterolemia    Continue Crestor.  Low-cholesterol diet and exercise.  Follow-up fast lipid profile liver panel.       Hyperglycemia    Low-carb diet and exercise.  Follow metabolic panel and G4W.       Low back pain    Remote injury as outlined.  Intermittent flares.  Now with low back pain and pain rating down the left leg.  She was questioning the possible need for MRI.  Will check LS spine x-ray.  Further work-up pending results.       Relevant Orders   DG Lumbar Spine 2-3 Views (Completed)   Meningioma ([place])    Last evaluation with neurology, they reviewed MRI and felt stable.  No follow-up recommended.       Type 2 diabetes mellitus with hyperglycemia (HCC)    Recent A1c was 6.5.  Discussed the diagnosis of diabetes mellitus.  Discussed low-carb diet and exercise.  Discussed lifestyle referral for diabetes education.  She is agreeable.       Relevant Orders   Referral to Nutrition and Diabetes Services     Einar Pheasant, MD

## 2020-11-05 ENCOUNTER — Telehealth: Payer: Self-pay | Admitting: Internal Medicine

## 2020-11-05 ENCOUNTER — Other Ambulatory Visit: Payer: Self-pay

## 2020-11-05 MED ORDER — ROSUVASTATIN CALCIUM 10 MG PO TABS: 10.0000 mg | ORAL_TABLET | Freq: Every day | ORAL | 1 refills | Status: DC

## 2020-11-05 NOTE — Telephone Encounter (Signed)
Pt called in requesting refill be sent to [facility] [address] for this one time due to Goodrx coupon. Refilled for a 90 day supply and 1 refill.

## 2020-11-05 NOTE — Telephone Encounter (Signed)
Pt called back and notified of rx being sent in to pharmacy

## 2020-11-05 NOTE — Telephone Encounter (Signed)
PT called to advise that she needs a refill on their rosuvastatin (CRESTOR) 10 MG tablet to be called into the [facility] on [address]. PT would like a callback when its been called in.

## 2020-11-05 NOTE — Telephone Encounter (Signed)
LMTCB

## 2020-11-05 NOTE — Telephone Encounter (Signed)
Medication has already been refilled on 11/01/20 for 90 days with 1 refill through mail order pharmacy.

## 2020-11-09 ENCOUNTER — Encounter: Payer: Self-pay | Admitting: Internal Medicine

## 2020-11-09 ENCOUNTER — Telehealth: Payer: Self-pay | Admitting: Internal Medicine

## 2020-11-09 DIAGNOSIS — E1165 Type 2 diabetes mellitus with hyperglycemia: Secondary | ICD-10-CM | POA: Insufficient documentation

## 2020-11-09 NOTE — Assessment & Plan Note (Signed)
Low-carb diet and exercise.  Follow metabolic panel and A1c. 

## 2020-11-09 NOTE — Assessment & Plan Note (Signed)
Recent A1c was 6.5.  Discussed the diagnosis of diabetes mellitus.  Discussed low-carb diet and exercise.  Discussed lifestyle referral for diabetes education.  She is agreeable.

## 2020-11-09 NOTE — Telephone Encounter (Signed)
Per chart, patient aware of results.

## 2020-11-09 NOTE — Assessment & Plan Note (Signed)
Per review, colonoscopy May 2016.  Recommended follow-up in 5 years.  We will need to discuss regarding follow-up.

## 2020-11-09 NOTE — Assessment & Plan Note (Signed)
Continue Crestor.  Low-cholesterol diet and exercise.  Follow-up fast lipid profile liver panel. 

## 2020-11-09 NOTE — Assessment & Plan Note (Signed)
Last evaluation with neurology, they reviewed MRI and felt stable.  No follow-up recommended. 

## 2020-11-09 NOTE — Telephone Encounter (Signed)
Patient called for her x-ray results. Please call her at [phone].

## 2020-11-09 NOTE — Assessment & Plan Note (Signed)
Off Protonix for a short period of time.  Had reflux symptoms.  Back on Protonix now.  Taken to 20 mg/day.  Acid reflux controlled.  Follow-up.  Consider follow-up with GI.

## 2020-11-09 NOTE — Assessment & Plan Note (Signed)
Blood pressure doing well. On metoprolol.  Follow pressures.follow metabolic panel.

## 2020-11-09 NOTE — Assessment & Plan Note (Signed)
Remote injury as outlined.  Intermittent flares.  Now with low back pain and pain rating down the left leg.  She was questioning the possible need for MRI.  Will check LS spine x-ray.  Further work-up pending results.

## 2020-11-11 ENCOUNTER — Telehealth: Payer: Self-pay

## 2020-11-11 ENCOUNTER — Other Ambulatory Visit: Payer: Self-pay | Admitting: Internal Medicine

## 2020-11-11 DIAGNOSIS — M5442 Lumbago with sciatica, left side: Secondary | ICD-10-CM

## 2020-11-11 NOTE — Progress Notes (Signed)
Order placed for physical therapy referral.

## 2020-11-11 NOTE — Telephone Encounter (Signed)
Order placed for PT referral.  

## 2020-11-11 NOTE — Telephone Encounter (Signed)
Pt called back to let provider know she wants to do the PT that was recommended. Pt request that it be the [facility] in [place].

## 2020-11-22 ENCOUNTER — Other Ambulatory Visit: Payer: Self-pay | Admitting: Orthopedic Surgery

## 2020-11-22 ENCOUNTER — Other Ambulatory Visit (HOSPITAL_COMMUNITY): Payer: Self-pay | Admitting: Orthopedic Surgery

## 2020-11-22 DIAGNOSIS — M4807 Spinal stenosis, lumbosacral region: Secondary | ICD-10-CM

## 2020-11-22 DIAGNOSIS — M5136 Other intervertebral disc degeneration, lumbar region: Secondary | ICD-10-CM

## 2020-11-22 DIAGNOSIS — G8929 Other chronic pain: Secondary | ICD-10-CM

## 2020-11-22 DIAGNOSIS — M5442 Lumbago with sciatica, left side: Secondary | ICD-10-CM

## 2020-11-29 ENCOUNTER — Ambulatory Visit
Admission: RE | Admit: 2020-11-29 | Discharge: 2020-11-29 | Disposition: A | Discharge status: Home / Self Care | Payer: Medicare Other | Source: Ambulatory Visit | Attending: Orthopedic Surgery | Admitting: Orthopedic Surgery

## 2020-11-29 ENCOUNTER — Other Ambulatory Visit: Payer: Self-pay

## 2020-11-29 DIAGNOSIS — M5136 Other intervertebral disc degeneration, lumbar region: Secondary | ICD-10-CM | POA: Diagnosis present

## 2020-11-29 DIAGNOSIS — G8929 Other chronic pain: Secondary | ICD-10-CM | POA: Diagnosis present

## 2020-11-29 DIAGNOSIS — M5442 Lumbago with sciatica, left side: Secondary | ICD-10-CM | POA: Diagnosis present

## 2020-11-29 DIAGNOSIS — M5441 Lumbago with sciatica, right side: Secondary | ICD-10-CM | POA: Diagnosis present

## 2020-11-29 DIAGNOSIS — M4807 Spinal stenosis, lumbosacral region: Secondary | ICD-10-CM | POA: Insufficient documentation

## 2020-12-02 ENCOUNTER — Other Ambulatory Visit: Payer: Self-pay

## 2020-12-02 ENCOUNTER — Telehealth: Payer: Self-pay | Admitting: Internal Medicine

## 2020-12-02 DIAGNOSIS — Z1231 Encounter for screening mammogram for malignant neoplasm of breast: Secondary | ICD-10-CM

## 2020-12-02 NOTE — Telephone Encounter (Signed)
Patient is requesting a referral for a mammogram before her December appointment for a physical.

## 2020-12-02 NOTE — Telephone Encounter (Signed)
I called let patient know that mammogram was ordered & she could schedule after 03/05/21.

## 2020-12-02 NOTE — Telephone Encounter (Signed)
Are you okay with my ordered patient a mammogram. Appears she will be due after 10/22.

## 2020-12-02 NOTE — Telephone Encounter (Signed)
Yes - to be scheduled one year after last unless having issues. If any issues, let me know.

## 2020-12-16 ENCOUNTER — Encounter: Payer: Self-pay | Admitting: Pulmonary Disease

## 2020-12-16 ENCOUNTER — Ambulatory Visit (INDEPENDENT_AMBULATORY_CARE_PROVIDER_SITE_OTHER): Payer: Medicare Other | Admitting: Pulmonary Disease

## 2020-12-16 ENCOUNTER — Other Ambulatory Visit: Payer: Self-pay

## 2020-12-16 VITALS — BP 120/80 | HR 87 | Temp 98.1°F | Ht 64.0 in | Wt 200.4 lb

## 2020-12-16 DIAGNOSIS — R0602 Shortness of breath: Secondary | ICD-10-CM

## 2020-12-16 DIAGNOSIS — E669 Obesity, unspecified: Secondary | ICD-10-CM

## 2020-12-16 DIAGNOSIS — J4530 Mild persistent asthma, uncomplicated: Secondary | ICD-10-CM

## 2020-12-16 MED ORDER — FLUTICASONE FUROATE-VILANTEROL 100-25 MCG/INH IN AEPB: 1.0000 | INHALATION_SPRAY | Freq: Every day | RESPIRATORY_TRACT | 11 refills | Status: DC

## 2020-12-16 MED ORDER — ALBUTEROL SULFATE HFA 108 (90 BASE) MCG/ACT IN AERS: 2.0000 | INHALATION_SPRAY | Freq: Four times a day (QID) | RESPIRATORY_TRACT | 6 refills | Status: DC | PRN

## 2020-12-16 NOTE — Patient Instructions (Signed)
We are switching your inhaler to Breo Ellipta 100/25, 1 inhalation daily.  Sure you rinse your mouth well after you use it.  Try to use your inhaler daily.  We will see her in follow-up in 6 months time call sooner should any new problems arise.  Prescription for the [facility] was written for you at your request.  The emergency [facility]er [facility].

## 2020-12-16 NOTE — Progress Notes (Signed)
Subjective:    [name], female    DOB: Apr 24, 1941, 80 y.o.   MRN: [id number]  Chief Complaint  Patient presents with   Follow-up    Using Trelegy every other day d/t sore throat. Says she is doing well.     HPI Angela Cohen is a 79 year old lifelong never smoker follows here for the issue of moderate persistent asthma.  This is a scheduled follow-up visit.  She has been maintained on Trelegy Ellipta and Singulair.  She states that she is using Trelegy Ellipta every other day due to soreness in her mouth after she uses Trelegy.  This despite rinsing well.  She has not had any increased shortness of breath.  She notes that she also gets benefit from Singulair.  She needs a refill on albuterol rescue inhaler.  She does not use this inhaler hardly at all but it has expired.  She does not endorse any cough, sputum production, fevers, chills or sweats.  She does not note any dyspnea over baseline.  Otherwise she feels well and looks well.  DATA: 2D echo on 02/24/2020: LVEF 60 to 65%, no wall motion abnormalities, grade 1 DD, normal pulmonary artery pressure.  No valve disease noted. Pulmonary function testing 03/03/2020: Small airways obstructive disease with restrictive physiology likely due to obesity.  Diffusion capacity normal  Review of Systems A 10 point review of systems was performed and it is as noted above otherwise negative.  Patient Active Problem List   Diagnosis Date Noted   Type 2 diabetes mellitus with hyperglycemia ([place]) 11/09/2020   Low back pain 11/04/2020   Incontinence 05/13/2020   Chest pain 12/14/2019   SOB (shortness of breath) 12/14/2019   Nocturia 12/11/2019   Sleeping difficulty 09/08/2018   Hyperglycemia 05/06/2018   Cough 10/10/2017   Left hip pain 07/30/2017   Vaginal pain 04/09/2017   Chest tightness 04/02/2016   Axillary pain 03/22/2016   Meningioma ([place]) 02/21/2015   Dizziness 10/18/2014   Health care maintenance 10/18/2014   Dysphagia  03/23/2014   GERD (gastroesophageal reflux disease) 03/23/2014   Headache 10/05/2013   URI (upper respiratory infection) 10/05/2013   Urinary frequency 09/30/2013   Left ear pain 07/20/2013   Neck pain 03/31/2013   MAI (mycobacterium avium-intracellulare) infection ([place]) 08/31/2012   Essential hypertension, benign 08/31/2012   Hypercholesterolemia 08/31/2012   History of colonic polyps 08/31/2012   Gastritis 08/31/2012   Social History   Tobacco Use   Smoking status: Never   Smokeless tobacco: Never  Substance Use Topics   Alcohol use: No    Alcohol/week: 0.0 standard drinks   No Known Allergies  Current Meds  Medication Sig   aspirin 81 MG EC tablet Take 81 mg by mouth daily. Swallow whole.   Fluticasone-Umeclidin-Vilant (TRELEGY ELLIPTA) 100-62.5-25 MCG/INH AEPB Inhale 1 puff into the lungs daily.   metoprolol succinate (TOPROL-XL) 25 MG 24 hr tablet Take 1 tablet (25 mg total) by mouth 2 (two) times daily.   montelukast (SINGULAIR) 10 MG tablet Take 1 tablet (10 mg total) by mouth at bedtime.   Multiple Vitamin (MULTIVITAMIN WITH MINERALS) TABS Take 1 tablet by mouth daily.   Omega-3 Fatty Acids (FISH OIL) 1000 MG CAPS Take 3,000 mg by mouth daily.   pantoprazole (PROTONIX) 20 MG tablet Take 1 tablet (20 mg total) by mouth 2 (two) times daily before a meal.   rosuvastatin (CRESTOR) 10 MG tablet Take 1 tablet (10 mg total) by mouth daily.   Immunization History  Administered Date(s) Administered   Influenza Split 02/07/2013, 03/12/2014   Influenza-Unspecified 03/12/2014, 03/11/2015   PFIZER(Purple Top)SARS-COV-2 Vaccination 05/22/2019, 06/12/2019, 04/22/2020   Pneumococcal Conjugate-13 02/17/2015   Pneumococcal Polysaccharide-23 05/06/2020       Objective:   Physical Exam BP 120/80 (BP Location: Left Arm, Patient Position: Sitting, Cuff Size: Normal)   Pulse 87   Temp 98.1 F (36.7 C) (Oral)   Ht '5\' 4"'$  (1.626 m)   Wt 200 lb 6.4 oz (90.9 kg)   SpO2 98%   BMI  34.40 kg/m  GENERAL: Well-developed, overweight woman in no acute distress.  Fully ambulatory.   HEAD: Normocephalic, atraumatic. EYES: Pupils equal, round, reactive to light.  No scleral icterus. MOUTH: Nose/mouth/throat not examined due to masking requirements for COVID 19.   NECK: Supple. No thyromegaly. Trachea midline. No JVD.  No adenopathy. PULMONARY: Good air entry bilaterally.    Coarse breath sounds but otherwise, no adventitious sounds. CARDIOVASCULAR: S1 and S2. Regular rate and rhythm.  No rubs, murmurs or gallops heard. ABDOMEN: Benign MUSCULOSKELETAL: He has significant OA changes in her hands, no clubbing, no edema. NEUROLOGIC: No focal deficits noted.  Fluent speech, no gait disturbance. SKIN: Intact,warm,dry.  Limited exam shows no rashes. PSYCH: Mood and behavior are appropriate.     Assessment & Plan:     ICD-10-CM   1. Mild persistent asthma without complication  A999333    She appears to be well compensated at present Issues with Trelegy Switched to Breo Ellipta 100/25 1 inhalation daily     2. SOB (shortness of breath)  R06.02    At baseline    3. Obesity (BMI 30.0-34.9)  E66.9    This issue adds complexity to her management Weight loss recommended     Meds ordered this encounter  Medications   albuterol (VENTOLIN HFA) 108 (90 Base) MCG/ACT inhaler    Sig: Inhale 2 puffs into the lungs every 6 (six) hours as needed for wheezing or shortness of breath.    Dispense:  8 g    Refill:  6   fluticasone furoate-vilanterol (BREO ELLIPTA) 100-25 MCG/INH AEPB    Sig: Inhale 1 puff into the lungs daily.    Dispense:  29 each    Refill:  11   We will discontinue Trelegy, have switched her to Breo Ellipta 100/25 1 inhalation daily to see if this is less irritating.  Continue Singulair.  Continue as needed albuterol.  We will see her in follow-up in 6 months time she is to contact us prior to that time should any problems arise.  Renold Don, MD Advanced  Bronchoscopy PCCM [facility]    *This note was dictated using voice recognition software/Dragon.  Despite best efforts to proofread, errors can occur which can change the meaning.  Any change was purely unintentional.

## 2020-12-22 ENCOUNTER — Ambulatory Visit: Payer: Medicare Other | Admitting: Internal Medicine

## 2021-01-18 ENCOUNTER — Telehealth: Payer: Self-pay | Admitting: Pulmonary Disease

## 2021-01-18 MED ORDER — ALBUTEROL SULFATE HFA 108 (90 BASE) MCG/ACT IN AERS: 2.0000 | INHALATION_SPRAY | Freq: Four times a day (QID) | RESPIRATORY_TRACT | 1 refills | Status: DC | PRN

## 2021-01-18 NOTE — Telephone Encounter (Signed)
Spoke to [place] with [facility] and clarified ventolin qty. Rx has been corrected.  Nothing further needed at this time.

## 2021-01-19 ENCOUNTER — Other Ambulatory Visit: Payer: Self-pay | Admitting: Family

## 2021-02-28 ENCOUNTER — Telehealth: Payer: Self-pay | Admitting: Internal Medicine

## 2021-02-28 MED ORDER — METOPROLOL SUCCINATE ER 25 MG PO TB24: 25.0000 mg | ORAL_TABLET | Freq: Two times a day (BID) | ORAL | 1 refills | Status: DC

## 2021-02-28 NOTE — Telephone Encounter (Signed)
metoprolol succinate (TOPROL-XL) 25 MG 24 hr tablet Needing medication sent to [facility] today she is low.

## 2021-03-22 ENCOUNTER — Ambulatory Visit
Admission: RE | Admit: 2021-03-22 | Discharge: 2021-03-22 | Disposition: A | Discharge status: Home / Self Care | Payer: Medicare Other | Source: Ambulatory Visit | Attending: Neurosurgery | Admitting: Neurosurgery

## 2021-03-22 ENCOUNTER — Other Ambulatory Visit: Payer: Self-pay | Admitting: Neurosurgery

## 2021-03-22 DIAGNOSIS — M4155 Other secondary scoliosis, thoracolumbar region: Secondary | ICD-10-CM

## 2021-03-22 DIAGNOSIS — M4317 Spondylolisthesis, lumbosacral region: Secondary | ICD-10-CM | POA: Insufficient documentation

## 2021-05-03 ENCOUNTER — Telehealth: Payer: Self-pay | Admitting: Urology

## 2021-05-03 NOTE — Telephone Encounter (Signed)
Pt would like to know if she could try samples of Gemtesa. Would like a call back if possible.

## 2021-05-10 ENCOUNTER — Ambulatory Visit (INDEPENDENT_AMBULATORY_CARE_PROVIDER_SITE_OTHER): Payer: Medicare Other | Admitting: Internal Medicine

## 2021-05-10 ENCOUNTER — Other Ambulatory Visit: Payer: Self-pay

## 2021-05-10 ENCOUNTER — Ambulatory Visit
Admission: RE | Admit: 2021-05-10 | Discharge: 2021-05-10 | Disposition: A | Discharge status: Home / Self Care | Payer: Medicare Other | Source: Ambulatory Visit | Attending: Internal Medicine | Admitting: Internal Medicine

## 2021-05-10 VITALS — BP 124/76 | HR 90 | Temp 97.9°F | Ht 64.0 in | Wt 202.0 lb

## 2021-05-10 DIAGNOSIS — I10 Essential (primary) hypertension: Secondary | ICD-10-CM

## 2021-05-10 DIAGNOSIS — Z86010 Personal history of colonic polyps: Secondary | ICD-10-CM

## 2021-05-10 DIAGNOSIS — E7800 Pure hypercholesterolemia, unspecified: Secondary | ICD-10-CM | POA: Diagnosis not present

## 2021-05-10 DIAGNOSIS — Z1231 Encounter for screening mammogram for malignant neoplasm of breast: Secondary | ICD-10-CM | POA: Diagnosis present

## 2021-05-10 DIAGNOSIS — M810 Age-related osteoporosis without current pathological fracture: Secondary | ICD-10-CM | POA: Diagnosis not present

## 2021-05-10 DIAGNOSIS — E1165 Type 2 diabetes mellitus with hyperglycemia: Secondary | ICD-10-CM | POA: Diagnosis not present

## 2021-05-10 DIAGNOSIS — D329 Benign neoplasm of meninges, unspecified: Secondary | ICD-10-CM

## 2021-05-10 DIAGNOSIS — K219 Gastro-esophageal reflux disease without esophagitis: Secondary | ICD-10-CM

## 2021-05-10 DIAGNOSIS — Z1211 Encounter for screening for malignant neoplasm of colon: Secondary | ICD-10-CM

## 2021-05-10 DIAGNOSIS — Z0000 Encounter for general adult medical examination without abnormal findings: Secondary | ICD-10-CM

## 2021-05-10 LAB — CBC WITH DIFFERENTIAL/PLATELET
Basophils Absolute: 0.1 10*3/uL (ref 0.0–0.1)
Basophils Relative: 1.2 % (ref 0.0–3.0)
Eosinophils Absolute: 0.3 10*3/uL (ref 0.0–0.7)
Eosinophils Relative: 5.5 % — ABNORMAL HIGH (ref 0.0–5.0)
HCT: 39.8 % (ref 36.0–46.0)
Hemoglobin: 13 g/dL (ref 12.0–15.0)
Lymphocytes Relative: 25.6 % (ref 12.0–46.0)
Lymphs Abs: 1.2 10*3/uL (ref 0.7–4.0)
MCHC: 32.6 g/dL (ref 30.0–36.0)
MCV: 97.9 fl (ref 78.0–100.0)
Monocytes Absolute: 0.4 10*3/uL (ref 0.1–1.0)
Monocytes Relative: 8.9 % (ref 3.0–12.0)
Neutro Abs: 2.7 10*3/uL (ref 1.4–7.7)
Neutrophils Relative %: 58.8 % (ref 43.0–77.0)
Platelets: 258 10*3/uL (ref 150.0–400.0)
RBC: 4.07 Mil/uL (ref 3.87–5.11)
RDW: 12.7 % (ref 11.5–15.5)
WBC: 4.6 10*3/uL (ref 4.0–10.5)

## 2021-05-10 LAB — HEPATIC FUNCTION PANEL
ALT: 26 U/L (ref 0–35)
AST: 22 U/L (ref 0–37)
Albumin: 4.3 g/dL (ref 3.5–5.2)
Alkaline Phosphatase: 64 U/L (ref 39–117)
Bilirubin, Direct: 0.1 mg/dL (ref 0.0–0.3)
Total Bilirubin: 0.5 mg/dL (ref 0.2–1.2)
Total Protein: 7.2 g/dL (ref 6.0–8.3)

## 2021-05-10 LAB — LIPID PANEL
Cholesterol: 201 mg/dL — ABNORMAL HIGH (ref 0–200)
HDL: 78.1 mg/dL (ref >39.00)
LDL Cholesterol: 89 mg/dL (ref 0–99)
NonHDL: 123.15
Total CHOL/HDL Ratio: 3
Triglycerides: 169 mg/dL — ABNORMAL HIGH (ref 0.0–149.0)
VLDL: 33.8 mg/dL (ref 0.0–40.0)

## 2021-05-10 LAB — BASIC METABOLIC PANEL
BUN: 16 mg/dL (ref 6–23)
CO2: 30 mEq/L (ref 19–32)
Calcium: 9.5 mg/dL (ref 8.4–10.5)
Chloride: 103 mEq/L (ref 96–112)
Creatinine, Ser: 0.91 mg/dL (ref 0.40–1.20)
GFR: 60.06 mL/min (ref >60.00)
Glucose, Bld: 116 mg/dL — ABNORMAL HIGH (ref 70–99)
Potassium: 4.8 mEq/L (ref 3.5–5.1)
Sodium: 141 mEq/L (ref 135–145)

## 2021-05-10 LAB — VITAMIN D 25 HYDROXY (VIT D DEFICIENCY, FRACTURES): VITD: 48.13 ng/mL (ref 30.00–100.00)

## 2021-05-10 LAB — MICROALBUMIN / CREATININE URINE RATIO
Creatinine,U: 179.4 mg/dL
Microalb Creat Ratio: 0.5 mg/g (ref 0.0–30.0)
Microalb, Ur: 1 mg/dL (ref 0.0–1.9)

## 2021-05-10 LAB — HEMOGLOBIN A1C: Hgb A1c MFr Bld: 6.3 % (ref 4.6–6.5)

## 2021-05-10 LAB — HM DIABETES FOOT EXAM

## 2021-05-10 LAB — TSH: TSH: 2.87 u[IU]/mL (ref 0.35–5.50)

## 2021-05-10 NOTE — Progress Notes (Signed)
[name], female   DOB: 03-Jul-1940, 80 y.o.   MRN: [id number]   Subjective:    [name], female    DOB: [birth year], 80 y.o.   MRN: [id number]  This visit occurred during the SARS-CoV-2 public health emergency.  Safety protocols were in place, including screening questions prior to the visit, additional usage of staff PPE, and extensive cleaning of exam room while observing appropriate contact time as indicated for disinfecting solutions.    HPI With history of diabetes, elevated cholesterol and back pain.  Comes in today to follow up on these issues as well as for a complete physical exam.  Increased back pain.  Saw Dr Lacinda Axon.  MRI and note reviewed.  Discussed fusion.  Bone density - osteoporosis.  They had discussed referral to endocrinology.  Discussed treatment options with her today.  She desires prolia injections.  Will start PA process.  No chest pain or sob reported.  No abdominal pain or bowel change reported.  Back pain limits her activity.  Discussed exercise.  Due colonoscopy.  Mammogram scheduled today.     Past Medical History:  Diagnosis Date   Fibrocystic breast disease    Gastritis    EGD - schatzki's ring, gastritis and gastric polyps   History of colon polyps    Hypercholesterolemia    Hypertension    MAI (mycobacterium avium-intracellulare) infection ([place])    Osteoporosis    Reflux esophagitis    Past Surgical History:  Procedure Laterality Date   ABDOMINAL HYSTERECTOMY  1998   bladder tact     BREAST EXCISIONAL BIOPSY Bilateral 1970's   benign   BREAST EXCISIONAL BIOPSY Bilateral 2010   pt  stated dr Tamala Julian did bilat axilla lumps neg    BREAST SURGERY     cyst(benign)   COLONOSCOPY N/A 09/28/2014   Procedure: COLONOSCOPY;  Surgeon: Manya Silvas, MD;  Location: [place];  Service: Endoscopy;  Laterality: N/A;   ESOPHAGOGASTRODUODENOSCOPY N/A 09/28/2014   Procedure: ESOPHAGOGASTRODUODENOSCOPY (EGD);  Surgeon: Manya Silvas, MD;  Location[facility] ENDOSCOPY;  Service: Endoscopy;  Laterality: N/A;   ESOPHAGOGASTRODUODENOSCOPY (EGD) WITH PROPOFOL N/A 04/26/2016   Procedure: ESOPHAGOGASTRODUODENOSCOPY (EGD) WITH PROPOFOL;  Surgeon: Manya Silvas, MD;  Location: [facility] ENDOSCOPY;  Service: Endoscopy;  Laterality: N/A;   excision of right axillary lipoma  10/07   FOOT SURGERY Right    HEMORRHOID SURGERY     HERNIA REPAIR  10/07   NASAL SEPTUM SURGERY     Family History  Problem Relation Age of Onset   Heart disease Mother 8       heart attack   Cancer Father        lung   Diabetes Sister    Breast cancer Sister 6   Breast cancer Daughter 35   Stroke Brother    Diabetes Brother    Social History   Socioeconomic History   Marital status: Widowed    Spouse name: Not on file   Number of children: Not on file   Years of education: Not on file   Highest education level: Not on file  Occupational History   Not on file  Tobacco Use   Smoking status: Never   Smokeless tobacco: Never   Tobacco comments:    Never  Vaping Use   Vaping Use: Never used  Substance and Sexual Activity   Alcohol use: No    Alcohol/week: 0.0 standard drinks   Drug use: No   Sexual  activity: Not on file  Other Topics Concern   Not on file  Social History Narrative   Not on file   Social Determinants of Health   Financial Resource Strain: Not on file  Food Insecurity: Not on file  Transportation Needs: Not on file  Physical Activity: Not on file  Stress: Not on file  Social Connections: Not on file     Review of Systems  Constitutional:  Negative for appetite change, fever and unexpected weight change.  HENT:  Negative for congestion, sinus pressure and sore throat.   Eyes:  Negative for pain and visual disturbance.  Respiratory:  Negative for cough, chest tightness and shortness of breath.   Cardiovascular:  Negative for chest pain, palpitations and leg swelling.  Gastrointestinal:  Negative for  abdominal pain, diarrhea, nausea and vomiting.  Genitourinary:  Negative for difficulty urinating and dysuria.  Musculoskeletal:  Negative for joint swelling and myalgias.  Skin:  Negative for color change and rash.  Neurological:  Negative for dizziness, light-headedness and headaches.  Hematological:  Negative for adenopathy. Does not bruise/bleed easily.  Psychiatric/Behavioral:  Negative for agitation and dysphoric mood.       Objective:     BP 124/76    Pulse 90    Temp 97.9 F (36.6 C)    Ht 5' 4" (1.626 m)    Wt 202 lb (91.6 kg)    BMI 34.67 kg/m  Wt Readings from Last 3 Encounters:  05/17/21 202 lb (91.6 kg)  12/16/20 200 lb 6.4 oz (90.9 kg)  11/04/20 202 lb 12.8 oz (92 kg)    Physical Exam Vitals reviewed.  Constitutional:      General: She is not in acute distress.    Appearance: Normal appearance. She is well-developed.  HENT:     Head: Normocephalic and atraumatic.     Right Ear: External ear normal.     Left Ear: External ear normal.  Eyes:     General: No scleral icterus.       Right eye: No discharge.        Left eye: No discharge.     Conjunctiva/sclera: Conjunctivae normal.  Neck:     Thyroid: No thyromegaly.  Cardiovascular:     Rate and Rhythm: Normal rate and regular rhythm.  Pulmonary:     Effort: No tachypnea, accessory muscle usage or respiratory distress.     Breath sounds: Normal breath sounds. No decreased breath sounds or wheezing.  Chest:  Breasts:    Right: No inverted nipple, mass, nipple discharge or tenderness (no axillary adenopathy).     Left: No inverted nipple, mass, nipple discharge or tenderness (no axilarry adenopathy).  Abdominal:     General: Bowel sounds are normal.     Palpations: Abdomen is soft.     Tenderness: There is no abdominal tenderness.  Musculoskeletal:        General: No swelling or tenderness.     Cervical back: Neck supple.  Lymphadenopathy:     Cervical: No cervical adenopathy.  Skin:    Findings: No  erythema or rash.  Neurological:     Mental Status: She is alert and oriented to person, place, and time.  Psychiatric:        Mood and Affect: Mood normal.        Behavior: Behavior normal.     Outpatient Encounter Medications as of 05/10/2021  Medication Sig   albuterol (VENTOLIN HFA) 108 (90 Base) MCG/ACT inhaler Inhale 2 puffs into the lungs  every 6 (six) hours as needed for wheezing or shortness of breath.   aspirin 81 MG EC tablet Take 81 mg by mouth daily. Swallow whole.   fluticasone furoate-vilanterol (BREO ELLIPTA) 100-25 MCG/INH AEPB Inhale 1 puff into the lungs daily.   metoprolol succinate (TOPROL-XL) 25 MG 24 hr tablet Take 1 tablet (25 mg total) by mouth 2 (two) times daily.   montelukast (SINGULAIR) 10 MG tablet Take 1 tablet (10 mg total) by mouth at bedtime.   Multiple Vitamin (MULTIVITAMIN WITH MINERALS) TABS Take 1 tablet by mouth daily.   Omega-3 Fatty Acids (FISH OIL) 1000 MG CAPS Take 3,000 mg by mouth daily.   pantoprazole (PROTONIX) 20 MG tablet TAKE 1 TABLET BY MOUTH TWICE A DAY   rosuvastatin (CRESTOR) 10 MG tablet Take 1 tablet (10 mg total) by mouth daily.   No facility-administered encounter medications on file as of 05/10/2021.     Lab Results  Component Value Date   WBC 4.6 05/10/2021   HGB 13.0 05/10/2021   HCT 39.8 05/10/2021   PLT 258.0 05/10/2021   GLUCOSE 116 (H) 05/10/2021   CHOL 201 (H) 05/10/2021   TRIG 169.0 (H) 05/10/2021   HDL 78.10 05/10/2021   LDLCALC 89 05/10/2021   ALT 26 05/10/2021   AST 22 05/10/2021   NA 141 05/10/2021   K 4.8 05/10/2021   CL 103 05/10/2021   CREATININE 0.91 05/10/2021   BUN 16 05/10/2021   CO2 30 05/10/2021   TSH 2.87 05/10/2021   HGBA1C 6.3 05/10/2021   MICROALBUR 1.0 05/10/2021    DG Lumbar Spine 2-3 Views  Result Date: 03/23/2021 CLINICAL DATA:  Back pain EXAM: LUMBAR SPINE - 2-3 VIEW COMPARISON:  11/04/2020 FINDINGS: Degenerative changes are noted in facet joints, more so at L5-S1 level along  with disc space narrowing. Osteopenia is seen in bony structures. The views appear to be done in neutral, flexion and extension positions. The images are not labeled limiting evaluation as to the maximum anterolisthesis. No recent fracture is seen. IMPRESSION: There is first-degree anterolisthesis at L5-S1 level measuring up to 11 mm. Degenerative changes are noted with disc space narrowing and facet hypertrophy at L5-S1 level. Electronically Signed   By: Elmer Picker M.D.   On: 03/23/2021 15:56   DG SCOLIOSIS EVAL COMPLETE SPINE 2 OR 3 VIEWS  Result Date: 03/23/2021 CLINICAL DATA:  Scoliosis EXAM: DG SCOLIOSIS EVAL COMPLETE SPINE 2-3V COMPARISON:  None. FINDINGS: There is 24 degrees levoscoliosis centered at T12 level. There is 18 degrees levoscoliosis centered at T1 level. Osteopenia is seen in bony structures. Low position of diaphragms suggests COPD. There is moderate to large fixed hiatal hernia. Bowel gas pattern in the abdomen is unremarkable. There is arthroplasty in the left shoulder. No recent fracture is seen. IMPRESSION: There is 24 degrees levoscoliosis centered at T12 level. There is 18 degrees levoscoliosis centered at T1 level. Other findings as described in the body of the report. Electronically Signed   By: Elmer Picker M.D.   On: 03/23/2021 15:50       Assessment & Plan:   Problem List Items Addressed This Visit     Essential hypertension, benign - Primary    Blood pressure doing well. On metoprolol.  Follow pressures.follow metabolic panel.       Relevant Orders   CBC with Differential/Platelet (Completed)   Basic metabolic panel (Completed)   TSH (Completed)   GERD (gastroesophageal reflux disease)    No upper symptoms reported now.  Previously had  reflux when off protonix for short period of time.  Continue protonix.  F/u with GI as planned.        Health care maintenance    Physical today 05/10/21.  Mammogram 05/10/21 - Birads I.  Colonoscopy 09/2014 -  internal hemorrhoids.  Recommended f/u in 5 years.  Planning for prolia.        History of colonic polyps    Per review, colonoscopy May 2016.  Recommended follow-up in 5 years. Discussed today.  Agreeable to GI referral.       Relevant Orders   Ambulatory referral to Gastroenterology   Hypercholesterolemia    Continue Crestor.  Low-cholesterol diet and exercise.  Follow-up fast lipid profile liver panel.      Relevant Orders   Hepatic function panel (Completed)   Lipid panel (Completed)   Meningioma ([place])    Last evaluation with neurology, they reviewed MRI and felt stable.  No follow-up recommended.      Osteoporosis    Known osteoporosis.  Was referred to endocrinology by [facility].  Unable to get an appt until 10/2021.  Desires to go ahead and start treatment.  Discussed treatment options.  Wants to pursue prolia.  Will check labs and start PA process.        Relevant Orders   VITAMIN D 25 Hydroxy (Vit-D Deficiency, Fractures) (Completed)   Type 2 diabetes mellitus with hyperglycemia (HCC)    Low carb diet and exercise.  Follow met b and a1c. Scheduled to get eyes checked next week.       Relevant Orders   Hemoglobin A1c (Completed)   Microalbumin / creatinine urine ratio (Completed)   Other Visit Diagnoses     Colon cancer screening       Relevant Orders   Ambulatory referral to Gastroenterology        Einar Pheasant, MD

## 2021-05-13 ENCOUNTER — Telehealth: Payer: Self-pay

## 2021-05-13 NOTE — Telephone Encounter (Signed)
See result note.  

## 2021-05-13 NOTE — Telephone Encounter (Signed)
Patient called in and stated returning call to Viola back, Please have Larena Glassman to call patient @ [phone]

## 2021-05-13 NOTE — Telephone Encounter (Signed)
Pt returning call

## 2021-05-13 NOTE — Telephone Encounter (Signed)
LMTCB regarding labs 

## 2021-05-17 ENCOUNTER — Encounter: Payer: Self-pay | Admitting: Internal Medicine

## 2021-05-17 NOTE — Assessment & Plan Note (Signed)
Known osteoporosis.  Was referred to endocrinology by [facility].  Unable to get an appt until 10/2021.  Desires to go ahead and start treatment.  Discussed treatment options.  Wants to pursue prolia.  Will check labs and start PA process.

## 2021-05-17 NOTE — Assessment & Plan Note (Signed)
Continue Crestor.  Low-cholesterol diet and exercise.  Follow-up fast lipid profile liver panel. 

## 2021-05-17 NOTE — Assessment & Plan Note (Signed)
Physical today 05/10/21.  Mammogram 05/10/21 - Birads I.  Colonoscopy 09/2014 - internal hemorrhoids.  Recommended f/u in 5 years.  Planning for prolia.

## 2021-05-17 NOTE — Assessment & Plan Note (Signed)
Last evaluation with neurology, they reviewed MRI and felt stable.  No follow-up recommended. 

## 2021-05-17 NOTE — Assessment & Plan Note (Signed)
No upper symptoms reported now.  Previously had reflux when off protonix for short period of time.  Continue protonix.  F/u with GI as planned.

## 2021-05-17 NOTE — Assessment & Plan Note (Signed)
Per review, colonoscopy May 2016.  Recommended follow-up in 5 years. Discussed today.  Agreeable to GI referral.

## 2021-05-17 NOTE — Assessment & Plan Note (Signed)
Blood pressure doing well. On metoprolol.  Follow pressures.follow metabolic panel.

## 2021-05-17 NOTE — Assessment & Plan Note (Addendum)
Low carb diet and exercise.  Follow met b and a1c. Scheduled to get eyes checked next week.  °

## 2021-05-17 NOTE — Telephone Encounter (Addendum)
As Per Dr. Matilde Sprang last note, did not start pt on this medication due to side effects of myrbetriq.

## 2021-05-18 ENCOUNTER — Other Ambulatory Visit: Payer: Self-pay | Admitting: Internal Medicine

## 2021-05-18 ENCOUNTER — Telehealth: Payer: Self-pay | Admitting: Internal Medicine

## 2021-05-18 NOTE — Telephone Encounter (Signed)
She was seen by neurosurgery.  They ordered bone density - osteoporosis.  Discussed with her.  She is wanting to start prolia injections.  Had recent labs.  Let me know what I need to do to get this started.  Thanks

## 2021-05-20 NOTE — Telephone Encounter (Signed)
Lm for pt to return my call.

## 2021-05-23 NOTE — Telephone Encounter (Signed)
Spoke with patient. Pt verbalized understanding  °

## 2021-05-26 ENCOUNTER — Other Ambulatory Visit: Payer: Self-pay

## 2021-05-26 ENCOUNTER — Encounter: Payer: Self-pay | Admitting: Ophthalmology

## 2021-06-01 NOTE — Telephone Encounter (Signed)
Noted.  Thank you.  Calcium and vitamin D 05/10/21 - wnl.

## 2021-06-01 NOTE — Telephone Encounter (Signed)
FYI only: Patient approved at 0 co-pay per [employer], patient notified and will receive first injection 06/02/21 calcium verified.

## 2021-06-02 ENCOUNTER — Other Ambulatory Visit: Payer: Self-pay

## 2021-06-02 ENCOUNTER — Ambulatory Visit (INDEPENDENT_AMBULATORY_CARE_PROVIDER_SITE_OTHER): Payer: Medicare Other

## 2021-06-02 DIAGNOSIS — M810 Age-related osteoporosis without current pathological fracture: Secondary | ICD-10-CM | POA: Diagnosis not present

## 2021-06-02 MED ORDER — DENOSUMAB 60 MG/ML ~~LOC~~ SOSY
60.0000 mg | PREFILLED_SYRINGE | Freq: Once | SUBCUTANEOUS | Status: AC
  Administered 2021-06-02: 60 mg via SUBCUTANEOUS

## 2021-06-02 NOTE — Progress Notes (Signed)
Angela Cohen presents today for injection per MD orders. Prolia administered SQ in right Upper Arm. Administration without incident. Patient tolerated well. Blaise Palladino,cma

## 2021-06-10 DIAGNOSIS — Z90710 Acquired absence of both cervix and uterus: Secondary | ICD-10-CM | POA: Insufficient documentation

## 2021-06-10 DIAGNOSIS — E785 Hyperlipidemia, unspecified: Secondary | ICD-10-CM | POA: Insufficient documentation

## 2021-06-10 DIAGNOSIS — I10 Essential (primary) hypertension: Secondary | ICD-10-CM | POA: Insufficient documentation

## 2021-06-10 NOTE — Discharge Instructions (Signed)

## 2021-06-13 ENCOUNTER — Other Ambulatory Visit: Payer: Self-pay

## 2021-06-13 ENCOUNTER — Encounter: Payer: Self-pay | Admitting: Ophthalmology

## 2021-06-13 ENCOUNTER — Ambulatory Visit: Payer: Medicare Other | Admitting: Anesthesiology

## 2021-06-13 ENCOUNTER — Encounter
Admission: RE | Disposition: A | Discharge status: Home / Self Care | Payer: Self-pay | Source: Home / Self Care | Attending: Ophthalmology

## 2021-06-13 ENCOUNTER — Ambulatory Visit
Admission: RE | Admit: 2021-06-13 | Discharge: 2021-06-13 | Disposition: A | Discharge status: Home / Self Care | Payer: Medicare Other | Attending: Ophthalmology | Admitting: Ophthalmology

## 2021-06-13 DIAGNOSIS — K219 Gastro-esophageal reflux disease without esophagitis: Secondary | ICD-10-CM | POA: Insufficient documentation

## 2021-06-13 DIAGNOSIS — H2512 Age-related nuclear cataract, left eye: Secondary | ICD-10-CM | POA: Diagnosis present

## 2021-06-13 DIAGNOSIS — J45909 Unspecified asthma, uncomplicated: Secondary | ICD-10-CM | POA: Diagnosis not present

## 2021-06-13 DIAGNOSIS — I10 Essential (primary) hypertension: Secondary | ICD-10-CM | POA: Insufficient documentation

## 2021-06-13 HISTORY — DX: Unspecified asthma, uncomplicated: J45.909

## 2021-06-13 HISTORY — PX: CATARACT EXTRACTION W/PHACO: SHX586

## 2021-06-13 SURGERY — PHACOEMULSIFICATION, CATARACT, WITH IOL INSERTION
Anesthesia: Monitor Anesthesia Care | Site: Eye | Laterality: Left

## 2021-06-13 MED ORDER — ARMC OPHTHALMIC DILATING DROPS
1.0000 "application " | OPHTHALMIC | Status: DC | PRN
  Administered 2021-06-13 (×3): 1 via OPHTHALMIC

## 2021-06-13 MED ORDER — MIDAZOLAM HCL 2 MG/2ML IJ SOLN
INTRAMUSCULAR | Status: DC | PRN
  Administered 2021-06-13: 1 mg via INTRAVENOUS

## 2021-06-13 MED ORDER — TETRACAINE HCL 0.5 % OP SOLN
1.0000 [drp] | OPHTHALMIC | Status: DC | PRN
  Administered 2021-06-13 (×3): 1 [drp] via OPHTHALMIC

## 2021-06-13 MED ORDER — SIGHTPATH DOSE#1 SODIUM HYALURONATE 10 MG/ML IO SOLUTION
PREFILLED_SYRINGE | INTRAOCULAR | Status: DC | PRN
  Administered 2021-06-13: 0.85 mL via INTRAOCULAR

## 2021-06-13 MED ORDER — LIDOCAINE HCL (PF) 2 % IJ SOLN
INTRAOCULAR | Status: DC | PRN
  Administered 2021-06-13: 1 mL via INTRAOCULAR

## 2021-06-13 MED ORDER — SIGHTPATH DOSE#1 SODIUM HYALURONATE 23 MG/ML IO SOLUTION
PREFILLED_SYRINGE | INTRAOCULAR | Status: DC | PRN
  Administered 2021-06-13: 0.6 mL via INTRAOCULAR

## 2021-06-13 MED ORDER — SIGHTPATH DOSE#1 BSS IO SOLN
INTRAOCULAR | Status: DC | PRN
  Administered 2021-06-13: 15 mL

## 2021-06-13 MED ORDER — LACTATED RINGERS IV SOLN: INTRAVENOUS | Status: DC

## 2021-06-13 MED ORDER — FENTANYL CITRATE (PF) 100 MCG/2ML IJ SOLN
INTRAMUSCULAR | Status: DC | PRN
  Administered 2021-06-13: 50 ug via INTRAVENOUS

## 2021-06-13 MED ORDER — MOXIFLOXACIN HCL 0.5 % OP SOLN
OPHTHALMIC | Status: DC | PRN
  Administered 2021-06-13: 0.2 mL via OPHTHALMIC

## 2021-06-13 MED ORDER — SIGHTPATH DOSE#1 BSS IO SOLN
INTRAOCULAR | Status: DC | PRN
  Administered 2021-06-13: 73 mL via OPHTHALMIC

## 2021-06-13 SURGICAL SUPPLY — 20 items
CANNULA ANT/CHMB 27G (MISCELLANEOUS) IMPLANT
CANNULA ANT/CHMB 27GA (MISCELLANEOUS) IMPLANT
CATARACT SUITE SIGHTPATH (MISCELLANEOUS) ×2 IMPLANT
DISSECTOR HYDRO NUCLEUS 50X22 (MISCELLANEOUS) ×2 IMPLANT
FEE CATARACT SUITE SIGHTPATH (MISCELLANEOUS) ×1 IMPLANT
GLOVE SURG GAMMEX PI TX LF 7.5 (GLOVE) ×2 IMPLANT
GLOVE SURG SYN 8.5  E (GLOVE) ×1
GLOVE SURG SYN 8.5 E (GLOVE) ×1 IMPLANT
GLOVE SURG SYN 8.5 PF PI (GLOVE) ×1 IMPLANT
LENS IOL TECNIS EYHANCE 20.0 (Intraocular Lens) ×1 IMPLANT
NDL FILTER BLUNT 18X1 1/2 (NEEDLE) ×1 IMPLANT
NEEDLE FILTER BLUNT 18X 1/2SAF (NEEDLE) ×1
NEEDLE FILTER BLUNT 18X1 1/2 (NEEDLE) ×1 IMPLANT
PACK VIT ANT 23G (MISCELLANEOUS) IMPLANT
RING MALYGIN (MISCELLANEOUS) IMPLANT
SUT ETHILON 10-0 CS-B-6CS-B-6 (SUTURE)
SUTURE EHLN 10-0 CS-B-6CS-B-6 (SUTURE) IMPLANT
SYR 3ML LL SCALE MARK (SYRINGE) ×2 IMPLANT
SYR 5ML LL (SYRINGE) ×2 IMPLANT
WATER STERILE IRR 250ML POUR (IV SOLUTION) ×2 IMPLANT

## 2021-06-13 NOTE — H&P (Signed)
[facility]   Primary Care Physician:  Einar Pheasant, MD Ophthalmologist: Dr. Benay Pillow  Pre-Procedure History & Physical: HPI:  Angela Cohen is a 81 y.o. female here for cataract surgery.   Past Medical History:  Diagnosis Date   Asthma    Fibrocystic breast disease    Gastritis    EGD - schatzki's ring, gastritis and gastric polyps   History of colon polyps    Hypercholesterolemia    Hypertension    MAI (mycobacterium avium-intracellulare) infection ([place])    Osteoporosis    Reflux esophagitis     Past Surgical History:  Procedure Laterality Date   ABDOMINAL HYSTERECTOMY  1998   bladder tact     BREAST EXCISIONAL BIOPSY Bilateral 1970's   benign   BREAST EXCISIONAL BIOPSY Bilateral 2010   pt  stated dr Tamala Julian did bilat axilla lumps neg    BREAST SURGERY     cyst(benign)   COLONOSCOPY N/A 09/28/2014   Procedure: COLONOSCOPY;  Surgeon: Manya Silvas, MD;  Location: [place];  Service: Endoscopy;  Laterality: N/A;   ESOPHAGOGASTRODUODENOSCOPY N/A 09/28/2014   Procedure: ESOPHAGOGASTRODUODENOSCOPY (EGD);  Surgeon: Manya Silvas, MD;  Location: [facility] ENDOSCOPY;  Service: Endoscopy;  Laterality: N/A;   ESOPHAGOGASTRODUODENOSCOPY (EGD) WITH PROPOFOL N/A 04/26/2016   Procedure: ESOPHAGOGASTRODUODENOSCOPY (EGD) WITH PROPOFOL;  Surgeon: Manya Silvas, MD;  Location: [facility] ENDOSCOPY;  Service: Endoscopy;  Laterality: N/A;   excision of right axillary lipoma  10/07   FOOT SURGERY Right    HEMORRHOID SURGERY     HERNIA REPAIR  10/07   NASAL SEPTUM SURGERY      Prior to Admission medications   Medication Sig Start Date End Date Taking? Authorizing Provider  albuterol (VENTOLIN HFA) 108 (90 Base) MCG/ACT inhaler Inhale 2 puffs into the lungs every 6 (six) hours as needed for wheezing or shortness of breath. 01/18/21  Yes Tyler Pita, MD  aspirin 81 MG EC tablet Take 81 mg by mouth daily. Swallow whole.   Yes [provider]  fluticasone  furoate-vilanterol (BREO ELLIPTA) 100-25 MCG/INH AEPB Inhale 1 puff into the lungs daily. 12/16/20  Yes Tyler Pita, MD  Fluticasone-Umeclidin-Vilant (TRELEGY ELLIPTA IN) Inhale into the lungs.   Yes [provider]  metoprolol succinate (TOPROL-XL) 25 MG 24 hr tablet Take 1 tablet (25 mg total) by mouth 2 (two) times daily. 02/28/21  Yes Einar Pheasant, MD  montelukast (SINGULAIR) 10 MG tablet TAKE 1 TABLET BY MOUTH EVERYDAY AT BEDTIME 05/18/21  Yes Einar Pheasant, MD  Multiple Vitamin (MULTIVITAMIN WITH MINERALS) TABS Take 1 tablet by mouth daily.   Yes [provider]  Omega-3 Fatty Acids (FISH OIL) 1000 MG CAPS Take 3,000 mg by mouth daily. 12/29/08  Yes [provider]  pantoprazole (PROTONIX) 20 MG tablet TAKE 1 TABLET BY MOUTH TWICE A DAY 01/19/21  Yes Einar Pheasant, MD  rosuvastatin (CRESTOR) 10 MG tablet Take 1 tablet (10 mg total) by mouth daily. 11/05/20  Yes Einar Pheasant, MD    Allergies as of 05/18/2021   (No Known Allergies)    Family History  Problem Relation Age of Onset   Heart disease Mother 20       heart attack   Cancer Father        lung   Diabetes Sister    Breast cancer Sister 45   Breast cancer Daughter 60   Stroke Brother    Diabetes Brother     Social History   Socioeconomic History  Marital status: Widowed    Spouse name: Not on file   Number of children: Not on file   Years of education: Not on file   Highest education level: Not on file  Occupational History   Not on file  Tobacco Use   Smoking status: Never   Smokeless tobacco: Never   Tobacco comments:    Never  Vaping Use   Vaping Use: Never used  Substance and Sexual Activity   Alcohol use: No    Alcohol/week: 0.0 standard drinks   Drug use: No   Sexual activity: Not on file  Other Topics Concern   Not on file  Social History Narrative   Not on file   Social Determinants of Health   Financial Resource Strain: Not on file  Food Insecurity: Not  on file  Transportation Needs: Not on file  Physical Activity: Not on file  Stress: Not on file  Social Connections: Not on file  Intimate Partner Violence: Not on file    Review of Systems: See HPI, otherwise negative ROS  Physical Exam: BP (!) 165/93    Pulse 85    Temp 97.9 F (36.6 C) (Temporal)    Resp 20    Ht 5\' 4"  (1.626 m)    Wt 93.4 kg    SpO2 98%    BMI 35.36 kg/m  General:   Alert, cooperative in NAD Head:  Normocephalic and atraumatic. Respiratory:  Normal work of breathing. Cardiovascular:  RRR  Impression/Plan: Angela Cohen is here for cataract surgery.  Risks, benefits, limitations, and alternatives regarding cataract surgery have been reviewed with the patient.  Questions have been answered.  All parties agreeable.   Benay Pillow, MD  06/13/2021, 11:32 AM

## 2021-06-13 NOTE — Transfer of Care (Signed)
Immediate Anesthesia Transfer of Care Note  Patient: Angela Cohen  Procedure(s) Performed: CATARACT EXTRACTION PHACO AND INTRAOCULAR LENS PLACEMENT (IOC) LEFT (Left: Eye)  Patient Location: PACU  Anesthesia Type: MAC  Level of Consciousness: awake, alert  and patient cooperative  Airway and Oxygen Therapy: Patient Spontanous Breathing and Patient connected to supplemental oxygen  Post-op Assessment: Post-op Vital signs reviewed, Patient's Cardiovascular Status Stable, Respiratory Function Stable, Patent Airway and No signs of Nausea or vomiting  Post-op Vital Signs: Reviewed and stable  Complications: No notable events documented.

## 2021-06-13 NOTE — Op Note (Signed)
OPERATIVE NOTE  Angela Cohen [id number] 06/13/2021   PREOPERATIVE DIAGNOSIS:  Nuclear sclerotic cataract left eye.  H25.12   POSTOPERATIVE DIAGNOSIS:    Nuclear sclerotic cataract left eye.     PROCEDURE:  Phacoemusification with posterior chamber intraocular lens placement of the left eye   LENS:   Implant Name Type Inv. Item Serial No. Manufacturer Lot No. LRB No. Used Action  LENS IOL TECNIS EYHANCE 20.0 - [id number] Intraocular Lens LENS IOL TECNIS EYHANCE 20.0 [id number] [employer]  Left 1 Implanted      Procedure(s) with comments: CATARACT EXTRACTION PHACO AND INTRAOCULAR LENS PLACEMENT (IOC) LEFT (Left) - 7.04 0:40.5  DIB00 +20.0   ULTRASOUND TIME: 0 minutes 40 seconds.  CDE 7.04   SURGEON:  Benay Pillow, MD, MPH   ANESTHESIA:  Topical with tetracaine drops augmented with 1% preservative-free intracameral lidocaine.  ESTIMATED BLOOD LOSS: <1 mL   COMPLICATIONS:  None.   DESCRIPTION OF PROCEDURE:  The patient was identified in the holding room and transported to the operating room and placed in the supine position under the operating microscope.  The left eye was identified as the operative eye and it was prepped and draped in the usual sterile ophthalmic fashion.   A 1.0 millimeter clear-corneal paracentesis was made at the 5:00 position. 0.5 ml of preservative-free 1% lidocaine with epinephrine was injected into the anterior chamber.  The anterior chamber was filled with Healon 5 viscoelastic.  A 2.4 millimeter keratome was used to make a near-clear corneal incision at the 2:00 position.  A curvilinear capsulorrhexis was made with a cystotome and capsulorrhexis forceps.  Balanced salt solution was used to hydrodissect and hydrodelineate the nucleus.   Phacoemulsification was then used in stop and chop fashion to remove the lens nucleus and epinucleus.  The remaining cortex was then removed using the irrigation and aspiration handpiece. Healon was then placed into the  capsular bag to distend it for lens placement.  A lens was then injected into the capsular bag.  The remaining viscoelastic was aspirated.   Wounds were hydrated with balanced salt solution.  The anterior chamber was inflated to a physiologic pressure with balanced salt solution.  Intracameral vigamox 0.1 mL undiltued was injected into the eye and a drop placed onto the ocular surface.  No wound leaks were noted.  The patient was taken to the recovery room in stable condition without complications of anesthesia or surgery  Benay Pillow 06/13/2021, 11:58 AM

## 2021-06-13 NOTE — Anesthesia Preprocedure Evaluation (Signed)
Anesthesia Evaluation  Patient identified by MRN, date of birth, ID band Patient awake    Reviewed: Allergy & Precautions, H&P , NPO status , Patient's Chart, lab work & pertinent test results  Airway Mallampati: II  TM Distance: >3 FB Neck ROM: full    Dental no notable dental hx.    Pulmonary asthma ,    Pulmonary exam normal breath sounds clear to auscultation       Cardiovascular hypertension, Normal cardiovascular exam Rhythm:regular Rate:Normal     Neuro/Psych    GI/Hepatic GERD  ,  Endo/Other  overweight  Renal/GU      Musculoskeletal   Abdominal   Peds  Hematology   Anesthesia Other Findings   Reproductive/Obstetrics                             Anesthesia Physical Anesthesia Plan  ASA: 2  Anesthesia Plan: MAC   Post-op Pain Management: Minimal or no pain anticipated   Induction:   PONV Risk Score and Plan: 2 and Treatment may vary due to age or medical condition, TIVA and Midazolam  Airway Management Planned:   Additional Equipment:   Intra-op Plan:   Post-operative Plan:   Informed Consent: I have reviewed the patients History and Physical, chart, labs and discussed the procedure including the risks, benefits and alternatives for the proposed anesthesia with the patient or authorized representative who has indicated his/her understanding and acceptance.     Dental Advisory Given  Plan Discussed with: CRNA  Anesthesia Plan Comments:         Anesthesia Quick Evaluation

## 2021-06-13 NOTE — Anesthesia Procedure Notes (Signed)
Procedure Name: MAC Date/Time: 06/13/2021 11:39 AM Performed by: Cameron Ali, CRNA Pre-anesthesia Checklist: Patient identified, Emergency Drugs available, Suction available, Timeout performed and Patient being monitored Patient Re-evaluated:Patient Re-evaluated prior to induction Oxygen Delivery Method: Nasal cannula Placement Confirmation: positive ETCO2

## 2021-06-13 NOTE — Anesthesia Postprocedure Evaluation (Signed)
Anesthesia Post Note  Patient: Angela Cohen  Procedure(s) Performed: CATARACT EXTRACTION PHACO AND INTRAOCULAR LENS PLACEMENT (IOC) LEFT (Left: Eye)     Patient location during evaluation: PACU Anesthesia Type: MAC Level of consciousness: awake and alert and oriented Pain management: satisfactory to patient Vital Signs Assessment: post-procedure vital signs reviewed and stable Respiratory status: spontaneous breathing, nonlabored ventilation and respiratory function stable Cardiovascular status: blood pressure returned to baseline and stable Postop Assessment: Adequate PO intake and No signs of nausea or vomiting Anesthetic complications: no   No notable events documented.  Raliegh Ip

## 2021-06-14 ENCOUNTER — Encounter: Payer: Self-pay | Admitting: Ophthalmology

## 2021-06-23 NOTE — Discharge Instructions (Signed)

## 2021-06-27 ENCOUNTER — Ambulatory Visit: Payer: Medicare Other | Admitting: Anesthesiology

## 2021-06-27 ENCOUNTER — Other Ambulatory Visit: Payer: Self-pay

## 2021-06-27 ENCOUNTER — Ambulatory Visit
Admission: RE | Admit: 2021-06-27 | Discharge: 2021-06-27 | Disposition: A | Discharge status: Home / Self Care | Payer: Medicare Other | Attending: Ophthalmology | Admitting: Ophthalmology

## 2021-06-27 ENCOUNTER — Encounter
Admission: RE | Disposition: A | Discharge status: Home / Self Care | Payer: Self-pay | Source: Home / Self Care | Attending: Ophthalmology

## 2021-06-27 ENCOUNTER — Encounter: Payer: Self-pay | Admitting: Ophthalmology

## 2021-06-27 DIAGNOSIS — K219 Gastro-esophageal reflux disease without esophagitis: Secondary | ICD-10-CM | POA: Insufficient documentation

## 2021-06-27 DIAGNOSIS — J45909 Unspecified asthma, uncomplicated: Secondary | ICD-10-CM | POA: Diagnosis not present

## 2021-06-27 DIAGNOSIS — H2511 Age-related nuclear cataract, right eye: Secondary | ICD-10-CM | POA: Diagnosis not present

## 2021-06-27 DIAGNOSIS — Z6834 Body mass index (BMI) 34.0-34.9, adult: Secondary | ICD-10-CM | POA: Insufficient documentation

## 2021-06-27 DIAGNOSIS — I10 Essential (primary) hypertension: Secondary | ICD-10-CM | POA: Diagnosis not present

## 2021-06-27 DIAGNOSIS — E663 Overweight: Secondary | ICD-10-CM | POA: Diagnosis not present

## 2021-06-27 HISTORY — PX: CATARACT EXTRACTION W/PHACO: SHX586

## 2021-06-27 SURGERY — PHACOEMULSIFICATION, CATARACT, WITH IOL INSERTION
Anesthesia: Monitor Anesthesia Care | Site: Eye | Laterality: Right

## 2021-06-27 MED ORDER — SIGHTPATH DOSE#1 BSS IO SOLN
INTRAOCULAR | Status: DC | PRN
  Administered 2021-06-27: 15 mL

## 2021-06-27 MED ORDER — ACETAMINOPHEN 160 MG/5ML PO SOLN: 325.0000 mg | ORAL | Status: DC | PRN

## 2021-06-27 MED ORDER — MOXIFLOXACIN HCL 0.5 % OP SOLN
OPHTHALMIC | Status: DC | PRN
  Administered 2021-06-27: 0.2 mL via OPHTHALMIC

## 2021-06-27 MED ORDER — ARMC OPHTHALMIC DILATING DROPS
1.0000 "application " | OPHTHALMIC | Status: DC | PRN
  Administered 2021-06-27 (×3): 1 via OPHTHALMIC

## 2021-06-27 MED ORDER — ONDANSETRON HCL 4 MG/2ML IJ SOLN: 4.0000 mg | Freq: Once | INTRAMUSCULAR | Status: DC | PRN

## 2021-06-27 MED ORDER — SIGHTPATH DOSE#1 BSS IO SOLN
INTRAOCULAR | Status: DC | PRN
  Administered 2021-06-27: 66 mL via OPHTHALMIC

## 2021-06-27 MED ORDER — MIDAZOLAM HCL 2 MG/2ML IJ SOLN
INTRAMUSCULAR | Status: DC | PRN
  Administered 2021-06-27: 1 mg via INTRAVENOUS

## 2021-06-27 MED ORDER — SIGHTPATH DOSE#1 SODIUM HYALURONATE 10 MG/ML IO SOLUTION
PREFILLED_SYRINGE | INTRAOCULAR | Status: DC | PRN
  Administered 2021-06-27: 0.85 mL via INTRAOCULAR

## 2021-06-27 MED ORDER — SIGHTPATH DOSE#1 SODIUM HYALURONATE 23 MG/ML IO SOLUTION
PREFILLED_SYRINGE | INTRAOCULAR | Status: DC | PRN
  Administered 2021-06-27: 0.6 mL via INTRAOCULAR

## 2021-06-27 MED ORDER — LIDOCAINE HCL (PF) 2 % IJ SOLN
INTRAMUSCULAR | Status: DC | PRN
  Administered 2021-06-27: 1 mL via INTRAOCULAR

## 2021-06-27 MED ORDER — TETRACAINE HCL 0.5 % OP SOLN
1.0000 [drp] | OPHTHALMIC | Status: DC | PRN
  Administered 2021-06-27 (×3): 1 [drp] via OPHTHALMIC

## 2021-06-27 MED ORDER — FENTANYL CITRATE (PF) 100 MCG/2ML IJ SOLN
INTRAMUSCULAR | Status: DC | PRN
  Administered 2021-06-27: 50 ug via INTRAVENOUS

## 2021-06-27 MED ORDER — ACETAMINOPHEN 325 MG PO TABS: 325.0000 mg | ORAL_TABLET | ORAL | Status: DC | PRN

## 2021-06-27 SURGICAL SUPPLY — 20 items
CANNULA ANT/CHMB 27G (MISCELLANEOUS) IMPLANT
CANNULA ANT/CHMB 27GA (MISCELLANEOUS) IMPLANT
CATARACT SUITE SIGHTPATH (MISCELLANEOUS) ×2 IMPLANT
DISSECTOR HYDRO NUCLEUS 50X22 (MISCELLANEOUS) ×2 IMPLANT
FEE CATARACT SUITE SIGHTPATH (MISCELLANEOUS) ×1 IMPLANT
GLOVE SURG GAMMEX PI TX LF 7.5 (GLOVE) ×2 IMPLANT
GLOVE SURG SYN 8.5  E (GLOVE) ×1
GLOVE SURG SYN 8.5 E (GLOVE) ×1 IMPLANT
GLOVE SURG SYN 8.5 PF PI (GLOVE) ×1 IMPLANT
LENS IOL TECNIS EYHANCE 19.0 (Intraocular Lens) ×1 IMPLANT
NDL FILTER BLUNT 18X1 1/2 (NEEDLE) ×1 IMPLANT
NEEDLE FILTER BLUNT 18X 1/2SAF (NEEDLE) ×1
NEEDLE FILTER BLUNT 18X1 1/2 (NEEDLE) ×1 IMPLANT
PACK VIT ANT 23G (MISCELLANEOUS) IMPLANT
RING MALYGIN (MISCELLANEOUS) IMPLANT
SUT ETHILON 10-0 CS-B-6CS-B-6 (SUTURE)
SUTURE EHLN 10-0 CS-B-6CS-B-6 (SUTURE) IMPLANT
SYR 3ML LL SCALE MARK (SYRINGE) ×2 IMPLANT
SYR 5ML LL (SYRINGE) ×2 IMPLANT
WATER STERILE IRR 250ML POUR (IV SOLUTION) ×2 IMPLANT

## 2021-06-27 NOTE — Transfer of Care (Signed)
Immediate Anesthesia Transfer of Care Note  Patient: Angela Cohen  Procedure(s) Performed: CATARACT EXTRACTION PHACO AND INTRAOCULAR LENS PLACEMENT (IOC) RIGHT (Right: Eye)  Patient Location: PACU  Anesthesia Type: MAC  Level of Consciousness: awake, alert  and patient cooperative  Airway and Oxygen Therapy: Patient Spontanous Breathing and Patient connected to supplemental oxygen  Post-op Assessment: Post-op Vital signs reviewed, Patient's Cardiovascular Status Stable, Respiratory Function Stable, Patent Airway and No signs of Nausea or vomiting  Post-op Vital Signs: Reviewed and stable  Complications: No notable events documented.

## 2021-06-27 NOTE — Anesthesia Postprocedure Evaluation (Signed)
Anesthesia Post Note  Patient: Angela Cohen  Procedure(s) Performed: CATARACT EXTRACTION PHACO AND INTRAOCULAR LENS PLACEMENT (IOC) RIGHT (Right: Eye)     Patient location during evaluation: PACU Anesthesia Type: MAC Level of consciousness: awake Pain management: pain level controlled Vital Signs Assessment: post-procedure vital signs reviewed and stable Respiratory status: respiratory function stable Cardiovascular status: stable Postop Assessment: no apparent nausea or vomiting Anesthetic complications: no   No notable events documented.  Veda Canning

## 2021-06-27 NOTE — H&P (Signed)
[facility]   Primary Care Physician:  Einar Pheasant, MD Ophthalmologist: Dr. Benay Pillow  Pre-Procedure History & Physical: HPI:  Angela Cohen is a 81 y.o. female here for cataract surgery.   Past Medical History:  Diagnosis Date   Asthma    Fibrocystic breast disease    Gastritis    EGD - schatzki's ring, gastritis and gastric polyps   History of colon polyps    Hypercholesterolemia    Hypertension    MAI (mycobacterium avium-intracellulare) infection ([place])    Osteoporosis    Reflux esophagitis     Past Surgical History:  Procedure Laterality Date   ABDOMINAL HYSTERECTOMY  1998   bladder tact     BREAST EXCISIONAL BIOPSY Bilateral 1970's   benign   BREAST EXCISIONAL BIOPSY Bilateral 2010   pt  stated dr Tamala Julian did bilat axilla lumps neg    BREAST SURGERY     cyst(benign)   CATARACT EXTRACTION W/PHACO Left 06/13/2021   Procedure: CATARACT EXTRACTION PHACO AND INTRAOCULAR LENS PLACEMENT ([place]) LEFT;  Surgeon: Eulogio Bear, MD;  Location: [place];  Service: Ophthalmology;  Laterality: Left;  7.04 0:40.5   COLONOSCOPY N/A 09/28/2014   Procedure: COLONOSCOPY;  Surgeon: Manya Silvas, MD;  Location: [facility] ENDOSCOPY;  Service: Endoscopy;  Laterality: N/A;   ESOPHAGOGASTRODUODENOSCOPY N/A 09/28/2014   Procedure: ESOPHAGOGASTRODUODENOSCOPY (EGD);  Surgeon: Manya Silvas, MD;  Location: [facility] ENDOSCOPY;  Service: Endoscopy;  Laterality: N/A;   ESOPHAGOGASTRODUODENOSCOPY (EGD) WITH PROPOFOL N/A 04/26/2016   Procedure: ESOPHAGOGASTRODUODENOSCOPY (EGD) WITH PROPOFOL;  Surgeon: Manya Silvas, MD;  Location: [facility] ENDOSCOPY;  Service: Endoscopy;  Laterality: N/A;   excision of right axillary lipoma  10/07   FOOT SURGERY Right    HEMORRHOID SURGERY     HERNIA REPAIR  10/07   NASAL SEPTUM SURGERY      Prior to Admission medications   Medication Sig Start Date End Date Taking? Authorizing Provider  albuterol (VENTOLIN HFA) 108 (90 Base) MCG/ACT  inhaler Inhale 2 puffs into the lungs every 6 (six) hours as needed for wheezing or shortness of breath. 01/18/21  Yes Tyler Pita, MD  aspirin 81 MG EC tablet Take 81 mg by mouth daily. Swallow whole.   Yes [provider]  fluticasone furoate-vilanterol (BREO ELLIPTA) 100-25 MCG/INH AEPB Inhale 1 puff into the lungs daily. 12/16/20  Yes Tyler Pita, MD  Fluticasone-Umeclidin-Vilant (TRELEGY ELLIPTA IN) Inhale into the lungs.   Yes [provider]  metoprolol succinate (TOPROL-XL) 25 MG 24 hr tablet Take 1 tablet (25 mg total) by mouth 2 (two) times daily. 02/28/21  Yes Einar Pheasant, MD  montelukast (SINGULAIR) 10 MG tablet TAKE 1 TABLET BY MOUTH EVERYDAY AT BEDTIME 05/18/21  Yes Einar Pheasant, MD  Multiple Vitamin (MULTIVITAMIN WITH MINERALS) TABS Take 1 tablet by mouth daily.   Yes [provider]  Omega-3 Fatty Acids (FISH OIL) 1000 MG CAPS Take 3,000 mg by mouth daily. 12/29/08  Yes [provider]  pantoprazole (PROTONIX) 20 MG tablet TAKE 1 TABLET BY MOUTH TWICE A DAY 01/19/21  Yes Einar Pheasant, MD  rosuvastatin (CRESTOR) 10 MG tablet Take 1 tablet (10 mg total) by mouth daily. 11/05/20  Yes Einar Pheasant, MD    Allergies as of 05/18/2021   (No Known Allergies)    Family History  Problem Relation Age of Onset   Heart disease Mother 5       heart attack   Cancer Father  lung   Diabetes Sister    Breast cancer Sister 5   Breast cancer Daughter 78   Stroke Brother    Diabetes Brother     Social History   Socioeconomic History   Marital status: Widowed    Spouse name: Not on file   Number of children: Not on file   Years of education: Not on file   Highest education level: Not on file  Occupational History   Not on file  Tobacco Use   Smoking status: Never   Smokeless tobacco: Never   Tobacco comments:    Never  Vaping Use   Vaping Use: Never used  Substance and Sexual Activity   Alcohol use: No     Alcohol/week: 0.0 standard drinks   Drug use: No   Sexual activity: Not on file  Other Topics Concern   Not on file  Social History Narrative   Not on file   Social Determinants of Health   Financial Resource Strain: Not on file  Food Insecurity: Not on file  Transportation Needs: Not on file  Physical Activity: Not on file  Stress: Not on file  Social Connections: Not on file  Intimate Partner Violence: Not on file    Review of Systems: See HPI, otherwise negative ROS  Physical Exam: BP (!) 173/86    Pulse 86    Temp 98.6 F (37 C) (Temporal)    Ht 5\' 4"  (1.626 m)    Wt 91.4 kg    SpO2 99%    BMI 34.59 kg/m  General:   Alert, cooperative in NAD Head:  Normocephalic and atraumatic. Respiratory:  Normal work of breathing. Cardiovascular:  RRR  Impression/Plan: Angela Cohen is here for cataract surgery.  Risks, benefits, limitations, and alternatives regarding cataract surgery have been reviewed with the patient.  Questions have been answered.  All parties agreeable.   Benay Pillow, MD  06/27/2021, 10:21 AM

## 2021-06-27 NOTE — Op Note (Signed)
OPERATIVE NOTE  Angela Cohen [id number] 06/27/2021   PREOPERATIVE DIAGNOSIS:  Nuclear sclerotic cataract right eye.  H25.11   POSTOPERATIVE DIAGNOSIS:    Nuclear sclerotic cataract right eye.     PROCEDURE:  Phacoemusification with posterior chamber intraocular lens placement of the right eye   LENS:   Implant Name Type Inv. Item Serial No. Manufacturer Lot No. LRB No. Used Action  LENS IOL TECNIS EYHANCE 19.0 - [id number] Intraocular Lens LENS IOL TECNIS EYHANCE 19.0 [id number] [employer]  Right 1 Implanted       Procedure(s) with comments: CATARACT EXTRACTION PHACO AND INTRAOCULAR LENS PLACEMENT (IOC) RIGHT (Right) - 3.32 0:30.7  DIB00 +19.0   ULTRASOUND TIME: 0 minutes 30 seconds.  CDE 3.32   SURGEON:  Benay Pillow, MD, MPH  ANESTHESIOLOGIST: Anesthesiologist: Veda Canning, MD CRNA: Dionne Bucy, CRNA   ANESTHESIA:  Topical with tetracaine drops augmented with 1% preservative-free intracameral lidocaine.  ESTIMATED BLOOD LOSS: less than 1 mL.   COMPLICATIONS:  None.   DESCRIPTION OF PROCEDURE:  The patient was identified in the holding room and transported to the operating room and placed in the supine position under the operating microscope.  The right eye was identified as the operative eye and it was prepped and draped in the usual sterile ophthalmic fashion.   A 1.0 millimeter clear-corneal paracentesis was made at the 10:30 position. 0.5 ml of preservative-free 1% lidocaine with epinephrine was injected into the anterior chamber.  The anterior chamber was filled with Healon 5 viscoelastic.  A 2.4 millimeter keratome was used to make a near-clear corneal incision at the 8:00 position.  A curvilinear capsulorrhexis was made with a cystotome and capsulorrhexis forceps.  Balanced salt solution was used to hydrodissect and hydrodelineate the nucleus.   Phacoemulsification was then used in stop and chop fashion to remove the lens nucleus and epinucleus.  The remaining  cortex was then removed using the irrigation and aspiration handpiece. Healon was then placed into the capsular bag to distend it for lens placement.  A lens was then injected into the capsular bag.  The remaining viscoelastic was aspirated.   Wounds were hydrated with balanced salt solution.  The anterior chamber was inflated to a physiologic pressure with balanced salt solution.   Intracameral vigamox 0.1 mL undiluted was injected into the eye and a drop placed onto the ocular surface.  No wound leaks were noted.  The patient was taken to the recovery room in stable condition without complications of anesthesia or surgery  Benay Pillow 06/27/2021, 10:50 AM

## 2021-06-27 NOTE — Anesthesia Preprocedure Evaluation (Signed)
Anesthesia Evaluation  Patient identified by MRN, date of birth, ID band Patient awake    Reviewed: Allergy & Precautions, H&P , NPO status , Patient's Chart, lab work & pertinent test results  Airway Mallampati: II  TM Distance: >3 FB Neck ROM: full    Dental no notable dental hx.    Pulmonary asthma ,    Pulmonary exam normal breath sounds clear to auscultation       Cardiovascular hypertension, Normal cardiovascular exam Rhythm:regular Rate:Normal     Neuro/Psych  Headaches,    GI/Hepatic GERD  ,  Endo/Other  overweight  Renal/GU      Musculoskeletal   Abdominal   Peds  Hematology   Anesthesia Other Findings   Reproductive/Obstetrics                             Anesthesia Physical  Anesthesia Plan  ASA: 2  Anesthesia Plan: MAC   Post-op Pain Management: Minimal or no pain anticipated   Induction:   PONV Risk Score and Plan: 2 and Treatment may vary due to age or medical condition, TIVA and Midazolam  Airway Management Planned: Nasal Cannula  Additional Equipment:   Intra-op Plan:   Post-operative Plan:   Informed Consent: I have reviewed the patients History and Physical, chart, labs and discussed the procedure including the risks, benefits and alternatives for the proposed anesthesia with the patient or authorized representative who has indicated his/her understanding and acceptance.     Dental Advisory Given  Plan Discussed with: CRNA  Anesthesia Plan Comments:         Anesthesia Quick Evaluation

## 2021-06-28 ENCOUNTER — Encounter: Payer: Self-pay | Admitting: Ophthalmology

## 2021-08-16 ENCOUNTER — Other Ambulatory Visit: Payer: Self-pay

## 2021-08-16 ENCOUNTER — Emergency Department: Payer: Medicare Other

## 2021-08-16 ENCOUNTER — Emergency Department
Admission: EM | Admit: 2021-08-16 | Discharge: 2021-08-16 | Disposition: A | Discharge status: Home / Self Care | Payer: Medicare Other | Attending: Emergency Medicine | Admitting: Emergency Medicine

## 2021-08-16 DIAGNOSIS — S060X0A Concussion without loss of consciousness, initial encounter: Secondary | ICD-10-CM | POA: Diagnosis not present

## 2021-08-16 DIAGNOSIS — Y92009 Unspecified place in unspecified non-institutional (private) residence as the place of occurrence of the external cause: Secondary | ICD-10-CM | POA: Insufficient documentation

## 2021-08-16 DIAGNOSIS — I10 Essential (primary) hypertension: Secondary | ICD-10-CM | POA: Diagnosis not present

## 2021-08-16 DIAGNOSIS — W01198A Fall on same level from slipping, tripping and stumbling with subsequent striking against other object, initial encounter: Secondary | ICD-10-CM | POA: Insufficient documentation

## 2021-08-16 DIAGNOSIS — W19XXXA Unspecified fall, initial encounter: Secondary | ICD-10-CM

## 2021-08-16 DIAGNOSIS — S0990XA Unspecified injury of head, initial encounter: Secondary | ICD-10-CM | POA: Diagnosis present

## 2021-08-16 MED ORDER — ONDANSETRON 4 MG PO TBDP: 4.0000 mg | ORAL_TABLET | ORAL | 0 refills | Status: DC | PRN

## 2021-08-16 NOTE — ED Triage Notes (Addendum)
Pt comes with c/o fall. Pt states she fell Sunday morning. Pt states she just tripped over her feet. Pt hit head on dresser. No loc.  Pt is on aspirin. Pt states pain to back of head and nausea.  ?

## 2021-08-16 NOTE — ED Provider Notes (Signed)
? ?[facility] ?Provider Note ? ? ? Event Date/Time  ? First MD Initiated Contact with Patient 08/16/21 1145   ?  (approximate) ? ? ?History  ? ?Fall ? ? ?HPI ? ?Angela Cohen is a 81 y.o. female with a past history of hypertension, GERD, dysphagia who comes ED complaining of a mechanical fall, tripping and falling and hitting her head on a dresser at home 2 days ago.  No loss of consciousness.  Complains of some pain in the back of the head that she has been taking ibuprofen for.  Pain is not worsening.  She has nausea which is controlled with Zofran that she has at home.  No vomiting.  No vision changes neck pain paresthesias or motor weakness.  She is continued to be ambulatory. ? ?She is having several symptoms since hitting her head including decreased appetite, poor sleep, feeling somewhat off balance which she is controlling by using a walker that is available at home. ?  ? ? ?Physical Exam  ? ?Triage Vital Signs: ?ED Triage Vitals  ?Enc Vitals Group  ?   BP 08/16/21 1043 (!) 143/66  ?   Pulse Rate 08/16/21 1043 89  ?   Resp 08/16/21 1043 18  ?   Temp 08/16/21 1043 97.7 ?F (36.5 ?C)  ?   Temp Source 08/16/21 1043 Oral  ?   SpO2 08/16/21 1043 98 %  ?   Weight 08/16/21 1109 201 lb 8 oz (91.4 kg)  ?   Height 08/16/21 1109 '5\' 4"'$  (1.626 m)  ?   Head Circumference --   ?   Peak Flow --   ?   Pain Score 08/16/21 1042 10  ?   Pain Loc --   ?   Pain Edu? --   ?   Excl. in [place]? --   ? ? ?Most recent vital signs: ?Vitals:  ? 08/16/21 1043  ?BP: (!) 143/66  ?Pulse: 89  ?Resp: 18  ?Temp: 97.7 ?F (36.5 ?C)  ?SpO2: 98%  ? ? ? ?General: Awake, no distress.  ?CV:  Good peripheral perfusion.  ?Resp:  Normal effort.  ?Abd:  No distention.  ?Other:  No midline C-spine tenderness.  No laceration or scalp hematoma.  Full range of motion of the neck.  No palpable skull depression or point tenderness ? ? ?ED Results / Procedures / Treatments  ? ?Labs ?(all labs ordered are listed, but only abnormal results are  displayed) ?Labs Reviewed - No data to display ? ? ?EKG ? ? ? ? ?RADIOLOGY ?CT head viewed and interpreted by me, negative for intracranial hemorrhage.  Radiology report reviewed. ? ?CT cervical spine negative. ? ? ? ?PROCEDURES: ? ?Critical Care performed: No ? ?Procedures ? ? ?MEDICATIONS ORDERED IN ED: ?Medications - No data to display ? ? ?IMPRESSION / MDM / ASSESSMENT AND PLAN / ED COURSE  ?I reviewed the triage vital signs and the nursing notes. ?             ?               ? ?Differential diagnosis includes, but is not limited to, intracranial hemorrhage, C-spine fracture, concussion, skull fracture ? ? ? ?Patient presents with posterior headache after mechanical fall.  Imaging is negative for fracture or intracranial hemorrhage.  She has multiple symptoms which are attributable to concussion.  Husband is at bedside, supportive and able to care for her.  Patient is in good spirits, feels comfortable with  outpatient management.  She will take Tylenol for headache, continue Zofran, follow-up with primary care.  Not on blood thinners. ?  ? ? ?FINAL CLINICAL IMPRESSION(S) / ED DIAGNOSES  ? ?Final diagnoses:  ?Fall in home, initial encounter  ?Concussion without loss of consciousness, initial encounter  ? ? ? ?Rx / DC Orders  ? ?ED Discharge Orders   ? ?      Ordered  ?  ondansetron (ZOFRAN-ODT) 4 MG disintegrating tablet  Every 4 hours PRN       ? 08/16/21 1207  ? ?  ?  ? ?  ? ? ? ?Note:  This document was prepared using Dragon voice recognition software and may include unintentional dictation errors. ?  ?Carrie Mew, MD ?08/16/21 1210 ? ?

## 2021-08-16 NOTE — ED Notes (Signed)
Pt d/c home with husband per MD order. Discharge summary reviewed, verbalize understanding. Off unit via WC- no s/s of acute distress noted at discharge.  ?

## 2021-08-22 DIAGNOSIS — M79672 Pain in left foot: Secondary | ICD-10-CM | POA: Insufficient documentation

## 2021-08-22 DIAGNOSIS — M5416 Radiculopathy, lumbar region: Secondary | ICD-10-CM | POA: Insufficient documentation

## 2021-09-05 DIAGNOSIS — M5410 Radiculopathy, site unspecified: Secondary | ICD-10-CM | POA: Insufficient documentation

## 2021-10-04 ENCOUNTER — Telehealth: Payer: Self-pay | Admitting: Internal Medicine

## 2021-10-04 NOTE — Telephone Encounter (Signed)
S/w pt - stated she saw a spine specialist and was told that Prolia is not a shot to help with her bones. Pt assured Prolia is for bone support.

## 2021-10-04 NOTE — Telephone Encounter (Signed)
Pt called stating she wants to know what kind of prolia shot she is taking

## 2021-10-14 ENCOUNTER — Telehealth: Payer: Self-pay | Admitting: *Deleted

## 2021-10-14 NOTE — Telephone Encounter (Signed)
$  0 Due, No PA required. Appt already scheduled on 12/05/21.

## 2021-10-28 ENCOUNTER — Other Ambulatory Visit: Payer: Self-pay | Admitting: Internal Medicine

## 2021-10-28 DIAGNOSIS — Z1231 Encounter for screening mammogram for malignant neoplasm of breast: Secondary | ICD-10-CM

## 2021-11-08 ENCOUNTER — Ambulatory Visit: Payer: Medicare Other | Admitting: Internal Medicine

## 2021-11-09 ENCOUNTER — Telehealth: Payer: Self-pay | Admitting: Internal Medicine

## 2021-11-09 NOTE — Telephone Encounter (Signed)
Just FYI I scheduled patient for the 6th. Pt is calling Daughter Lattie Haw to see if she would bring her that day. She did not want to go to ED or UC due to the business & potential of getting sick while there. She understands if symptoms change & are acute then she needs to be seen sooner at ED for evaluation.

## 2021-11-09 NOTE — Telephone Encounter (Signed)
Pt called stating she fell and hit her head in May and she went to the ED and they performed a ct scan on her and told her she had a concussion. Pt stated she would like to get another ct scan done because pt is having headaches again and she can only do the scan on Mondays and Tuesdays sent to access nurse

## 2021-11-09 NOTE — Telephone Encounter (Signed)
LMTCB

## 2021-11-09 NOTE — Telephone Encounter (Signed)
Patient states she is returning a call from our nurse.

## 2021-11-09 NOTE — Telephone Encounter (Signed)
Agree with need for reevaluation if having persistent headache.  I can work her in 11/17/21 at 2:30, but given persistent headache and possible need for scan, I feel needs to be evaluated prior to this and we can then f/u after.  If no openings here, would recommend acute care/urgent care - to confirm nothing acute going on and then can f/u here.

## 2021-11-09 NOTE — Telephone Encounter (Signed)
I called to triage patient and she stated that access nurse wanted her seen in the next few days. Pt stated that since she fell in May that she has had a constant dull HA. She  was told by provider in ED that if her HA persisted that she may need to have CT repeated. She said that these are not new symptoms for her she has jst been hoping they would go away. She also is having a shadow like cast over her right after cataract surgery & fall. I advised here to reach out to her eye surgeon regarding this as well. Pt has no other vision issues nor has had any episodes of loss of consciousness. She did say that she is dizzy at times when she bends over & stands back up. She also is not steady on her feat & stated that she has had foot injections done. I advised her that you had no openings & most likely would want to se her before order CT scan. I did put her in first available opening 11/29/21. I asked that if she had changing symptoms or worsening symptoms that she needed to proceed to ED for imaging that could be done on the spot. Pt verbalized understanding.

## 2021-11-09 NOTE — Telephone Encounter (Signed)
Patient states that she will be unable to make her appointment on 11/17/2021 because she doesn't have anyone to bring her.  Patient states she would like to cancel her appointment for 7/6/20203 and keep her appointment for 11/29/2021.

## 2021-11-17 ENCOUNTER — Ambulatory Visit: Payer: Medicare Other | Admitting: Internal Medicine

## 2021-11-17 ENCOUNTER — Other Ambulatory Visit: Payer: Self-pay | Admitting: Internal Medicine

## 2021-11-29 ENCOUNTER — Encounter: Payer: Self-pay | Admitting: Internal Medicine

## 2021-11-29 ENCOUNTER — Ambulatory Visit (INDEPENDENT_AMBULATORY_CARE_PROVIDER_SITE_OTHER): Payer: Medicare Other | Admitting: Internal Medicine

## 2021-11-29 VITALS — BP 134/74 | HR 82 | Temp 98.2°F | Resp 17 | Ht 64.0 in | Wt 200.0 lb

## 2021-11-29 DIAGNOSIS — Z860100 Personal history of colon polyps, unspecified: Secondary | ICD-10-CM

## 2021-11-29 DIAGNOSIS — M810 Age-related osteoporosis without current pathological fracture: Secondary | ICD-10-CM

## 2021-11-29 DIAGNOSIS — D329 Benign neoplasm of meninges, unspecified: Secondary | ICD-10-CM

## 2021-11-29 DIAGNOSIS — E1165 Type 2 diabetes mellitus with hyperglycemia: Secondary | ICD-10-CM | POA: Diagnosis not present

## 2021-11-29 DIAGNOSIS — K219 Gastro-esophageal reflux disease without esophagitis: Secondary | ICD-10-CM | POA: Diagnosis not present

## 2021-11-29 DIAGNOSIS — E7800 Pure hypercholesterolemia, unspecified: Secondary | ICD-10-CM | POA: Diagnosis not present

## 2021-11-29 DIAGNOSIS — I10 Essential (primary) hypertension: Secondary | ICD-10-CM

## 2021-11-29 DIAGNOSIS — R42 Dizziness and giddiness: Secondary | ICD-10-CM

## 2021-11-29 DIAGNOSIS — R519 Headache, unspecified: Secondary | ICD-10-CM

## 2021-11-29 DIAGNOSIS — R1310 Dysphagia, unspecified: Secondary | ICD-10-CM

## 2021-11-29 LAB — SEDIMENTATION RATE: Sed Rate: 24 mm/hr (ref 0–30)

## 2021-11-29 LAB — BASIC METABOLIC PANEL
BUN: 19 mg/dL (ref 6–23)
CO2: 30 mEq/L (ref 19–32)
Calcium: 9 mg/dL (ref 8.4–10.5)
Chloride: 104 mEq/L (ref 96–112)
Creatinine, Ser: 0.87 mg/dL (ref 0.40–1.20)
GFR: 63.14 mL/min (ref >60.00)
Glucose, Bld: 89 mg/dL (ref 70–99)
Potassium: 4.9 mEq/L (ref 3.5–5.1)
Sodium: 140 mEq/L (ref 135–145)

## 2021-11-29 LAB — HEPATIC FUNCTION PANEL
ALT: 23 U/L (ref 0–35)
AST: 21 U/L (ref 0–37)
Albumin: 4.1 g/dL (ref 3.5–5.2)
Alkaline Phosphatase: 62 U/L (ref 39–117)
Bilirubin, Direct: 0 mg/dL (ref 0.0–0.3)
Total Bilirubin: 0.2 mg/dL (ref 0.2–1.2)
Total Protein: 6.5 g/dL (ref 6.0–8.3)

## 2021-11-29 LAB — CBC WITH DIFFERENTIAL/PLATELET
Basophils Absolute: 0.1 10*3/uL (ref 0.0–0.1)
Basophils Relative: 1.4 % (ref 0.0–3.0)
Eosinophils Absolute: 0.4 10*3/uL (ref 0.0–0.7)
Eosinophils Relative: 8.5 % — ABNORMAL HIGH (ref 0.0–5.0)
HCT: 29.5 % — ABNORMAL LOW (ref 36.0–46.0)
Hemoglobin: 9.2 g/dL — ABNORMAL LOW (ref 12.0–15.0)
Lymphocytes Relative: 23.5 % (ref 12.0–46.0)
Lymphs Abs: 1 10*3/uL (ref 0.7–4.0)
MCHC: 31.4 g/dL (ref 30.0–36.0)
MCV: 96.9 fl (ref 78.0–100.0)
Monocytes Absolute: 0.5 10*3/uL (ref 0.1–1.0)
Monocytes Relative: 10.8 % (ref 3.0–12.0)
Neutro Abs: 2.4 10*3/uL (ref 1.4–7.7)
Neutrophils Relative %: 55.8 % (ref 43.0–77.0)
Platelets: 291 10*3/uL (ref 150.0–400.0)
RBC: 3.04 Mil/uL — ABNORMAL LOW (ref 3.87–5.11)
RDW: 15.5 % (ref 11.5–15.5)
WBC: 4.3 10*3/uL (ref 4.0–10.5)

## 2021-11-29 LAB — HEMOGLOBIN A1C: Hgb A1c MFr Bld: 5.8 % (ref 4.6–6.5)

## 2021-11-29 NOTE — Progress Notes (Signed)
[name], female   DOB: 1940/10/19, 81 y.o.   MRN: [id number]   Subjective:    [name], female    DOB: [birth year], 81 y.o.   MRN: [id number]   Patient here for work in appt .   HPI Work in with persistent headache and dizziness s/p fall.  She is accompanied by her friend.  History obtained from both of them.  Larey Seat 08/16/21 - feet tangled up and fell - head hit dresser.  Evaluated in ER 08/16/32.  Per ER report, no LOC. Pt unsure.  Unwitnessed.  Had CT head and c-spine - negative for fracture or intracranial hemorrhage.  Instructed to take tylenol.  She comes in today with reports of persistent headache and dizziness.  Notices increased pain - posterior head - lying down.  Dizziness worse when lying down and also with position changes.  Feels unsteady.  Some nausea.  No vomiting.  Eating.  No chest pain or sob.  With the fall, aggravated her back.  No increased pain currently in her back.  Headache is constant.  Just had eyes checked yesterday.  Discussed above.  States eyes checked out fine.     Past Medical History:  Diagnosis Date   Asthma    Fibrocystic breast disease    Gastritis    EGD - schatzki's ring, gastritis and gastric polyps   History of colon polyps    Hypercholesterolemia    Hypertension    MAI (mycobacterium avium-intracellulare) infection (HCC)    Osteoporosis    Reflux esophagitis    Past Surgical History:  Procedure Laterality Date   ABDOMINAL HYSTERECTOMY  1998   bladder tact     BREAST EXCISIONAL BIOPSY Bilateral 1970's   benign   BREAST EXCISIONAL BIOPSY Bilateral 2010   pt  stated dr Katrinka Blazing did bilat axilla lumps neg    BREAST SURGERY     cyst(benign)   CATARACT EXTRACTION W/PHACO Left 06/13/2021   Procedure: CATARACT EXTRACTION PHACO AND INTRAOCULAR LENS PLACEMENT (IOC) LEFT;  Surgeon: Nevada Crane, MD;  Location: [facility];  Service: Ophthalmology;  Laterality: Left;  7.04 0:40.5   CATARACT EXTRACTION W/PHACO  Right 06/27/2021   Procedure: CATARACT EXTRACTION PHACO AND INTRAOCULAR LENS PLACEMENT (IOC) RIGHT;  Surgeon: Nevada Crane, MD;  Location: [facility];  Service: Ophthalmology;  Laterality: Right;  3.32 0:30.7   COLONOSCOPY N/A 09/28/2014   Procedure: COLONOSCOPY;  Surgeon: Scot Jun, MD;  Location: [facility];  Service: Endoscopy;  Laterality: N/A;   ESOPHAGOGASTRODUODENOSCOPY N/A 09/28/2014   Procedure: ESOPHAGOGASTRODUODENOSCOPY (EGD);  Surgeon: Scot Jun, MD;  Location: [facility] END[facility]COPY;  Service: Endoscopy;  Laterality: N/A;   ESOPHAGOGASTRODUODENOSCOPY (EGD) WITH PROPOFOL N/A 04/26/2016   Procedure: ESOPHAGOGASTRODUODENOSCOPY (EGD) WITH PROPOFOL;  Surgeon: Scot Jun, MD;  Location: [facility] ENDOSCOPY;  Service: Endoscopy;  Laterality: N/A;   excision of right axillary lipoma  10/07   FOOT SURGERY Right    HEMORRHOID SURGERY     HERNIA REPAIR  10/07   NASAL SEPTUM SURGERY     Family History  Problem Relation Age of Onset   Heart disease Mother 16       heart attack   Cancer Father        lung   Diabetes Sister    Breast cancer Sister 46   Breast cancer Daughter 36   Stroke Brother    Diabetes Brother    Social History   Socioeconomic History   Marital status:  Widowed    Spouse name: Not on file   Number of children: Not on file   Years of education: Not on file   Highest education level: Not on file  Occupational History   Not on file  Tobacco Use   Smoking status: Never   Smokeless tobacco: Never   Tobacco comments:    Never  Vaping Use   Vaping Use: Never used  Substance and Sexual Activity   Alcohol use: No    Alcohol/week: 0.0 standard drinks of alcohol   Drug use: No   Sexual activity: Not on file  Other Topics Concern   Not on file  Social History Narrative   Not on file   Social Determinants of Health   Financial Resource Strain: Not on file  Food Insecurity: Not on file  Transportation Needs: Not on file  Physical  Activity: Not on file  Stress: Not on file  Social Connections: Not on file     Review of Systems  Constitutional:  Negative for appetite change and fever.  HENT:  Negative for congestion and sinus pressure.   Respiratory:  Negative for cough, chest tightness and shortness of breath.   Cardiovascular:  Negative for chest pain, palpitations and leg swelling.  Gastrointestinal:  Negative for abdominal pain, diarrhea and vomiting.  Genitourinary:  Negative for difficulty urinating and dysuria.  Musculoskeletal:  Negative for joint swelling and myalgias.  Skin:  Negative for color change and rash.  Neurological:  Positive for dizziness, light-headedness and headaches.  Psychiatric/Behavioral:  Negative for agitation and dysphoric mood.        Objective:     BP 134/74 (BP Location: Left Arm, Patient Position: Sitting, Cuff Size: Small)   Pulse 82   Temp 98.2 F (36.8 C) (Temporal)   Resp 17   Ht 5\' 4"  (1.626 m)   Wt 200 lb (90.7 kg)   SpO2 97%   BMI 34.33 kg/m  Wt Readings from Last 3 Encounters:  11/29/21 200 lb (90.7 kg)  08/16/21 201 lb 8 oz (91.4 kg)  06/27/21 201 lb 8 oz (91.4 kg)    Physical Exam Vitals reviewed.  Constitutional:      General: She is not in acute distress.    Appearance: Normal appearance.  HENT:     Head: Normocephalic and atraumatic.     Right Ear: External ear normal.     Left Ear: External ear normal.  Eyes:     General: No scleral icterus.       Right eye: No discharge.        Left eye: No discharge.     Conjunctiva/sclera: Conjunctivae normal.  Neck:     Thyroid: No thyromegaly.  Cardiovascular:     Rate and Rhythm: Normal rate and regular rhythm.  Pulmonary:     Effort: No respiratory distress.     Breath sounds: Normal breath sounds. No wheezing.  Abdominal:     General: Bowel sounds are normal.     Palpations: Abdomen is soft.     Tenderness: There is no abdominal tenderness.  Musculoskeletal:        General: No swelling or  tenderness.     Cervical back: Neck supple. No tenderness.  Lymphadenopathy:     Cervical: No cervical adenopathy.  Skin:    Findings: No erythema or rash.  Neurological:     Mental Status: She is alert.     Comments: Unsteady gait (using a walker at home).  FTN - intact.  Psychiatric:        Mood and Affect: Mood normal.        Behavior: Behavior normal.      Outpatient Encounter Medications as of 11/29/2021  Medication Sig   albuterol (VENTOLIN HFA) 108 (90 Base) MCG/ACT inhaler Inhale 2 puffs into the lungs every 6 (six) hours as needed for wheezing or shortness of breath.   aspirin 81 MG EC tablet Take 81 mg by mouth daily. Swallow whole.   fluticasone furoate-vilanterol (BREO ELLIPTA) 100-25 MCG/INH AEPB Inhale 1 puff into the lungs daily.   Fluticasone-Umeclidin-Vilant (TRELEGY ELLIPTA IN) Inhale into the lungs.   metoprolol succinate (TOPROL-XL) 25 MG 24 hr tablet Take 1 tablet (25 mg total) by mouth 2 (two) times daily.   montelukast (SINGULAIR) 10 MG tablet TAKE 1 TABLET BY MOUTH EVERYDAY AT BEDTIME   Multiple Vitamin (MULTIVITAMIN WITH MINERALS) TABS Take 1 tablet by mouth daily.   Omega-3 Fatty Acids (FISH OIL) 1000 MG CAPS Take 3,000 mg by mouth daily.   ondansetron (ZOFRAN-ODT) 4 MG disintegrating tablet Take 1 tablet (4 mg total) by mouth every 4 (four) hours as needed for nausea or vomiting.   pantoprazole (PROTONIX) 20 MG tablet TAKE 1 TABLET BY MOUTH TWICE A DAY   rosuvastatin (CRESTOR) 10 MG tablet Take 1 tablet (10 mg total) by mouth daily.   No facility-administered encounter medications on file as of 11/29/2021.     Lab Results  Component Value Date   WBC 4.3 11/29/2021   HGB 9.2 (L) 11/29/2021   HCT 29.5 (L) 11/29/2021   PLT 291.0 11/29/2021   GLUCOSE 89 11/29/2021   CHOL 201 (H) 05/10/2021   TRIG 169.0 (H) 05/10/2021   HDL 78.10 05/10/2021   LDLCALC 89 05/10/2021   ALT 23 11/29/2021   AST 21 11/29/2021   NA 140 11/29/2021   K 4.9 11/29/2021   CL  104 11/29/2021   CREATININE 0.87 11/29/2021   BUN 19 11/29/2021   CO2 30 11/29/2021   TSH 2.87 05/10/2021   HGBA1C 5.8 11/29/2021   MICROALBUR 1.0 05/10/2021    CT CERVICAL SPINE WO CONTRAST  Result Date: 08/16/2021 CLINICAL DATA:  Trauma, fall EXAM: CT CERVICAL SPINE WITHOUT CONTRAST TECHNIQUE: Multidetector CT imaging of the cervical spine was performed without intravenous contrast. Multiplanar CT image reconstructions were also generated. RADIATION DOSE REDUCTION: This exam was performed according to the departmental dose-optimization program which includes automated exposure control, adjustment of the mA and/or kV according to patient size and/or use of iterative reconstruction technique. COMPARISON:  None. FINDINGS: Alignment: There is minimal anterolisthesis at C4-C5 level which may be due to previous ligament injury and facet degeneration. Skull base and vertebrae: No recent fracture is seen. Degenerative changes are noted with bony spurs and facet degeneration from C3-C7 levels. Soft tissues and spinal canal: There is no central spinal stenosis. Disc levels: There is encroachment of neural foramina from C3 to C7 levels. Upper chest: Pleural thickening is seen in both apices. Other: None IMPRESSION: No recent fracture is seen in the cervical spine. There is minimal 2-3 mm anterolisthesis at C4-C5 level which may be residual from previous ligament injury and facet degeneration. Cervical spondylosis with encroachment of neural foramina by bony spurs from C3-C7 levels. Electronically Signed   By: Ernie Avena M.D.   On: 08/16/2021 11:17   CT HEAD WO CONTRAST ( )  Result Date: 08/16/2021 CLINICAL DATA:  Trauma, fall EXAM: CT HEAD WITHOUT CONTRAST TECHNIQUE: Contiguous axial images were obtained from the base  of the skull through the vertex without intravenous contrast. RADIATION DOSE REDUCTION: This exam was performed according to the departmental dose-optimization program which includes  automated exposure control, adjustment of the mA and/or kV according to patient size and/or use of iterative reconstruction technique. COMPARISON:  06/04/2013 FINDINGS: Brain: No acute intracranial findings are seen in noncontrast CT brain. Ventricles are not dilated. There is decreased density in the periventricular and subcortical white matter. Vascular: Scattered arterial calcifications are seen. Skull: Unremarkable. Sinuses/Orbits: There is mild mucosal thickening in the ethmoid sinus. Other: None IMPRESSION: No acute intracranial findings are seen in noncontrast CT brain. Small-vessel disease. Electronically Signed   By: Ernie Avena M.D.   On: 08/16/2021 11:13       Assessment & Plan:   Problem List Items Addressed This Visit     Dizziness    Has noticed persistent headache and dizziness and unsteady gait - since fall/head injury.  Previous CT unrevealing.  Given persistent symptoms as outlined, will check MRI brain.  Further w/up pending results.  Continue slow position changes and movements.  Continue to use walker.  Discussed decreased stimulation given persistent symptoms (concussion?).        Relevant Orders   MR Brain W Wo Contrast   Dysphagia   Essential hypertension, benign - Primary    Blood pressure as outlined.  On metoprolol.  Follow pressures. Follow metabolic panel.       Relevant Orders   Basic metabolic panel (Completed)   GERD (gastroesophageal reflux disease)    No acid reflux reported.  Continue protonix.       Headache    Has noticed persistent headache and dizziness and unsteady gait - since fall/head injury.  Previous CT unrevealing.  Given persistent symptoms as outlined, will check MRI brain.  Further w/up pending results.  Continue slow position changes and movements.  Continue to use walker.  Discussed decreased stimulation given persistent symptoms (concussion?).        Relevant Orders   Sedimentation rate (Completed)   MR Brain W Wo Contrast    History of colonic polyps    Per review, colonoscopy May 2016.  Recommended follow-up in 5 years.  Scheduled for colonoscopy in 12/2021.       Hypercholesterolemia    Continue Crestor.  Low-cholesterol diet and exercise.  Follow-up fast lipid profile liver panel.      Relevant Orders   CBC with Differential/Platelet (Completed)   Hepatic function panel (Completed)   Meningioma (HCC)    Last evaluation with neurology, they reviewed MRI and felt stable.  No follow-up recommended.      Relevant Orders   MR Brain W Wo Contrast   Osteoporosis    Continue prolia.       Type 2 diabetes mellitus with hyperglycemia (HCC)    Low carb diet and exercise.  Follow met b and a1c.  Just had eyes checked.       Relevant Orders   Hemoglobin A1c (Completed)    I spent 45 minutes with the patient. Time spent discussing her current concerns and symptoms.  Specifically time spent discussing her headache, dizziness, unsteady gait and recent fall.  Time also spent discussing further w/up, evaluation and treatment.   Dale Sterling, MD

## 2021-11-30 ENCOUNTER — Encounter: Payer: Self-pay | Admitting: Internal Medicine

## 2021-11-30 ENCOUNTER — Ambulatory Visit
Admission: RE | Admit: 2021-11-30 | Discharge: 2021-11-30 | Disposition: A | Discharge status: Home / Self Care | Payer: Medicare Other | Source: Ambulatory Visit | Attending: Internal Medicine | Admitting: Internal Medicine

## 2021-11-30 ENCOUNTER — Other Ambulatory Visit: Payer: Self-pay | Admitting: Internal Medicine

## 2021-11-30 DIAGNOSIS — R519 Headache, unspecified: Secondary | ICD-10-CM | POA: Insufficient documentation

## 2021-11-30 DIAGNOSIS — D329 Benign neoplasm of meninges, unspecified: Secondary | ICD-10-CM | POA: Insufficient documentation

## 2021-11-30 DIAGNOSIS — R42 Dizziness and giddiness: Secondary | ICD-10-CM | POA: Diagnosis present

## 2021-11-30 DIAGNOSIS — D649 Anemia, unspecified: Secondary | ICD-10-CM

## 2021-11-30 MED ORDER — GADOBUTROL 1 MMOL/ML IV SOLN
9.0000 mL | Freq: Once | INTRAVENOUS | Status: AC | PRN
  Administered 2021-11-30: 9 mL via INTRAVENOUS

## 2021-11-30 NOTE — Assessment & Plan Note (Signed)
No acid reflux reported.  Continue protonix.  

## 2021-11-30 NOTE — Assessment & Plan Note (Signed)
Has noticed persistent headache and dizziness and unsteady gait - since fall/head injury.  Previous CT unrevealing.  Given persistent symptoms as outlined, will check MRI brain.  Further w/up pending results.  Continue slow position changes and movements.  Continue to use walker.  Discussed decreased stimulation given persistent symptoms (concussion?).

## 2021-11-30 NOTE — Assessment & Plan Note (Signed)
Continue Crestor.  Low-cholesterol diet and exercise.  Follow-up fast lipid profile liver panel. 

## 2021-11-30 NOTE — Assessment & Plan Note (Signed)
Blood pressure as outlined.  On metoprolol.  Follow pressures. Follow metabolic panel.  

## 2021-11-30 NOTE — Assessment & Plan Note (Signed)
Low carb diet and exercise.  Follow met b and a1c.  Just had eyes checked.

## 2021-11-30 NOTE — Assessment & Plan Note (Signed)
Per review, colonoscopy May 2016.  Recommended follow-up in 5 years.  Scheduled for colonoscopy in 12/2021.

## 2021-11-30 NOTE — Assessment & Plan Note (Signed)
Last evaluation with neurology, they reviewed MRI and felt stable.  No follow-up recommended.

## 2021-11-30 NOTE — Assessment & Plan Note (Signed)
Continue prolia.  

## 2021-11-30 NOTE — Progress Notes (Signed)
Orders placed for f/u labs.  

## 2021-12-01 ENCOUNTER — Other Ambulatory Visit: Payer: Self-pay | Admitting: Internal Medicine

## 2021-12-01 ENCOUNTER — Telehealth: Payer: Self-pay | Admitting: *Deleted

## 2021-12-01 ENCOUNTER — Ambulatory Visit: Payer: Medicare Other

## 2021-12-01 DIAGNOSIS — R42 Dizziness and giddiness: Secondary | ICD-10-CM

## 2021-12-01 NOTE — Telephone Encounter (Signed)
Pt returning call to [facility]

## 2021-12-01 NOTE — Telephone Encounter (Signed)
Pt returning call to [facility]

## 2021-12-01 NOTE — Progress Notes (Signed)
Order placed for ENT and neurology referral.

## 2021-12-01 NOTE — Telephone Encounter (Signed)
See result note.  

## 2021-12-01 NOTE — Telephone Encounter (Signed)
-----   Message from Einar Pheasant, MD sent at 11/30/2021 11:06 PM EDT ----- Please call and notify Ms Outten that her MRI reveals no acute abnormality.  She has a history of a meningioma (she should already be aware) - slight increase in size, but no mass effect or increased swelling.  Some chronic changes.  No clear reason for her continued dizziness, headache and unsteadiness.  Continue aspirin.  I would like to refer her to neurology for further evaluation and also to ENT for persistent dizziness.  If agreeable, confirm if has preference of who she wants to see.  I will place order for referrals.

## 2021-12-01 NOTE — Telephone Encounter (Signed)
Please transfer to me

## 2021-12-05 ENCOUNTER — Telehealth: Payer: Self-pay

## 2021-12-05 ENCOUNTER — Other Ambulatory Visit (INDEPENDENT_AMBULATORY_CARE_PROVIDER_SITE_OTHER): Payer: Medicare Other

## 2021-12-05 ENCOUNTER — Telehealth: Payer: Self-pay | Admitting: Internal Medicine

## 2021-12-05 ENCOUNTER — Other Ambulatory Visit: Payer: Self-pay

## 2021-12-05 ENCOUNTER — Ambulatory Visit (INDEPENDENT_AMBULATORY_CARE_PROVIDER_SITE_OTHER): Payer: Medicare Other

## 2021-12-05 ENCOUNTER — Other Ambulatory Visit: Payer: Self-pay | Admitting: Oncology

## 2021-12-05 DIAGNOSIS — D509 Iron deficiency anemia, unspecified: Secondary | ICD-10-CM | POA: Insufficient documentation

## 2021-12-05 DIAGNOSIS — D508 Other iron deficiency anemias: Secondary | ICD-10-CM

## 2021-12-05 DIAGNOSIS — D649 Anemia, unspecified: Secondary | ICD-10-CM | POA: Diagnosis not present

## 2021-12-05 DIAGNOSIS — M810 Age-related osteoporosis without current pathological fracture: Secondary | ICD-10-CM | POA: Diagnosis not present

## 2021-12-05 LAB — CBC WITH DIFFERENTIAL/PLATELET
Basophils Absolute: 0.1 10*3/uL (ref 0.0–0.1)
Basophils Relative: 1.2 % (ref 0.0–3.0)
Eosinophils Absolute: 0.2 10*3/uL (ref 0.0–0.7)
Eosinophils Relative: 4.4 % (ref 0.0–5.0)
HCT: 24 % — ABNORMAL LOW (ref 36.0–46.0)
Hemoglobin: 7.6 g/dL — CL (ref 12.0–15.0)
Lymphocytes Relative: 21.3 % (ref 12.0–46.0)
Lymphs Abs: 1.1 10*3/uL (ref 0.7–4.0)
MCHC: 31.8 g/dL (ref 30.0–36.0)
MCV: 95.8 fl (ref 78.0–100.0)
Monocytes Absolute: 0.6 10*3/uL (ref 0.1–1.0)
Monocytes Relative: 11.1 % (ref 3.0–12.0)
Neutro Abs: 3.2 10*3/uL (ref 1.4–7.7)
Neutrophils Relative %: 62 % (ref 43.0–77.0)
Platelets: 314 10*3/uL (ref 150.0–400.0)
RBC: 2.51 Mil/uL — ABNORMAL LOW (ref 3.87–5.11)
RDW: 15.7 % — ABNORMAL HIGH (ref 11.5–15.5)
WBC: 5.2 10*3/uL (ref 4.0–10.5)

## 2021-12-05 LAB — IBC + FERRITIN
Ferritin: 12.5 ng/mL (ref 10.0–291.0)
Iron: 15 ug/dL — ABNORMAL LOW (ref 42–145)
Saturation Ratios: 2.5 % — ABNORMAL LOW (ref 20.0–50.0)
TIBC: 595 ug/dL — ABNORMAL HIGH (ref 250.0–450.0)
Transferrin: 425 mg/dL — ABNORMAL HIGH (ref 212.0–360.0)

## 2021-12-05 LAB — VITAMIN B12: Vitamin B-12: >1500 pg/mL — ABNORMAL HIGH (ref 211–911)

## 2021-12-05 MED ORDER — DENOSUMAB 60 MG/ML ~~LOC~~ SOSY
60.0000 mg | PREFILLED_SYRINGE | Freq: Once | SUBCUTANEOUS | Status: AC
  Administered 2021-12-05: 60 mg via SUBCUTANEOUS

## 2021-12-05 MED ORDER — ROSUVASTATIN CALCIUM 10 MG PO TABS: 10.0000 mg | ORAL_TABLET | Freq: Every day | ORAL | 1 refills | Status: DC

## 2021-12-05 MED ORDER — SLOW FE 142 (45 FE) MG PO TBCR: EXTENDED_RELEASE_TABLET | ORAL | 2 refills | Status: DC

## 2021-12-05 NOTE — Telephone Encounter (Signed)
error 

## 2021-12-05 NOTE — Telephone Encounter (Signed)
Pt need refill on rosuvastatin sent to [facility]

## 2021-12-05 NOTE — Telephone Encounter (Signed)
See other note

## 2021-12-05 NOTE — Telephone Encounter (Signed)
Hgb decreased 7.6.  please call and see how she is doing.  Any bleeding?  Any acute change?

## 2021-12-05 NOTE — Telephone Encounter (Signed)
sent 

## 2021-12-05 NOTE — Progress Notes (Signed)
Pt arrived for 6 mon Prolia injection, given in R arm. Pt tolerated injection well, showed no signs of distress nor voiced any concerns.   Instructed pt to head to checkout to sch next dose of Prolia in 6 mon around 06/07/22 or after. Pt verbalized understanding to info.

## 2021-12-05 NOTE — Telephone Encounter (Signed)
CRITICAL VALUE STICKER   CRITICAL VALUE: HGB-7.6 (L)   RECEIVER (on-site recipient of call): Charlene Cowdrey   DATE & TIME NOTIFIED: 12/05/21 @ 3:56pm   MESSENGER (representative from lab): Luna Fuse, RMA   MD NOTIFIED: Dr. Nicki Reaper   TIME OF NOTIFICATION: 3:56pm   RESPONSE:

## 2021-12-05 NOTE — Telephone Encounter (Signed)
Patient says  X 2 weeks ago she saw a little blood red in color but was constipated at the time she had stopped Metamucil and became constipated and had just a tiny bit red with BM after she started the metamucil bask but no blood now. Denies any black stools .  Patient says she feels as she normally feels.

## 2021-12-05 NOTE — Telephone Encounter (Signed)
Error

## 2021-12-06 ENCOUNTER — Other Ambulatory Visit: Payer: Self-pay

## 2021-12-06 DIAGNOSIS — D649 Anemia, unspecified: Secondary | ICD-10-CM

## 2021-12-06 NOTE — Telephone Encounter (Signed)
Pt scheduled for 7/26 at 9am

## 2021-12-06 NOTE — Telephone Encounter (Signed)
(  Late entry). Spoke to Angela Cohen regarding her continued - decrease in hgb (12/05/21 1800). She feels fine.  Eating.  No nausea or vomiting. No upper GI symptoms.  Reports normally takes metamucil daily.  Stopped taking for a brief period and became constipated.  Took dulcolax and had to strain.  Noticed minimal BRBPR at that time - small amount on tissue.  Started back on metamucil and no bleeding since.  Did notice stool was darker for a brief period, but reports is normal now.  No abdominal pain.  No increased sob or chest pain.  Is currently scheduled to have EGD and colonoscopy in August.  Discussed will contact GI regarding her current decrease in hgb, etc and see if can schedule earlier f/u.  Also contacted Dr Tasia Catchings.  Discussed above.  Pt aware hematology will be contacting her regarding appt for question of need for iron infusion, etc.  Sent in rx for slow Fe and she will start vitamin C '500mg'$  q day.  Will follow closely.  She is aware that if any bleeding or change/worsening symptoms, she is to be evaluated immediately.  Needs a f/u cbc 12/07/21.  Pt aware needs to be drawn, please schedule appt time.

## 2021-12-06 NOTE — Telephone Encounter (Signed)
See attached documentation

## 2021-12-07 ENCOUNTER — Other Ambulatory Visit: Payer: Medicare Other

## 2021-12-14 ENCOUNTER — Inpatient Hospital Stay: Payer: Medicare Other | Attending: Oncology | Admitting: Oncology

## 2021-12-14 ENCOUNTER — Encounter: Payer: Self-pay | Admitting: Oncology

## 2021-12-14 ENCOUNTER — Inpatient Hospital Stay: Payer: Medicare Other

## 2021-12-14 VITALS — BP 140/69 | HR 71

## 2021-12-14 VITALS — BP 132/68 | HR 91 | Temp 97.9°F | Ht 64.0 in | Wt 204.0 lb

## 2021-12-14 DIAGNOSIS — D508 Other iron deficiency anemias: Secondary | ICD-10-CM | POA: Diagnosis not present

## 2021-12-14 DIAGNOSIS — R519 Headache, unspecified: Secondary | ICD-10-CM | POA: Diagnosis not present

## 2021-12-14 DIAGNOSIS — K921 Melena: Secondary | ICD-10-CM | POA: Insufficient documentation

## 2021-12-14 DIAGNOSIS — Z833 Family history of diabetes mellitus: Secondary | ICD-10-CM | POA: Insufficient documentation

## 2021-12-14 DIAGNOSIS — Z8719 Personal history of other diseases of the digestive system: Secondary | ICD-10-CM | POA: Insufficient documentation

## 2021-12-14 DIAGNOSIS — D509 Iron deficiency anemia, unspecified: Secondary | ICD-10-CM | POA: Diagnosis present

## 2021-12-14 DIAGNOSIS — Z801 Family history of malignant neoplasm of trachea, bronchus and lung: Secondary | ICD-10-CM | POA: Diagnosis not present

## 2021-12-14 DIAGNOSIS — Z803 Family history of malignant neoplasm of breast: Secondary | ICD-10-CM | POA: Insufficient documentation

## 2021-12-14 DIAGNOSIS — Z8249 Family history of ischemic heart disease and other diseases of the circulatory system: Secondary | ICD-10-CM | POA: Diagnosis not present

## 2021-12-14 DIAGNOSIS — K648 Other hemorrhoids: Secondary | ICD-10-CM | POA: Diagnosis not present

## 2021-12-14 DIAGNOSIS — I10 Essential (primary) hypertension: Secondary | ICD-10-CM | POA: Diagnosis not present

## 2021-12-14 DIAGNOSIS — E7800 Pure hypercholesterolemia, unspecified: Secondary | ICD-10-CM | POA: Insufficient documentation

## 2021-12-14 DIAGNOSIS — Z823 Family history of stroke: Secondary | ICD-10-CM | POA: Diagnosis not present

## 2021-12-14 DIAGNOSIS — Z79899 Other long term (current) drug therapy: Secondary | ICD-10-CM | POA: Insufficient documentation

## 2021-12-14 MED ORDER — SODIUM CHLORIDE 0.9 % IV SOLN
200.0000 mg | Freq: Once | INTRAVENOUS | Status: AC
  Administered 2021-12-14: 200 mg via INTRAVENOUS
  Filled 2021-12-14: qty 200

## 2021-12-14 MED ORDER — SODIUM CHLORIDE 0.9 % IV SOLN
Freq: Once | INTRAVENOUS | Status: AC
  Filled 2021-12-14: qty 250

## 2021-12-14 NOTE — Progress Notes (Signed)
Hematology/Oncology Consult note [phone] [phone]      Patient Care Team: Einar Pheasant, MD as PCP - General (Internal Medicine) Kate Sable, MD as PCP - Cardiology (Cardiology)   REFERRING PROVIDER: Einar Pheasant, MD  CHIEF COMPLAINTS/REASON FOR VISIT:  Anemia  ASSESSMENT & PLAN:  IDA (iron deficiency anemia) Reviewed labs with patient.  Consistent with severe iron deficiency anemia.  . Recommend IV Venofer treatments. I discussed about the potential risks including but not limited to allergic reactions/infusion reactions including anaphylactic reactions, phlebitis, high blood pressure, wheezing, SOB, skin rash, weight gain, leg swelling, headache, nausea and fatigue, etc. Patient desires to achieved higher level of iron faster for adequate hematopoesis. Plan IV venofer weekly x 5 Suspect GI loss, recommend patient to follow-up with gastroenterology ASAP.   Orders Placed This Encounter  Procedures   CBC with Differential/Platelet    Standing Status:   Future    Standing Expiration Date:   12/15/2022   Ferritin    Standing Status:   Future    Standing Expiration Date:   12/15/2022   Iron and TIBC    Standing Status:   Future    Standing Expiration Date:   12/15/2022   Retic Panel    Standing Status:   Future    Standing Expiration Date:   12/15/2022   Follow up in 8 weeks All questions were answered. The patient knows to call the clinic with any problems, questions or concerns.  Earlie Server, MD, PhD [facility] Hematology Oncology 12/14/2021     HISTORY OF PRESENTING ILLNESS:  Angela Cohen is a  81 y.o.  female with PMH listed below who was referred to me for anemia Reviewed patient's recent labs that was done.  She was found to have abnormal CBC on 12/05/2021, hemoglobin 7.6, this dropped from 9.2  2 weeks prior.  12/05/2021, saturation 2.5, ferritin 12.5, TIBC 595 Reviewed patient's previous labs ordered by primary care physician's  office,  Anemia is new onset.  Patient has a normal hemoglobin of 13  Patient reports feeling very tired, dizziness.  Shortness of breath with exertion. She denies recent chest pain on exertion,  pre-syncopal episodes, or palpitations She had not noticed any recent bleeding such as epistaxis, hematuria.  Occasionally she sees bright red blood in stool.  She has noticed darker stool for brief period of time in the past, now normal. Her primary care provider talk to them and have advised patient to start Slow Fe with vitamin C supplementation while waiting for an appointment to establish with hematology.  Repeat CBC on 12/08/2021, hemoglobin was 7.7. Her last colonoscopy was 09/28/2014 -internal hemorrhoids 04/26/2016, EGD showed gastritis.     MEDICAL HISTORY:  Past Medical History:  Diagnosis Date   Asthma    Fibrocystic breast disease    Gastritis    EGD - schatzki's ring, gastritis and gastric polyps   History of colon polyps    Hypercholesterolemia    Hypertension    MAI (mycobacterium avium-intracellulare) infection ([place])    Osteoporosis    Reflux esophagitis     SURGICAL HISTORY: Past Surgical History:  Procedure Laterality Date   ABDOMINAL HYSTERECTOMY  1998   bladder tact     BREAST EXCISIONAL BIOPSY Bilateral 1970's   benign   BREAST EXCISIONAL BIOPSY Bilateral 2010   pt  stated dr Tamala Julian did bilat axilla lumps neg    BREAST SURGERY     cyst(benign)   CATARACT EXTRACTION W/PHACO Left 06/13/2021   Procedure:  CATARACT EXTRACTION PHACO AND INTRAOCULAR LENS PLACEMENT ([place]) LEFT;  Surgeon: Eulogio Bear, MD;  Location: [place];  Service: Ophthalmology;  Laterality: Left;  7.04 0:40.5   CATARACT EXTRACTION W/PHACO Right 06/27/2021   Procedure: CATARACT EXTRACTION PHACO AND INTRAOCULAR LENS PLACEMENT ([place]) RIGHT;  Surgeon: Eulogio Bear, MD;  Location: [place];  Service: Ophthalmology;  Laterality: Right;  3.32 0:30.7   COLONOSCOPY N/A  09/28/2014   Procedure: COLONOSCOPY;  Surgeon: Manya Silvas, MD;  Location[facility] ENDOSCOPY;  Service: Endoscopy;  Laterality: N/A;   ESOPHAGOGASTRODUODENOSCOPY N/A 09/28/2014   Procedure: ESOPHAGOGASTRODUODENOSCOPY (EGD);  Surgeon: Manya Silvas, MD;  Location: [facility] ENDOSCOPY;  Service: Endoscopy;  Laterality: N/A;   ESOPHAGOGASTRODUODENOSCOPY (EGD) WITH PROPOFOL N/A 04/26/2016   Procedure: ESOPHAGOGASTRODUODENOSCOPY (EGD) WITH PROPOFOL;  Surgeon: Manya Silvas, MD;  Location: [facility] ENDOSCOPY;  Service: Endoscopy;  Laterality: N/A;   excision of right axillary lipoma  10/07   FOOT SURGERY Right    HEMORRHOID SURGERY     HERNIA REPAIR  10/07   NASAL SEPTUM SURGERY      SOCIAL HISTORY: Social History   Socioeconomic History   Marital status: Widowed    Spouse name: Not on file   Number of children: Not on file   Years of education: Not on file   Highest education level: Not on file  Occupational History   Not on file  Tobacco Use   Smoking status: Never   Smokeless tobacco: Never   Tobacco comments:    Never  Vaping Use   Vaping Use: Never used  Substance and Sexual Activity   Alcohol use: No    Alcohol/week: 0.0 standard drinks of alcohol   Drug use: No   Sexual activity: Not on file  Other Topics Concern   Not on file  Social History Narrative   Not on file   Social Determinants of Health   Financial Resource Strain: Not on file  Food Insecurity: Not on file  Transportation Needs: Not on file  Physical Activity: Not on file  Stress: Not on file  Social Connections: Not on file  Intimate Partner Violence: Not on file    FAMILY HISTORY: Family History  Problem Relation Age of Onset   Heart disease Mother 36       heart attack   Cancer Father        lung   Diabetes Sister    Breast cancer Sister 62   Breast cancer Daughter 15   Stroke Brother    Diabetes Brother     ALLERGIES:  has No Known Allergies.  MEDICATIONS:  Current Outpatient  Medications  Medication Sig Dispense Refill   albuterol (VENTOLIN HFA) 108 (90 Base) MCG/ACT inhaler Inhale 2 puffs into the lungs every 6 (six) hours as needed for wheezing or shortness of breath. 3 each 1   aspirin 81 MG EC tablet Take 81 mg by mouth daily. Swallow whole.     Ferrous Sulfate (SLOW FE) 142 (45 Fe) MG TBCR One tablet per day 30 tablet 2   fluticasone furoate-vilanterol (BREO ELLIPTA) 100-25 MCG/INH AEPB Inhale 1 puff into the lungs daily. 29 each 11   Fluticasone-Umeclidin-Vilant (TRELEGY ELLIPTA IN) Inhale into the lungs.     metoprolol succinate (TOPROL-XL) 25 MG 24 hr tablet Take 1 tablet (25 mg total) by mouth 2 (two) times daily. 180 tablet 1   montelukast (SINGULAIR) 10 MG tablet TAKE 1 TABLET BY MOUTH EVERYDAY AT BEDTIME 90 tablet 3   Multiple  Vitamin (MULTIVITAMIN WITH MINERALS) TABS Take 1 tablet by mouth daily.     Omega-3 Fatty Acids (FISH OIL) 1000 MG CAPS Take 3,000 mg by mouth daily.     ondansetron (ZOFRAN-ODT) 4 MG disintegrating tablet Take 1 tablet (4 mg total) by mouth every 4 (four) hours as needed for nausea or vomiting. 30 tablet 0   pantoprazole (PROTONIX) 20 MG tablet TAKE 1 TABLET BY MOUTH TWICE A DAY 180 tablet 1   rosuvastatin (CRESTOR) 10 MG tablet Take 1 tablet (10 mg total) by mouth daily. 90 tablet 1   No current facility-administered medications for this visit.    Review of Systems  Constitutional:  Positive for fatigue. Negative for appetite change, chills and fever.  HENT:   Negative for hearing loss and voice change.   Eyes:  Negative for eye problems.  Respiratory:  Negative for chest tightness and cough.   Cardiovascular:  Negative for chest pain.  Gastrointestinal:  Positive for blood in stool. Negative for abdominal distention and abdominal pain.  Endocrine: Negative for hot flashes.  Genitourinary:  Negative for difficulty urinating and frequency.   Musculoskeletal:  Negative for arthralgias.  Skin:  Negative for itching and rash.   Neurological:  Negative for extremity weakness.  Hematological:  Negative for adenopathy.  Psychiatric/Behavioral:  Negative for confusion.     PHYSICAL EXAMINATION: ECOG PERFORMANCE STATUS: 1 - Symptomatic but completely ambulatory Vitals:   12/14/21 1109  BP: 132/68  Pulse: 91  Temp: 97.9 F (36.6 C)   Filed Weights   12/14/21 1109  Weight: 204 lb (92.5 kg)    Physical Exam Constitutional:      General: She is not in acute distress. HENT:     Head: Normocephalic and atraumatic.  Eyes:     General: No scleral icterus. Cardiovascular:     Rate and Rhythm: Normal rate and regular rhythm.     Heart sounds: Normal heart sounds.  Pulmonary:     Effort: Pulmonary effort is normal. No respiratory distress.     Breath sounds: No wheezing.  Abdominal:     General: Bowel sounds are normal. There is no distension.     Palpations: Abdomen is soft.  Musculoskeletal:        General: No deformity. Normal range of motion.     Cervical back: Normal range of motion and neck supple.  Skin:    General: Skin is warm and dry.     Coloration: Skin is pale.     Findings: No erythema or rash.  Neurological:     Mental Status: She is alert and oriented to person, place, and time. Mental status is at baseline.     Cranial Nerves: No cranial nerve deficit.     Coordination: Coordination normal.  Psychiatric:        Mood and Affect: Mood normal.      LABORATORY DATA:  I have reviewed the data as listed    Latest Ref Rng & Units 12/05/2021    1:59 PM 11/29/2021    8:39 AM 05/10/2021    9:11 AM  CBC  WBC 4.0 - 10.5 K/uL 5.2  4.3  4.6   Hemoglobin 12.0 - 15.0 g/dL 7.6 Repeated and verified X2.  9.2  13.0   Hematocrit 36.0 - 46.0 % 24.0  29.5  39.8   Platelets 150.0 - 400.0 K/uL 314.0  291.0  258.0       Latest Ref Rng & Units 11/29/2021    8:39 AM 05/10/2021  9:11 AM 11/01/2020    8:17 AM  CMP  Glucose 70 - 99 mg/dL 89  116  119   BUN 6 - 23 mg/dL '19  16  12   '$ Creatinine  0.40 - 1.20 mg/dL 0.87  0.91  0.82   Sodium 135 - 145 mEq/L 140  141  142   Potassium 3.5 - 5.1 mEq/L 4.9  4.8  4.8   Chloride 96 - 112 mEq/L 104  103  104   CO2 19 - 32 mEq/L '30  30  30   '$ Calcium 8.4 - 10.5 mg/dL 9.0  9.5  9.1   Total Protein 6.0 - 8.3 g/dL 6.5  7.2  7.0   Total Bilirubin 0.2 - 1.2 mg/dL 0.2  0.5  0.6   Alkaline Phos 39 - 117 U/L 62  64  77   AST 0 - 37 U/L '21  22  23   '$ ALT 0 - 35 U/L '23  26  27       '$ Component Value Date/Time   IRON 15 (L) 12/05/2021 1359   TIBC 595.0 (H) 12/05/2021 1359   FERRITIN 12.5 12/05/2021 1359   IRONPCTSAT 2.5 (L) 12/05/2021 1359     RADIOGRAPHIC STUDIES: I have personally reviewed the radiological images as listed and agreed with the findings in the report. MR Brain W Wo Contrast  Result Date: 11/30/2021 CLINICAL DATA:  Fall and concussion in April; headaches and dizziness; history of meningioma EXAM: MRI HEAD WITHOUT AND WITH CONTRAST TECHNIQUE: Multiplanar, multiecho pulse sequences of the brain and surrounding structures were obtained without and with intravenous contrast. CONTRAST:  28m GADAVIST GADOBUTROL 1 MMOL/ML IV SOLN COMPARISON:  MRI head 05/05/2015, CT head 08/16/2021 FINDINGS: Brain: No restricted diffusion to suggest acute or subacute infarct. No acute hemorrhage, mass, mass effect, or midline shift. No hydrocephalus or extra-axial collection. No abnormal parenchymal enhancement. Redemonstrated midline planum sphenoidale meningioma, which measures up to 1.4 x 0.9 x 0.4 cm (AP x TR x CC) (series 18, image 69 and series 20, image 13), previously 1.0 x 0.8 x 0.4 cm when remeasured similarly. No significant mass effect on the inferior frontal lobes, with no definite edema in the adjacent parenchyma. Confluent T2 hyperintense signal in the periventricular white matter, likely the sequela of moderate chronic small vessel ischemic disease, which has progressed since the prior exam. Mildly advanced cerebral atrophy for age. Vascular: Normal  arterial flow voids. Normal arterial and venous enhancement. Skull and upper cervical spine: Normal marrow signal. Sinuses/Orbits: No acute finding. Status post bilateral lens replacements. Other: The mastoids are well aerated. IMPRESSION: 1. Slight interval increase in the size of a planum sphenoidale meningioma compared to 2016, without evidence of significant mass effect or edema. 2. No acute intracranial process. No etiology is seen for the patient's headaches or dizziness. Electronically Signed   By: AMerilyn BabaM.D.   On: 11/30/2021 16:18

## 2021-12-14 NOTE — Patient Instructions (Signed)

## 2021-12-14 NOTE — Assessment & Plan Note (Addendum)
Reviewed labs with patient.  Consistent with severe iron deficiency anemia.  . Recommend IV Venofer treatments. I discussed about the potential risks including but not limited to allergic reactions/infusion reactions including anaphylactic reactions, phlebitis, high blood pressure, wheezing, SOB, skin rash, weight gain, leg swelling, headache, nausea and fatigue, etc. Patient desires to achieved higher level of iron faster for adequate hematopoesis. Plan IV venofer weekly x 5 Suspect GI loss, recommend patient to follow-up with gastroenterology ASAP.

## 2021-12-19 ENCOUNTER — Inpatient Hospital Stay: Payer: Medicare Other

## 2021-12-19 VITALS — BP 158/73 | HR 83 | Temp 96.0°F | Resp 18

## 2021-12-19 DIAGNOSIS — D508 Other iron deficiency anemias: Secondary | ICD-10-CM

## 2021-12-19 DIAGNOSIS — D509 Iron deficiency anemia, unspecified: Secondary | ICD-10-CM | POA: Diagnosis not present

## 2021-12-19 MED ORDER — SODIUM CHLORIDE 0.9 % IV SOLN
Freq: Once | INTRAVENOUS | Status: AC
  Filled 2021-12-19: qty 250

## 2021-12-19 MED ORDER — SODIUM CHLORIDE 0.9 % IV SOLN
200.0000 mg | Freq: Once | INTRAVENOUS | Status: AC
  Administered 2021-12-19: 200 mg via INTRAVENOUS
  Filled 2021-12-19: qty 200

## 2021-12-22 ENCOUNTER — Inpatient Hospital Stay: Payer: Medicare Other

## 2021-12-22 VITALS — BP 148/74 | HR 83 | Temp 97.1°F | Resp 18

## 2021-12-22 DIAGNOSIS — D509 Iron deficiency anemia, unspecified: Secondary | ICD-10-CM | POA: Diagnosis not present

## 2021-12-22 DIAGNOSIS — D508 Other iron deficiency anemias: Secondary | ICD-10-CM

## 2021-12-22 MED ORDER — SODIUM CHLORIDE 0.9 % IV SOLN
200.0000 mg | Freq: Once | INTRAVENOUS | Status: AC
  Administered 2021-12-22: 200 mg via INTRAVENOUS
  Filled 2021-12-22: qty 200

## 2021-12-22 MED ORDER — SODIUM CHLORIDE 0.9 % IV SOLN
Freq: Once | INTRAVENOUS | Status: AC
  Filled 2021-12-22: qty 250

## 2021-12-24 IMAGING — DX DG LUMBAR SPINE 2-3V
3 series · 3 of 3 positions shown · non-contrast
Comparison: None.

CLINICAL DATA: Low back pain radiating down left leg

EXAM:
LUMBAR SPINE - 2-3 VIEW

[lumbar spine ap]
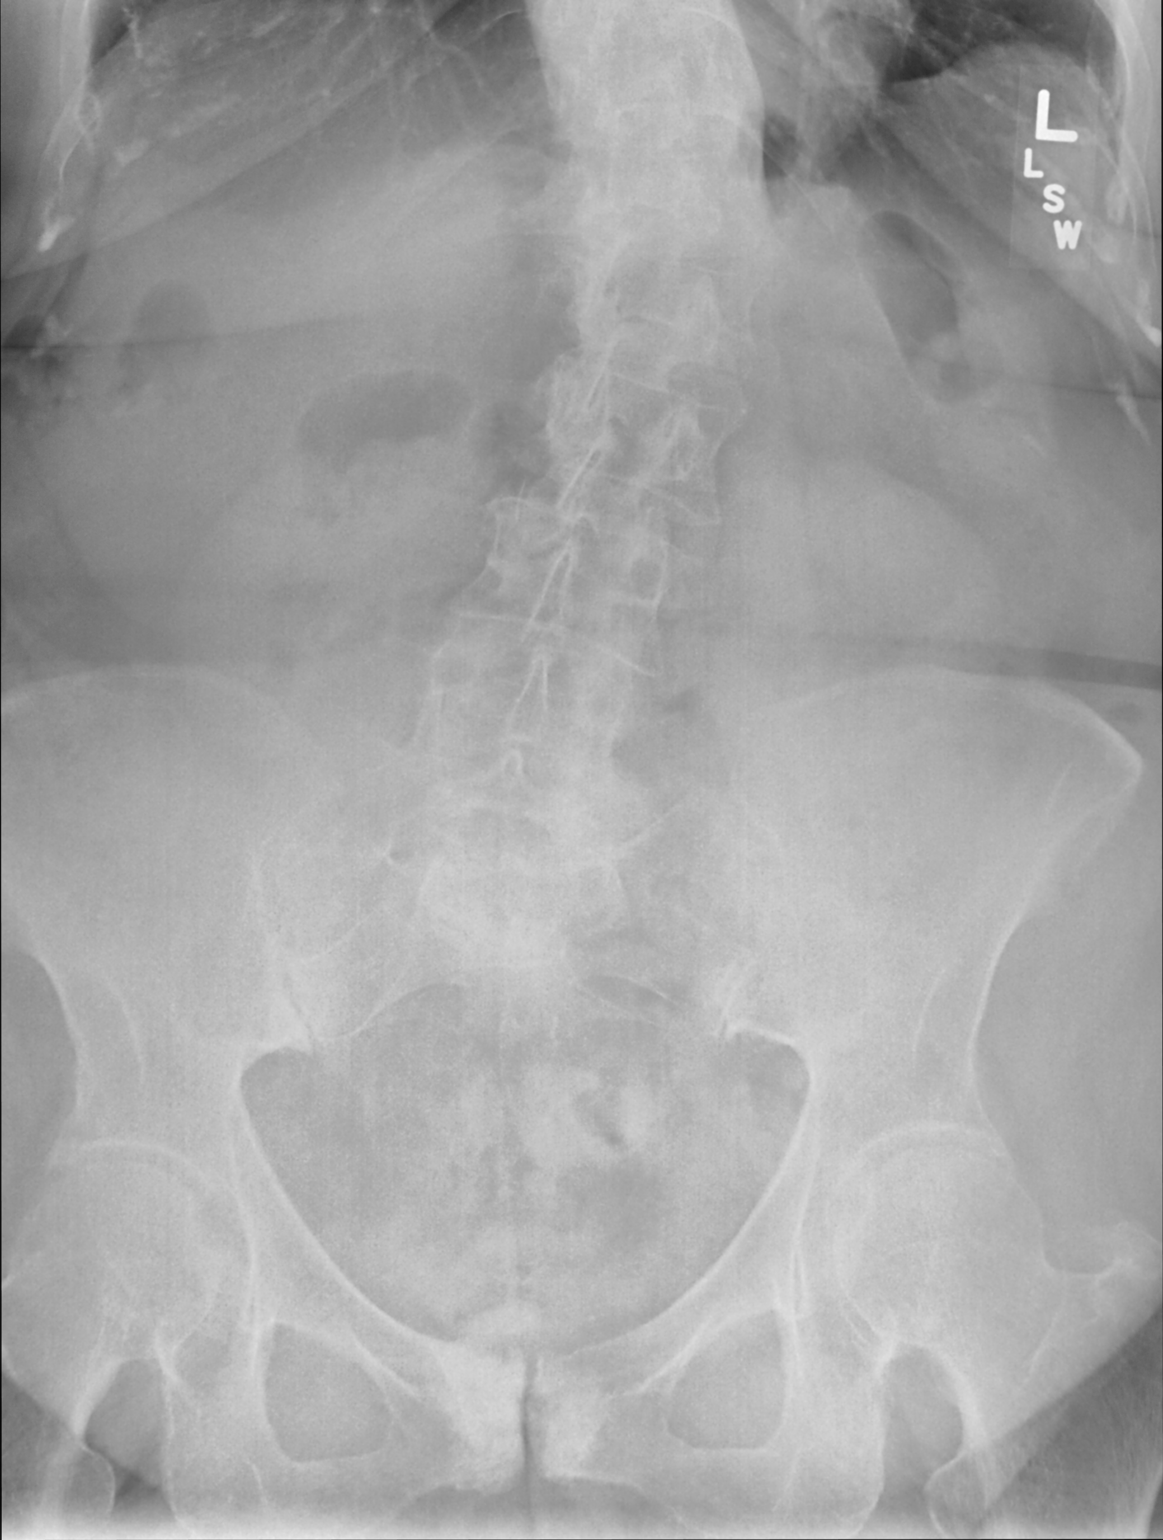

[lumbar spine lat]
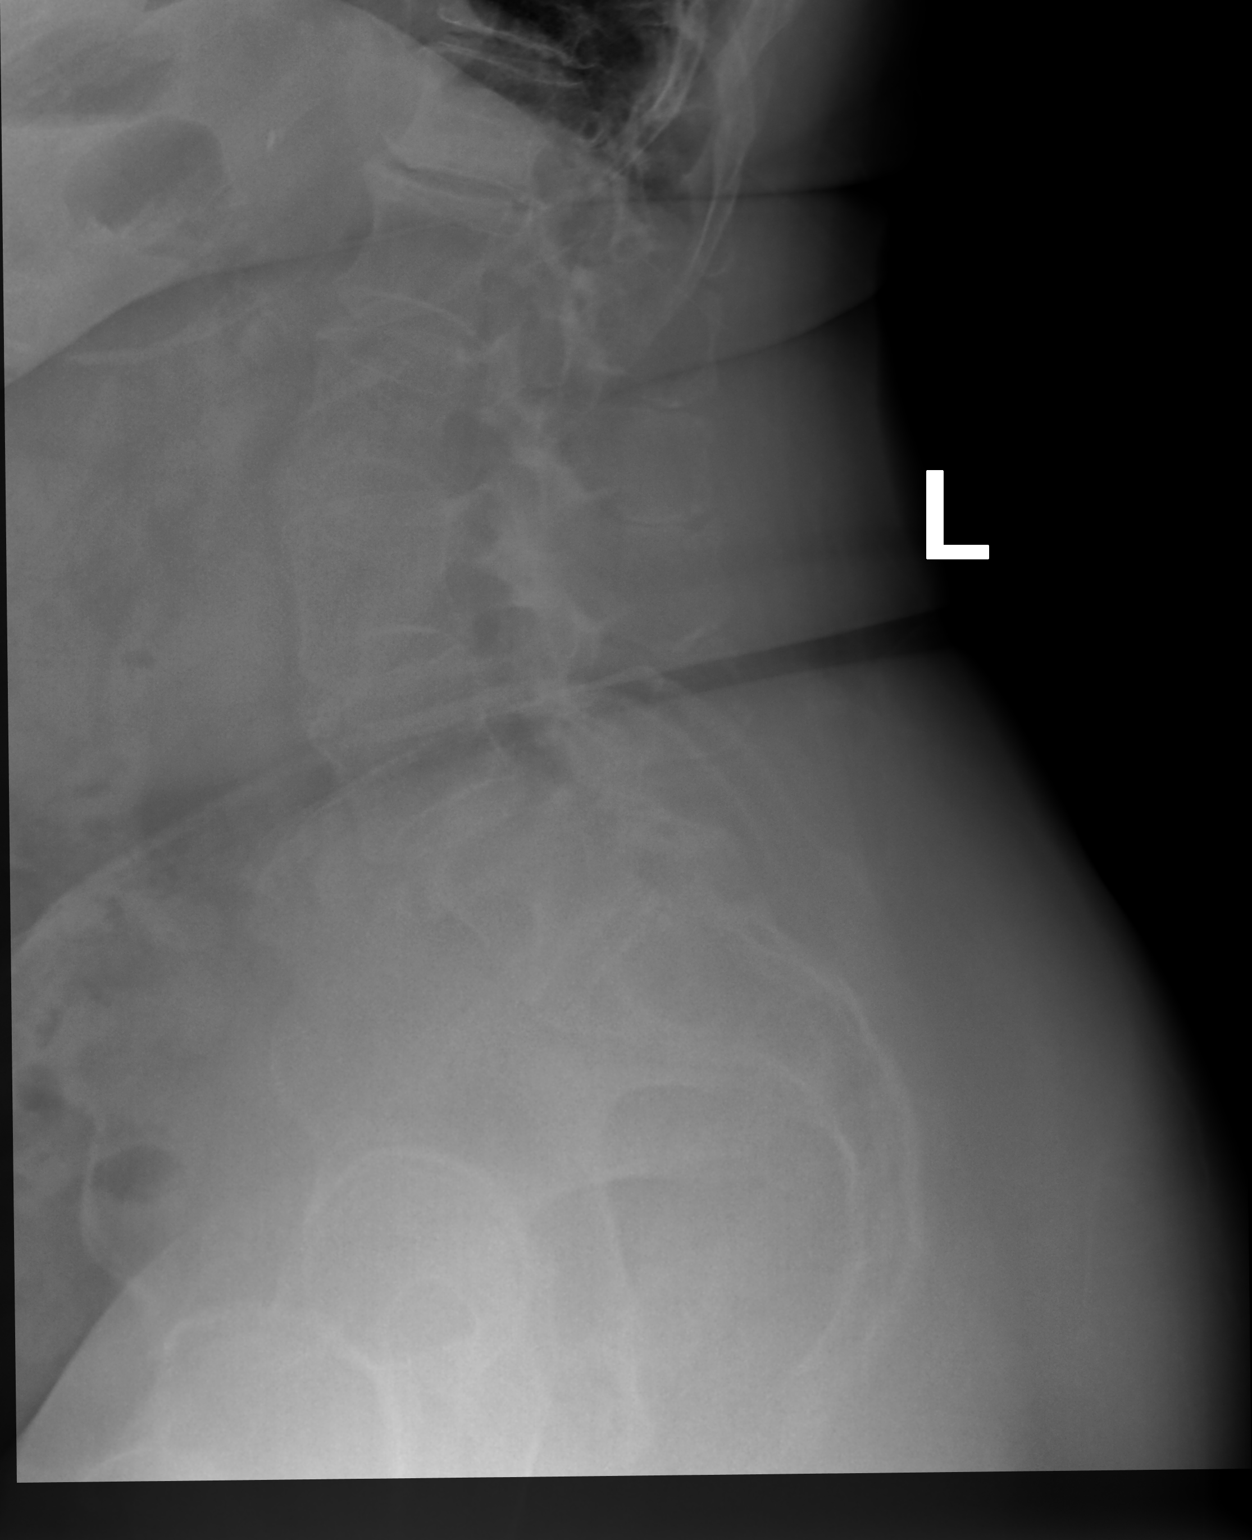

[lumbar spot lat]
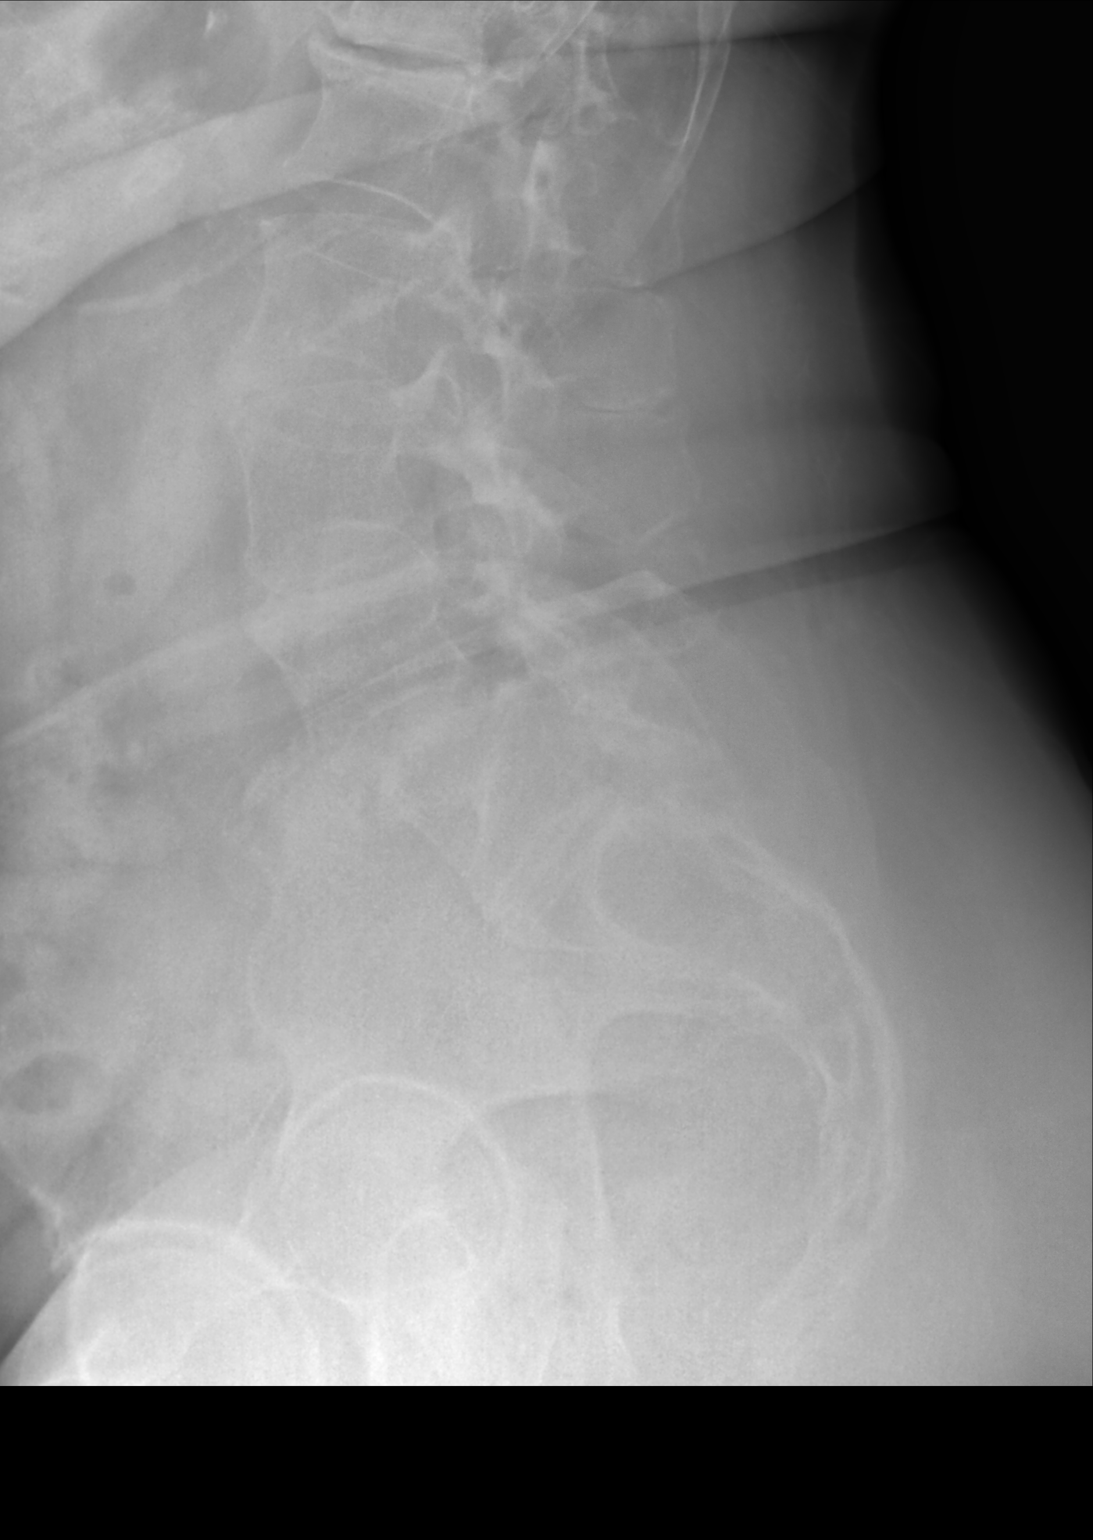

[3 of 3 positions shown; findings below may reference images not displayed]

FINDINGS: Levoscoliosis of the lumbar spine, apex L1-L2. Moderate multilevel
disc space height loss and osteophytosis. Mild to moderate
multilevel facet degenerative change. Nonobstructive pattern of
overlying bowel gas.
IMPRESSION: 1.  Levoscoliosis of the lumbar spine, apex L1-L2.

2. Moderate multilevel disc space height loss and osteophytosis.
Mild to moderate multilevel facet degenerative change. Lumbar disc
and neural foraminal pathology may be further evaluated by MRI if
indicated by neurologically localizing signs and symptoms.

## 2021-12-26 ENCOUNTER — Inpatient Hospital Stay: Payer: Medicare Other

## 2021-12-26 VITALS — BP 155/71 | HR 81 | Temp 96.0°F | Resp 17

## 2021-12-26 DIAGNOSIS — D508 Other iron deficiency anemias: Secondary | ICD-10-CM

## 2021-12-26 DIAGNOSIS — D509 Iron deficiency anemia, unspecified: Secondary | ICD-10-CM | POA: Diagnosis not present

## 2021-12-26 MED ORDER — SODIUM CHLORIDE 0.9% FLUSH
10.0000 mL | Freq: Once | INTRAVENOUS | Status: AC | PRN
  Administered 2021-12-26: 10 mL
  Filled 2021-12-26: qty 10

## 2021-12-26 MED ORDER — SODIUM CHLORIDE 0.9 % IV SOLN
Freq: Once | INTRAVENOUS | Status: AC
  Filled 2021-12-26: qty 250

## 2021-12-26 MED ORDER — SODIUM CHLORIDE 0.9 % IV SOLN
200.0000 mg | Freq: Once | INTRAVENOUS | Status: AC
  Administered 2021-12-26: 200 mg via INTRAVENOUS
  Filled 2021-12-26: qty 200

## 2021-12-26 NOTE — Patient Instructions (Signed)

## 2021-12-29 ENCOUNTER — Inpatient Hospital Stay: Payer: Medicare Other

## 2021-12-29 VITALS — BP 154/79 | HR 80 | Resp 17

## 2021-12-29 DIAGNOSIS — D509 Iron deficiency anemia, unspecified: Secondary | ICD-10-CM | POA: Diagnosis not present

## 2021-12-29 DIAGNOSIS — D508 Other iron deficiency anemias: Secondary | ICD-10-CM

## 2021-12-29 MED ORDER — SODIUM CHLORIDE 0.9% FLUSH
10.0000 mL | Freq: Once | INTRAVENOUS | Status: DC | PRN
  Filled 2021-12-29: qty 10

## 2021-12-29 MED ORDER — SODIUM CHLORIDE 0.9 % IV SOLN
Freq: Once | INTRAVENOUS | Status: AC
  Filled 2021-12-29: qty 250

## 2021-12-29 MED ORDER — SODIUM CHLORIDE 0.9 % IV SOLN
200.0000 mg | Freq: Once | INTRAVENOUS | Status: AC
  Administered 2021-12-29: 200 mg via INTRAVENOUS
  Filled 2021-12-29: qty 200

## 2021-12-29 NOTE — Patient Instructions (Signed)

## 2021-12-30 ENCOUNTER — Encounter: Payer: Self-pay | Admitting: *Deleted

## 2022-01-02 ENCOUNTER — Ambulatory Visit: Payer: Medicare Other | Admitting: Certified Registered"

## 2022-01-02 ENCOUNTER — Ambulatory Visit
Admission: RE | Admit: 2022-01-02 | Discharge: 2022-01-02 | Disposition: A | Discharge status: Home / Self Care | Payer: Medicare Other | Attending: Gastroenterology | Admitting: Gastroenterology

## 2022-01-02 ENCOUNTER — Encounter: Payer: Self-pay | Admitting: *Deleted

## 2022-01-02 ENCOUNTER — Encounter
Admission: RE | Disposition: A | Discharge status: Home / Self Care | Payer: Self-pay | Source: Home / Self Care | Attending: Gastroenterology

## 2022-01-02 DIAGNOSIS — J45909 Unspecified asthma, uncomplicated: Secondary | ICD-10-CM | POA: Diagnosis not present

## 2022-01-02 DIAGNOSIS — K3189 Other diseases of stomach and duodenum: Secondary | ICD-10-CM | POA: Insufficient documentation

## 2022-01-02 DIAGNOSIS — D509 Iron deficiency anemia, unspecified: Secondary | ICD-10-CM | POA: Diagnosis present

## 2022-01-02 DIAGNOSIS — K2970 Gastritis, unspecified, without bleeding: Secondary | ICD-10-CM | POA: Diagnosis not present

## 2022-01-02 DIAGNOSIS — I10 Essential (primary) hypertension: Secondary | ICD-10-CM | POA: Diagnosis not present

## 2022-01-02 DIAGNOSIS — E663 Overweight: Secondary | ICD-10-CM | POA: Diagnosis not present

## 2022-01-02 DIAGNOSIS — D123 Benign neoplasm of transverse colon: Secondary | ICD-10-CM | POA: Insufficient documentation

## 2022-01-02 DIAGNOSIS — K259 Gastric ulcer, unspecified as acute or chronic, without hemorrhage or perforation: Secondary | ICD-10-CM | POA: Insufficient documentation

## 2022-01-02 DIAGNOSIS — Z6831 Body mass index (BMI) 31.0-31.9, adult: Secondary | ICD-10-CM | POA: Insufficient documentation

## 2022-01-02 DIAGNOSIS — K449 Diaphragmatic hernia without obstruction or gangrene: Secondary | ICD-10-CM | POA: Insufficient documentation

## 2022-01-02 DIAGNOSIS — K2100 Gastro-esophageal reflux disease with esophagitis, without bleeding: Secondary | ICD-10-CM | POA: Insufficient documentation

## 2022-01-02 HISTORY — PX: ESOPHAGOGASTRODUODENOSCOPY (EGD) WITH PROPOFOL: SHX5813

## 2022-01-02 HISTORY — PX: COLONOSCOPY WITH PROPOFOL: SHX5780

## 2022-01-02 SURGERY — ESOPHAGOGASTRODUODENOSCOPY (EGD) WITH PROPOFOL
Anesthesia: General

## 2022-01-02 MED ORDER — GLYCOPYRROLATE 0.2 MG/ML IJ SOLN
INTRAMUSCULAR | Status: DC | PRN
  Administered 2022-01-02: .2 mg via INTRAVENOUS

## 2022-01-02 MED ORDER — PROPOFOL 10 MG/ML IV BOLUS
INTRAVENOUS | Status: DC | PRN
  Administered 2022-01-02 (×2): 20 mg via INTRAVENOUS
  Administered 2022-01-02: 60 mg via INTRAVENOUS

## 2022-01-02 MED ORDER — SODIUM CHLORIDE 0.9 % IV SOLN: INTRAVENOUS | Status: DC

## 2022-01-02 MED ORDER — LIDOCAINE HCL (CARDIAC) PF 100 MG/5ML IV SOSY
PREFILLED_SYRINGE | INTRAVENOUS | Status: DC | PRN
  Administered 2022-01-02: 100 mg via INTRAVENOUS

## 2022-01-02 MED ORDER — PROPOFOL 500 MG/50ML IV EMUL
INTRAVENOUS | Status: DC | PRN
  Administered 2022-01-02: 145 ug/kg/min via INTRAVENOUS

## 2022-01-02 NOTE — Anesthesia Procedure Notes (Signed)
Procedure Name: General with mask airway Date/Time: 01/02/2022 9:50 AM  Performed by: Kelton Pillar, CRNAPre-anesthesia Checklist: Patient identified, Emergency Drugs available, Suction available and Patient being monitored Patient Re-evaluated:Patient Re-evaluated prior to induction Oxygen Delivery Method: Simple face mask Induction Type: IV induction Placement Confirmation: positive ETCO2, CO2 detector and breath sounds checked- equal and bilateral Dental Injury: Teeth and Oropharynx as per pre-operative assessment

## 2022-01-02 NOTE — H&P (Signed)
Outpatient short stay form Pre-procedure 01/02/2022  Lesly Rubenstein, MD  Primary Physician: Einar Pheasant, MD  Reason for visit:  IDA  History of present illness:    81 y/o lady with history of HLD and GERD here for EGD/Colonoscopy for IDA. No blood thinners. No family history of GI malignancies. History of hysterectomy.   No current facility-administered medications for this encounter.  Medications Prior to Admission  Medication Sig Dispense Refill Last Dose   albuterol (VENTOLIN HFA) 108 (90 Base) MCG/ACT inhaler Inhale 2 puffs into the lungs every 6 (six) hours as needed for wheezing or shortness of breath. 3 each 1 Past Month   aspirin 81 MG EC tablet Take 81 mg by mouth daily. Swallow whole.   Past Month   Fluticasone-Umeclidin-Vilant (TRELEGY ELLIPTA IN) Inhale into the lungs.   Past Week   iron dextran complex in sodium chloride 0.9 % 500 mL Inject into the vein once.   Past Week   metoprolol succinate (TOPROL-XL) 25 MG 24 hr tablet Take 1 tablet (25 mg total) by mouth 2 (two) times daily. 180 tablet 1 01/01/2022   Multiple Vitamin (MULTIVITAMIN WITH MINERALS) TABS Take 1 tablet by mouth daily.   Past Week   Omega-3 Fatty Acids (FISH OIL) 1000 MG CAPS Take 3,000 mg by mouth daily.   Past Week   pantoprazole (PROTONIX) 20 MG tablet TAKE 1 TABLET BY MOUTH TWICE A DAY 180 tablet 1 01/01/2022   rosuvastatin (CRESTOR) 10 MG tablet Take 1 tablet (10 mg total) by mouth daily. 90 tablet 1 Past Week   Ferrous Sulfate (SLOW FE) 142 (45 Fe) MG TBCR One tablet per day 30 tablet 2    fluticasone furoate-vilanterol (BREO ELLIPTA) 100-25 MCG/INH AEPB Inhale 1 puff into the lungs daily. (Patient not taking: Reported on 01/02/2022) 29 each 11 Not Taking   montelukast (SINGULAIR) 10 MG tablet TAKE 1 TABLET BY MOUTH EVERYDAY AT BEDTIME (Patient not taking: Reported on 01/02/2022) 90 tablet 3 Not Taking   ondansetron (ZOFRAN-ODT) 4 MG disintegrating tablet Take 1 tablet (4 mg total) by mouth every 4  (four) hours as needed for nausea or vomiting. 30 tablet 0      No Known Allergies   Past Medical History:  Diagnosis Date   Asthma    Fibrocystic breast disease    Gastritis    EGD - schatzki's ring, gastritis and gastric polyps   History of colon polyps    Hypercholesterolemia    Hypertension    MAI (mycobacterium avium-intracellulare) infection ([place])    Osteoporosis    Reflux esophagitis     Review of systems:  Otherwise negative.    Physical Exam  Gen: Alert, oriented. Appears stated age.  HEENT: PERRLA. Lungs: No respiratory distress CV: RRR Abd: soft, benign, no masses Ext: No edema    Planned procedures: Proceed with EGD/colonoscopy. The patient understands the nature of the planned procedure, indications, risks, alternatives and potential complications including but not limited to bleeding, infection, perforation, damage to internal organs and possible oversedation/side effects from anesthesia. The patient agrees and gives consent to proceed.  Please refer to procedure notes for findings, recommendations and patient disposition/instructions.     Lesly Rubenstein, MD [facility] Gastroenterology

## 2022-01-02 NOTE — Interval H&P Note (Signed)
History and Physical Interval Note:  01/02/2022 9:35 AM  Angela Cohen  has presented today for surgery, with the diagnosis of Schatzkis Ring GERD Dysphagia Hx colon polyps.  The various methods of treatment have been discussed with the patient and family. After consideration of risks, benefits and other options for treatment, the patient has consented to  Procedure(s): ESOPHAGOGASTRODUODENOSCOPY (EGD) WITH PROPOFOL (N/A) COLONOSCOPY WITH PROPOFOL (N/A) as a surgical intervention.  The patient's history has been reviewed, patient examined, no change in status, stable for surgery.  I have reviewed the patient's chart and labs.  Questions were answered to the patient's satisfaction.     Lesly Rubenstein  Ok to proceed with EGD/Colonoscopy

## 2022-01-02 NOTE — Transfer of Care (Signed)
Immediate Anesthesia Transfer of Care Note  Patient: Angela Cohen  Procedure(s) Performed: ESOPHAGOGASTRODUODENOSCOPY (EGD) WITH PROPOFOL COLONOSCOPY WITH PROPOFOL  Patient Location: Endoscopy Unit  Anesthesia Type:General  Level of Consciousness: awake, drowsy and patient cooperative  Airway & Oxygen Therapy: Patient Spontanous Breathing and Patient connected to face mask oxygen  Post-op Assessment: Report given to RN and Post -op Vital signs reviewed and stable  Post vital signs: Reviewed and stable  Last Vitals:  Vitals Value Taken Time  BP 103/44 01/02/22 1025  Temp 35.9 C 01/02/22 1025  Pulse 84 01/02/22 1025  Resp 31 01/02/22 1025  SpO2 100 % 01/02/22 1025  Vitals shown include unvalidated device data.  Last Pain:  Vitals:   01/02/22 1025  TempSrc: Tympanic  PainSc: Asleep         Complications: No notable events documented.

## 2022-01-02 NOTE — Anesthesia Preprocedure Evaluation (Signed)
Anesthesia Evaluation  Patient identified by MRN, date of birth, ID band Patient awake    Reviewed: Allergy & Precautions, H&P , NPO status , Patient's Chart, lab work & pertinent test results  Airway Mallampati: II  TM Distance: >3 FB Neck ROM: full    Dental no notable dental hx.    Pulmonary asthma ,    Pulmonary exam normal breath sounds clear to auscultation       Cardiovascular hypertension, Normal cardiovascular exam Rhythm:regular Rate:Normal     Neuro/Psych  Headaches,    GI/Hepatic GERD  Medicated and Controlled,  Endo/Other  overweight  Renal/GU      Musculoskeletal   Abdominal   Peds  Hematology   Anesthesia Other Findings   Reproductive/Obstetrics                             Anesthesia Physical  Anesthesia Plan  ASA: 2  Anesthesia Plan: General   Post-op Pain Management: Minimal or no pain anticipated   Induction: Intravenous  PONV Risk Score and Plan: 3 and TIVA and Propofol infusion  Airway Management Planned: Nasal Cannula and Natural Airway  Additional Equipment: None  Intra-op Plan:   Post-operative Plan:   Informed Consent: I have reviewed the patients History and Physical, chart, labs and discussed the procedure including the risks, benefits and alternatives for the proposed anesthesia with the patient or authorized representative who has indicated his/her understanding and acceptance.     Dental Advisory Given  Plan Discussed with: CRNA  Anesthesia Plan Comments: (Discussed risks of anesthesia with patient, including possibility of difficulty with spontaneous ventilation under anesthesia necessitating airway intervention, PONV, and rare risks such as cardiac or respiratory or neurological events, and allergic reactions. Discussed the role of CRNA in patient's perioperative care. Patient understands.)        Anesthesia Quick Evaluation

## 2022-01-02 NOTE — Op Note (Signed)
[facility] Gastroenterology Patient Name: Angela Cohen Procedure Date: 01/02/2022 9:20 AM MRN: [id number] Account #: [id number] Date of Birth: November 04, 1940 Admit Type: Outpatient Age: 81 Room: [facility] ENDO ROOM 3 Gender: Female Note Status: Finalized Instrument Name: Altamese Cabal Endoscope [id number] Procedure:             Upper GI endoscopy Indications:           Iron deficiency anemia Providers:             Andrey Farmer MD, MD Referring MD:          Andrey Farmer MD, MD (Referring MD) Medicines:             Monitored Anesthesia Care Complications:         No immediate complications. Estimated blood loss:                         Minimal. Procedure:             Pre-Anesthesia Assessment:                        - Prior to the procedure, a History and Physical was                         performed, and patient medications and allergies were                         reviewed. The patient is competent. The risks and                         benefits of the procedure and the sedation options and                         risks were discussed with the patient. All questions                         were answered and informed consent was obtained.                         Patient identification and proposed procedure were                         verified by the physician, the nurse, the                         anesthesiologist, the anesthetist and the technician                         in the endoscopy suite. Mental Status Examination:                         alert and oriented. Airway Examination: normal                         oropharyngeal airway and neck mobility. Respiratory                         Examination: clear to auscultation. CV Examination:  normal. Prophylactic Antibiotics: The patient does not                         require prophylactic antibiotics. Prior                         Anticoagulants: The patient has taken no previous                          anticoagulant or antiplatelet agents. ASA Grade                         Assessment: II - A patient with mild systemic disease.                         After reviewing the risks and benefits, the patient                         was deemed in satisfactory condition to undergo the                         procedure. The anesthesia plan was to use monitored                         anesthesia care (MAC). Immediately prior to                         administration of medications, the patient was                         re-assessed for adequacy to receive sedatives. The                         heart rate, respiratory rate, oxygen saturations,                         blood pressure, adequacy of pulmonary ventilation, and                         response to care were monitored throughout the                         procedure. The physical status of the patient was                         re-assessed after the procedure.                        After obtaining informed consent, the endoscope was                         passed under direct vision. Throughout the procedure,                         the patient's blood pressure, pulse, and oxygen                         saturations were monitored continuously. The Endoscope  was introduced through the mouth, and advanced to the                         second part of duodenum. The upper GI endoscopy was                         accomplished without difficulty. The patient tolerated                         the procedure well. Findings:      LA Grade B (one or more mucosal breaks greater than 5 mm, not extending       between the tops of two mucosal folds) esophagitis with no bleeding was       found.      A 5 cm hiatal hernia with a few Angela Cohen ulcers was found. The proximal       extent of the gastric folds (end of tubular esophagus) was 35 cm from       the incisors. The hiatal narrowing was 40 cm from the incisors.       Localized mildly erythematous mucosa without bleeding was found in the       gastric antrum. Biopsies were taken with a cold forceps for histology.       Estimated blood loss was minimal.      The examined duodenum was normal. Impression:            - LA Grade B reflux esophagitis with no bleeding.                        - 5 cm hiatal hernia with a few Angela Cohen ulcers.                        - Erythematous mucosa in the antrum. Biopsied.                        - Normal examined duodenum. Recommendation:        - Await pathology results.                        - Continue present medications.                        - Perform a colonoscopy today. Procedure Code(s):     --- Professional ---                        [phone], Esophagogastroduodenoscopy, flexible,                         transoral; with biopsy, single or multiple Diagnosis Code(s):     --- Professional ---                        K21.00, Gastro-esophageal reflux disease with                         esophagitis, without bleeding                        K44.9, Diaphragmatic hernia without obstruction or  gangrene                        K25.9, Gastric ulcer, unspecified as acute or chronic,                         without hemorrhage or perforation                        K31.89, Other diseases of stomach and duodenum                        D50.9, Iron deficiency anemia, unspecified CPT copyright 2019 American Medical Association. All rights reserved. The codes documented in this report are preliminary and upon coder review may  be revised to meet current compliance requirements. Andrey Farmer MD, MD 01/02/2022 10:32:33 AM Number of Addenda: 0 Note Initiated On: 01/02/2022 9:20 AM Estimated Blood Loss:  Estimated blood loss was minimal.      [facility]

## 2022-01-02 NOTE — Anesthesia Postprocedure Evaluation (Signed)
Anesthesia Post Note  Patient: Tawonda H Mahurin  Procedure(s) Performed: ESOPHAGOGASTRODUODENOSCOPY (EGD) WITH PROPOFOL COLONOSCOPY WITH PROPOFOL  Patient location during evaluation: Endoscopy Anesthesia Type: General Level of consciousness: awake and alert Pain management: pain level controlled Vital Signs Assessment: post-procedure vital signs reviewed and stable Respiratory status: spontaneous breathing, nonlabored ventilation, respiratory function stable and patient connected to nasal cannula oxygen Cardiovascular status: blood pressure returned to baseline and stable Postop Assessment: no apparent nausea or vomiting Anesthetic complications: no   No notable events documented.   Last Vitals:  Vitals:   01/02/22 1035 01/02/22 1045  BP: (!) 116/51 128/67  Pulse: 94 92  Resp: 20 (!) 21  Temp:    SpO2: 98% 100%    Last Pain:  Vitals:   01/02/22 1045  TempSrc:   PainSc: 0-No pain                 Arita Miss

## 2022-01-02 NOTE — Op Note (Signed)
[facility] Gastroenterology Patient Name: Angela Cohen Procedure Date: 01/02/2022 9:19 AM MRN: [id number] Account #: [id number] Date of Birth: 01/16/1941 Admit Type: Outpatient Age: 81 Room: [facility] ENDO ROOM 3 Gender: Female Note Status: Finalized Instrument Name: Peds Colonoscope [id number] Procedure:             Colonoscopy Indications:           Iron deficiency anemia Providers:             Andrey Farmer MD, MD Referring MD:          Andrey Farmer MD, MD (Referring MD) Medicines:             Monitored Anesthesia Care Complications:         No immediate complications. Estimated blood loss:                         Minimal. Procedure:             Pre-Anesthesia Assessment:                        - Prior to the procedure, a History and Physical was                         performed, and patient medications and allergies were                         reviewed. The patient is competent. The risks and                         benefits of the procedure and the sedation options and                         risks were discussed with the patient. All questions                         were answered and informed consent was obtained.                         Patient identification and proposed procedure were                         verified by the physician, the nurse, the                         anesthesiologist, the anesthetist and the technician                         in the endoscopy suite. Mental Status Examination:                         alert and oriented. Airway Examination: normal                         oropharyngeal airway and neck mobility. Respiratory                         Examination: clear to auscultation. CV Examination:  normal. Prophylactic Antibiotics: The patient does not                         require prophylactic antibiotics. Prior                         Anticoagulants: The patient has taken no previous                          anticoagulant or antiplatelet agents. ASA Grade                         Assessment: II - A patient with mild systemic disease.                         After reviewing the risks and benefits, the patient                         was deemed in satisfactory condition to undergo the                         procedure. The anesthesia plan was to use monitored                         anesthesia care (MAC). Immediately prior to                         administration of medications, the patient was                         re-assessed for adequacy to receive sedatives. The                         heart rate, respiratory rate, oxygen saturations,                         blood pressure, adequacy of pulmonary ventilation, and                         response to care were monitored throughout the                         procedure. The physical status of the patient was                         re-assessed after the procedure.                        After obtaining informed consent, the colonoscope was                         passed under direct vision. Throughout the procedure,                         the patient's blood pressure, pulse, and oxygen                         saturations were monitored continuously. The  Colonoscope was introduced through the anus and                         advanced to the the cecum, identified by appendiceal                         orifice and ileocecal valve. The colonoscopy was                         technically difficult and complex due to restricted                         mobility of the colon. Successful completion of the                         procedure was aided by withdrawing the scope and                         replacing with the pediatric colonoscope. The patient                         tolerated the procedure well. The quality of the bowel                         preparation was fair. Findings:      The perianal and digital rectal  examinations were normal.      A 2 mm polyp was found in the transverse colon. The polyp was sessile.       The polyp was removed with a jumbo cold forceps. Resection and retrieval       were complete. Estimated blood loss was minimal.      The exam was otherwise without abnormality on direct and retroflexion       views. Impression:            - Preparation of the colon was fair.                        - One 2 mm polyp in the transverse colon, removed with                         a jumbo cold forceps. Resected and retrieved.                        - The examination was otherwise normal on direct and                         retroflexion views. Recommendation:        - Discharge patient to home.                        - Resume previous diet.                        - Continue present medications.                        - Await pathology results.                        -  Repeat colonoscopy is not recommended due to current                         age (76 years or older) for surveillance.                        - Return to referring physician as previously                         scheduled. Procedure Code(s):     --- Professional ---                        [phone], Colonoscopy, flexible; with biopsy, single or                         multiple Diagnosis Code(s):     --- Professional ---                        K63.5, Polyp of colon                        D50.9, Iron deficiency anemia, unspecified CPT copyright 2019 American Medical Association. All rights reserved. The codes documented in this report are preliminary and upon coder review may  be revised to meet current compliance requirements. Andrey Farmer MD, MD 01/02/2022 10:39:21 AM Number of Addenda: 0 Note Initiated On: 01/02/2022 9:19 AM Scope Withdrawal Time: 0 hours 7 minutes 50 seconds  Total Procedure Duration: 0 hours 20 minutes 6 seconds  Estimated Blood Loss:  Estimated blood loss was minimal.      [facility]

## 2022-01-03 ENCOUNTER — Encounter: Payer: Self-pay | Admitting: Gastroenterology

## 2022-01-03 LAB — SURGICAL PATHOLOGY

## 2022-01-09 ENCOUNTER — Other Ambulatory Visit: Payer: Self-pay | Admitting: Unknown Physician Specialty

## 2022-01-09 ENCOUNTER — Encounter: Payer: Self-pay | Admitting: Urology

## 2022-01-09 ENCOUNTER — Ambulatory Visit
Admission: RE | Admit: 2022-01-09 | Discharge: 2022-01-09 | Disposition: A | Discharge status: Home / Self Care | Payer: Medicare Other | Source: Ambulatory Visit | Attending: Unknown Physician Specialty | Admitting: Unknown Physician Specialty

## 2022-01-09 ENCOUNTER — Ambulatory Visit (INDEPENDENT_AMBULATORY_CARE_PROVIDER_SITE_OTHER): Payer: Medicare Other | Admitting: Urology

## 2022-01-09 VITALS — BP 153/85 | HR 87 | Ht 64.0 in | Wt 190.0 lb

## 2022-01-09 DIAGNOSIS — N3946 Mixed incontinence: Secondary | ICD-10-CM

## 2022-01-09 DIAGNOSIS — G501 Atypical facial pain: Secondary | ICD-10-CM

## 2022-01-09 LAB — URINALYSIS, COMPLETE
Bilirubin, UA: NEGATIVE
Glucose, UA: NEGATIVE
Ketones, UA: NEGATIVE
Leukocytes,UA: NEGATIVE
Nitrite, UA: NEGATIVE
Protein,UA: NEGATIVE
RBC, UA: NEGATIVE
Specific Gravity, UA: 1.015 (ref 1.005–1.030)
Urobilinogen, Ur: 0.2 mg/dL (ref 0.2–1.0)
pH, UA: 5.5 (ref 5.0–7.5)

## 2022-01-09 LAB — MICROSCOPIC EXAMINATION: Bacteria, UA: NONE SEEN

## 2022-01-09 NOTE — Patient Instructions (Signed)

## 2022-01-09 NOTE — Progress Notes (Signed)
01/09/2022 10:16 AM   Angela Cohen 01-23-1941 [id number]  Referring provider: Einar Pheasant, [address] [place],  [place] [address]  Chief Complaint  Patient presents with   Follow-up   Urinary Incontinence    HPI: Reviewed chart Discussed 3 refractory treatments for mixed incontinence 1 year ago including bulking agents.  I did not give her the new beta 3 agonist due to side effects from Myrbetriq  Still has refractory urgency incontinence.  We talked about InterStim and percutaneous tibial nerve stimulation in detail with her asking  She has been having some upper tract GI bleeding.  She fell and had a concussion.  She has had some epidural and other back issues.    PMH: Past Medical History:  Diagnosis Date   Asthma    Fibrocystic breast disease    Gastritis    EGD - schatzki's ring, gastritis and gastric polyps   History of colon polyps    Hypercholesterolemia    Hypertension    MAI (mycobacterium avium-intracellulare) infection ([place])    Osteoporosis    Reflux esophagitis     Surgical History: Past Surgical History:  Procedure Laterality Date   ABDOMINAL HYSTERECTOMY  1998   bladder tact     BREAST EXCISIONAL BIOPSY Bilateral 1970's   benign   BREAST EXCISIONAL BIOPSY Bilateral 2010   pt  stated dr Tamala Julian did bilat axilla lumps neg    BREAST SURGERY     cyst(benign)   CATARACT EXTRACTION W/PHACO Left 06/13/2021   Procedure: CATARACT EXTRACTION PHACO AND INTRAOCULAR LENS PLACEMENT ([place]) LEFT;  Surgeon: Eulogio Bear, MD;  Location: [place];  Service: Ophthalmology;  Laterality: Left;  7.04 0:40.5   CATARACT EXTRACTION W/PHACO Right 06/27/2021   Procedure: CATARACT EXTRACTION PHACO AND INTRAOCULAR LENS PLACEMENT ([facility]) RIGHT;  Surgeon: Eulogio Bear, MD;  Location: [place];  Service: Ophthalmology;  Laterality: Right;  3.32 0:30.7   COLONOSCOPY N/A 09/28/2014   Procedure: COLONOSCOPY;  Surgeon:  Manya Silvas, MD;  Location: [facility] ENDOSCOPY;  Service: Endoscopy;  Laterality: N/A;   COLONOSCOPY WITH PROPOFOL N/A 01/02/2022   Procedure: COLONOSCOPY WITH PROPOFOL;  Surgeon: Lesly Rubenstein, MD;  Location: [facility] ENDOSCOPY;  Service: Endoscopy;  Laterality: N/A;   ESOPHAGOGASTRODUODENOSCOPY N/A 09/28/2014   Procedure: ESOPHAGOGASTRODUODENOSCOPY (EGD);  Surgeon: Manya Silvas, MD;  Location: [facility] ENDOSCOPY;  Service: Endoscopy;  Laterality: N/A;   ESOPHAGOGASTRODUODENOSCOPY (EGD) WITH PROPOFOL N/A 04/26/2016   Procedure: ESOPHAGOGASTRODUODENOSCOPY (EGD) WITH PROPOFOL;  Surgeon: Manya Silvas, MD;  Location: [facility] ENDOSCOPY;  Service: Endoscopy;  Laterality: N/A;   ESOPHAGOGASTRODUODENOSCOPY (EGD) WITH PROPOFOL N/A 01/02/2022   Procedure: ESOPHAGOGASTRODUODENOSCOPY (EGD) WITH PROPOFOL;  Surgeon: Lesly Rubenstein, MD;  Location: [facility] ENDOSCOPY;  Service: Endoscopy;  Laterality: N/A;   excision of right axillary lipoma  02/2006   EYE SURGERY     FOOT SURGERY Right    HEMORRHOID SURGERY     HERNIA REPAIR  02/2006   NASAL SEPTUM SURGERY      Home Medications:  Allergies as of 01/09/2022   No Known Allergies      Medication List        Accurate as of January 09, 2022 10:16 AM. If you have any questions, ask your nurse or doctor.          STOP taking these medications    fluticasone furoate-vilanterol 100-25 MCG/INH Aepb Commonly known as: Breo Ellipta Stopped by: Reece Packer, MD   montelukast 10 MG tablet Commonly  known as: SINGULAIR Stopped by: Reece Packer, MD       TAKE these medications    albuterol 108 (90 Base) MCG/ACT inhaler Commonly known as: VENTOLIN HFA Inhale 2 puffs into the lungs every 6 (six) hours as needed for wheezing or shortness of breath.   aspirin EC 81 MG tablet Take 81 mg by mouth daily. Swallow whole.   Fish Oil 1000 MG Caps Take 3,000 mg by mouth daily.   iron dextran complex in sodium chloride 0.9 % 500 mL Inject  into the vein once.   metoprolol succinate 25 MG 24 hr tablet Commonly known as: TOPROL-XL Take 1 tablet (25 mg total) by mouth 2 (two) times daily.   multivitamin with minerals Tabs tablet Take 1 tablet by mouth daily.   ondansetron 4 MG disintegrating tablet Commonly known as: ZOFRAN-ODT Take 1 tablet (4 mg total) by mouth every 4 (four) hours as needed for nausea or vomiting.   pantoprazole 20 MG tablet Commonly known as: PROTONIX TAKE 1 TABLET BY MOUTH TWICE A DAY   rosuvastatin 10 MG tablet Commonly known as: CRESTOR Take 1 tablet (10 mg total) by mouth daily.   Slow Fe 142 (45 Fe) MG Tbcr Generic drug: Ferrous Sulfate One tablet per day   TRELEGY ELLIPTA IN Inhale into the lungs.        Allergies: No Known Allergies  Family History: Family History  Problem Relation Age of Onset   Heart disease Mother 77       heart attack   Cancer Father        lung   Diabetes Sister    Breast cancer Sister 17   Breast cancer Daughter 66   Stroke Brother    Diabetes Brother     Social History:  reports that she has never smoked. She has never been exposed to tobacco smoke. She has never used smokeless tobacco. She reports that she does not drink alcohol and does not use drugs.  ROS:                                        Physical Exam: BP (!) 153/85   Pulse 87   Ht '5\' 4"'$  (1.626 m)   Wt 86.2 kg   BMI 32.61 kg/m   Constitutional:  Alert and oriented, No acute distress. HEENT: [place] AT, moist mucus membranes.  Trachea midline, no masses.   Laboratory Data: Lab Results  Component Value Date   WBC 5.2 12/05/2021   HGB 7.6 Repeated and verified X2. (LL) 12/05/2021   HCT 24.0 (L) 12/05/2021   MCV 95.8 12/05/2021   PLT 314.0 12/05/2021    Lab Results  Component Value Date   CREATININE 0.87 11/29/2021    No results found for: "PSA"  No results found for: "TESTOSTERONE"  Lab Results  Component Value Date   HGBA1C 5.8 11/29/2021     Urinalysis    Component Value Date/Time   COLORURINE YELLOW 12/11/2019 1138   APPEARANCEUR Hazy (A) 09/13/2020 0918   LABSPEC 1.025 12/11/2019 1138   PHURINE 6.0 12/11/2019 1138   GLUCOSEU Negative 09/13/2020 [place] 12/11/2019 1138   [place] 12/11/2019 1138   BILIRUBINUR Negative 09/13/2020 [place] 12/11/2019 1138   PROTEINUR Negative 09/13/2020 0918   UROBILINOGEN 0.2 12/11/2019 1138   NITRITE Negative 09/13/2020 0918   NITRITE NEGATIVE 12/11/2019 1138  LEUKOCYTESUR Trace (A) 09/13/2020 0918   LEUKOCYTESUR NEGATIVE 12/11/2019 1138    Pertinent Imaging:   Assessment & Plan: Start percutaneous tibial nerve stimulation.  Can always consider InterStim in the future.  She is on blood thinners.  PTNS a good option based on age and comorbidities  1. Mixed incontinence    No follow-ups on file.  Reece Packer, MD  [place] [address], [place], [place] [address] [phone]

## 2022-01-10 ENCOUNTER — Encounter: Payer: Self-pay | Admitting: Internal Medicine

## 2022-01-27 ENCOUNTER — Telehealth: Payer: Self-pay

## 2022-01-27 NOTE — Telephone Encounter (Signed)
-----   Message from Shanon Ace, [place] sent at 01/19/2022 11:30 AM EDT ----- Regarding: PTNS Dr. Matilde Sprang would like patient to have PTNS   Thank you

## 2022-01-27 NOTE — Telephone Encounter (Signed)
No PA required Medicare A&B. Pt scheduled for 12 PTNS appts starting in November. Pt confirmed.

## 2022-02-06 ENCOUNTER — Inpatient Hospital Stay: Payer: Medicare Other | Attending: Oncology

## 2022-02-06 DIAGNOSIS — D509 Iron deficiency anemia, unspecified: Secondary | ICD-10-CM | POA: Insufficient documentation

## 2022-02-06 DIAGNOSIS — K921 Melena: Secondary | ICD-10-CM | POA: Diagnosis not present

## 2022-02-06 DIAGNOSIS — Z79899 Other long term (current) drug therapy: Secondary | ICD-10-CM | POA: Diagnosis not present

## 2022-02-06 DIAGNOSIS — Z7982 Long term (current) use of aspirin: Secondary | ICD-10-CM | POA: Diagnosis not present

## 2022-02-06 DIAGNOSIS — D508 Other iron deficiency anemias: Secondary | ICD-10-CM

## 2022-02-06 LAB — RETIC PANEL
Immature Retic Fract: 23.1 % — ABNORMAL HIGH (ref 2.3–15.9)
RBC.: 4.1 MIL/uL (ref 3.87–5.11)
Retic Count, Absolute: 87.7 10*3/uL (ref 19.0–186.0)
Retic Ct Pct: 2.1 % (ref 0.4–3.1)
Reticulocyte Hemoglobin: 34.7 pg (ref >27.9)

## 2022-02-06 LAB — CBC WITH DIFFERENTIAL/PLATELET
Abs Immature Granulocytes: 0.02 10*3/uL (ref 0.00–0.07)
Basophils Absolute: 0.1 10*3/uL (ref 0.0–0.1)
Basophils Relative: 1 %
Eosinophils Absolute: 0.5 10*3/uL (ref 0.0–0.5)
Eosinophils Relative: 10 %
HCT: 41.6 % (ref 36.0–46.0)
Hemoglobin: 13.1 g/dL (ref 12.0–15.0)
Immature Granulocytes: 0 %
Lymphocytes Relative: 25 %
Lymphs Abs: 1.2 10*3/uL (ref 0.7–4.0)
MCH: 31.1 pg (ref 26.0–34.0)
MCHC: 31.5 g/dL (ref 30.0–36.0)
MCV: 98.8 fL (ref 80.0–100.0)
Monocytes Absolute: 0.5 10*3/uL (ref 0.1–1.0)
Monocytes Relative: 10 %
Neutro Abs: 2.5 10*3/uL (ref 1.7–7.7)
Neutrophils Relative %: 54 %
Platelets: 241 10*3/uL (ref 150–400)
RBC: 4.21 MIL/uL (ref 3.87–5.11)
RDW: 13.7 % (ref 11.5–15.5)
WBC: 4.7 10*3/uL (ref 4.0–10.5)
nRBC: 0 % (ref 0.0–0.2)

## 2022-02-06 LAB — IRON AND TIBC
Iron: 104 ug/dL (ref 28–170)
Saturation Ratios: 25 % (ref 10.4–31.8)
TIBC: 424 ug/dL (ref 250–450)
UIBC: 320 ug/dL

## 2022-02-06 LAB — FERRITIN: Ferritin: 102 ng/mL (ref 11–307)

## 2022-02-06 MED FILL — Iron Sucrose Inj 20 MG/ML (Fe Equiv): INTRAVENOUS | Qty: 10 | Status: AC

## 2022-02-07 ENCOUNTER — Inpatient Hospital Stay: Payer: Medicare Other

## 2022-02-07 ENCOUNTER — Inpatient Hospital Stay (HOSPITAL_BASED_OUTPATIENT_CLINIC_OR_DEPARTMENT_OTHER): Payer: Medicare Other | Admitting: Oncology

## 2022-02-07 ENCOUNTER — Encounter: Payer: Self-pay | Admitting: Oncology

## 2022-02-07 VITALS — BP 139/69 | HR 85 | Resp 18 | Wt 203.0 lb

## 2022-02-07 DIAGNOSIS — D508 Other iron deficiency anemias: Secondary | ICD-10-CM

## 2022-02-07 DIAGNOSIS — D509 Iron deficiency anemia, unspecified: Secondary | ICD-10-CM | POA: Diagnosis not present

## 2022-02-07 NOTE — Assessment & Plan Note (Signed)
Reviewed labs with patient.   Hemoglobin has improved. No need for additional IV venofer.

## 2022-02-07 NOTE — Progress Notes (Signed)
Hematology/Oncology Consult note [phone] [phone]      Patient Care Team: Einar Pheasant, MD as PCP - General (Internal Medicine) Kate Sable, MD as PCP - Cardiology (Cardiology)   REFERRING PROVIDER: Einar Pheasant, MD  CHIEF COMPLAINTS/REASON FOR VISIT:  Anemia  ASSESSMENT & PLAN:  IDA (iron deficiency anemia) Reviewed labs with patient.   Hemoglobin has improved. No need for additional IV venofer.  Orders Placed This Encounter  Procedures   CBC with Differential/Platelet    Standing Status:   Future    Standing Expiration Date:   02/08/2023   Iron and TIBC    Standing Status:   Future    Standing Expiration Date:   02/08/2023   Ferritin    Standing Status:   Future    Standing Expiration Date:   02/08/2023   Follow up in 6 months.  All questions were answered. The patient knows to call the clinic with any problems, questions or concerns.  Earlie Server, MD, PhD [facility] Hematology Oncology 02/07/2022     HISTORY OF PRESENTING ILLNESS:  Angela Cohen is a  81 y.o.  female with PMH listed below who was referred to me for anemia Reviewed patient's recent labs that was done.  She was found to have abnormal CBC on 12/05/2021, hemoglobin 7.6, this dropped from 9.2  2 weeks prior.  12/05/2021, saturation 2.5, ferritin 12.5, TIBC 595 Reviewed patient's previous labs ordered by primary care physician's office,  Anemia is new onset.  Patient has a normal hemoglobin of 13  Patient reports feeling very tired, dizziness.  Shortness of breath with exertion. She denies recent chest pain on exertion,  pre-syncopal episodes, or palpitations She had not noticed any recent bleeding such as epistaxis, hematuria.  Occasionally she sees bright red blood in stool.  She has noticed darker stool for brief period of time in the past, now normal. Her primary care provider talk to them and have advised patient to start Slow Fe with vitamin C supplementation  while waiting for an appointment to establish with hematology.  Repeat CBC on 12/08/2021, hemoglobin was 7.7. Her last colonoscopy was 09/28/2014 -internal hemorrhoids 04/26/2016, EGD showed gastritis.   INTERVAL HISTORY Angela Cohen is a 81 y.o. female who has above history reviewed by me today presents for follow up visit for iron deficiency anemia. S/p IV Venofer treatments, tolerated well.  Fatigue has improved. No blood in stool.  01/02/22 upper endoscopy showed LA Grade B reflux esophagitis with no bleeding, - 5 cm hiatal hernia with a few Cameron ulcers.- Erythematous mucosa in the antrum. Biopsy showed erosive gastritis Colonoscopy 41m polyp She takes PPI  MEDICAL HISTORY:  Past Medical History:  Diagnosis Date   Asthma    Fibrocystic breast disease    Gastritis    EGD - schatzki's ring, gastritis and gastric polyps   History of colon polyps    Hypercholesterolemia    Hypertension    MAI (mycobacterium avium-intracellulare) infection ([place]    Osteoporosis    Reflux esophagitis     SURGICAL HISTORY: Past Surgical History:  Procedure Laterality Date   ABDOMINAL HYSTERECTOMY  1998   bladder tact     BREAST EXCISIONAL BIOPSY Bilateral 1970's   benign   BREAST EXCISIONAL BIOPSY Bilateral 2010   pt  stated dr sTamala Juliandid bilat axilla lumps neg    BREAST SURGERY     cyst(benign)   CATARACT EXTRACTION W/PHACO Left 06/13/2021   Procedure: CATARACT EXTRACTION PHACO AND INTRAOCULAR LENS PLACEMENT (  IOC) LEFT;  Surgeon: Eulogio Bear, MD;  Location: [facility];  Service: Ophthalmology;  Laterality: Left;  7.04 0:40.5   CATARACT EXTRACTION W/PHACO Right 06/27/2021   Procedure: CATARACT EXTRACTION PHACO AND INTRAOCULAR LENS PLACEMENT (IOC) RIGHT;  Surgeon: Eulogio Bear, MD;  Location: [place];  Service: Ophthalmology;  Laterality: Right;  3.32 0:30.7   COLONOSCOPY N/A 09/28/2014   Procedure: COLONOSCOPY;  Surgeon: Manya Silvas, MD;  Location:  [facility] ENDOSCOPY;  Service: Endoscopy;  Laterality: N/A;   COLONOSCOPY WITH PROPOFOL N/A 01/02/2022   Procedure: COLONOSCOPY WITH PROPOFOL;  Surgeon: Lesly Rubenstein, MD;  Location: [facility] ENDOSCOPY;  Service: Endoscopy;  Laterality: N/A;   ESOPHAGOGASTRODUODENOSCOPY N/A 09/28/2014   Procedure: ESOPHAGOGASTRODUODENOSCOPY (EGD);  Surgeon: Manya Silvas, MD;  Location: [facility] ENDOSCOPY;  Service: Endoscopy;  Laterality: N/A;   ESOPHAGOGASTRODUODENOSCOPY (EGD) WITH PROPOFOL N/A 04/26/2016   Procedure: ESOPHAGOGASTRODUODENOSCOPY (EGD) WITH PROPOFOL;  Surgeon: Manya Silvas, MD;  Location: [facility] ENDOSCOPY;  Service: Endoscopy;  Laterality: N/A;   ESOPHAGOGASTRODUODENOSCOPY (EGD) WITH PROPOFOL N/A 01/02/2022   Procedure: ESOPHAGOGASTRODUODENOSCOPY (EGD) WITH PROPOFOL;  Surgeon: Lesly Rubenstein, MD;  Location: [facility] ENDOSCOPY;  Service: Endoscopy;  Laterality: N/A;   excision of right axillary lipoma  02/2006   EYE SURGERY     FOOT SURGERY Right    HEMORRHOID SURGERY     HERNIA REPAIR  02/2006   NASAL SEPTUM SURGERY      SOCIAL HISTORY: Social History   Socioeconomic History   Marital status: Widowed    Spouse name: Not on file   Number of children: Not on file   Years of education: Not on file   Highest education level: Not on file  Occupational History   Not on file  Tobacco Use   Smoking status: Never    Passive exposure: Never   Smokeless tobacco: Never   Tobacco comments:    Never  Vaping Use   Vaping Use: Never used  Substance and Sexual Activity   Alcohol use: No    Alcohol/week: 0.0 standard drinks of alcohol   Drug use: No   Sexual activity: Not on file  Other Topics Concern   Not on file  Social History Narrative   Not on file   Social Determinants of Health   Financial Resource Strain: Not on file  Food Insecurity: Not on file  Transportation Needs: Not on file  Physical Activity: Not on file  Stress: Not on file  Social Connections: Not on file  Intimate  Partner Violence: Not on file    FAMILY HISTORY: Family History  Problem Relation Age of Onset   Heart disease Mother 63       heart attack   Cancer Father        lung   Diabetes Sister    Breast cancer Sister 61   Breast cancer Daughter 70   Stroke Brother    Diabetes Brother     ALLERGIES:  has No Known Allergies.  MEDICATIONS:  Current Outpatient Medications  Medication Sig Dispense Refill   albuterol (VENTOLIN HFA) 108 (90 Base) MCG/ACT inhaler Inhale 2 puffs into the lungs every 6 (six) hours as needed for wheezing or shortness of breath. 3 each 1   aspirin 81 MG EC tablet Take 81 mg by mouth daily. Swallow whole.     Ferrous Sulfate (SLOW FE) 142 (45 Fe) MG TBCR One tablet per day 30 tablet 2   Fluticasone-Umeclidin-Vilant (TRELEGY ELLIPTA IN) Inhale into the lungs.  iron dextran complex in sodium chloride 0.9 % 500 mL Inject into the vein once.     metoprolol succinate (TOPROL-XL) 25 MG 24 hr tablet Take 1 tablet (25 mg total) by mouth 2 (two) times daily. 180 tablet 1   Multiple Vitamin (MULTIVITAMIN WITH MINERALS) TABS Take 1 tablet by mouth daily.     Omega-3 Fatty Acids (FISH OIL) 1000 MG CAPS Take 3,000 mg by mouth daily.     ondansetron (ZOFRAN-ODT) 4 MG disintegrating tablet Take 1 tablet (4 mg total) by mouth every 4 (four) hours as needed for nausea or vomiting. 30 tablet 0   pantoprazole (PROTONIX) 20 MG tablet TAKE 1 TABLET BY MOUTH TWICE A DAY 180 tablet 1   rosuvastatin (CRESTOR) 10 MG tablet Take 1 tablet (10 mg total) by mouth daily. 90 tablet 1   No current facility-administered medications for this visit.    Review of Systems  Constitutional:  Positive for fatigue. Negative for appetite change, chills and fever.  HENT:   Negative for hearing loss and voice change.   Eyes:  Negative for eye problems.  Respiratory:  Negative for chest tightness and cough.   Cardiovascular:  Negative for chest pain.  Gastrointestinal:  Positive for blood in stool.  Negative for abdominal distention and abdominal pain.  Endocrine: Negative for hot flashes.  Genitourinary:  Negative for difficulty urinating and frequency.   Musculoskeletal:  Negative for arthralgias.  Skin:  Negative for itching and rash.  Neurological:  Negative for extremity weakness.  Hematological:  Negative for adenopathy.  Psychiatric/Behavioral:  Negative for confusion.     PHYSICAL EXAMINATION: ECOG PERFORMANCE STATUS: 1 - Symptomatic but completely ambulatory Vitals:   02/07/22 1455  BP: 139/69  Pulse: 85  Resp: 18  SpO2: 99%   Filed Weights   02/07/22 1455  Weight: 203 lb (92.1 kg)    Physical Exam Constitutional:      General: She is not in acute distress. HENT:     Head: Normocephalic and atraumatic.  Eyes:     General: No scleral icterus. Cardiovascular:     Rate and Rhythm: Normal rate and regular rhythm.     Heart sounds: Normal heart sounds.  Pulmonary:     Effort: Pulmonary effort is normal. No respiratory distress.     Breath sounds: No wheezing.  Abdominal:     General: Bowel sounds are normal. There is no distension.     Palpations: Abdomen is soft.  Musculoskeletal:        General: No deformity. Normal range of motion.     Cervical back: Normal range of motion and neck supple.  Skin:    General: Skin is warm and dry.     Coloration: Skin is pale.     Findings: No erythema or rash.  Neurological:     Mental Status: She is alert and oriented to person, place, and time. Mental status is at baseline.     Cranial Nerves: No cranial nerve deficit.     Coordination: Coordination normal.  Psychiatric:        Mood and Affect: Mood normal.      LABORATORY DATA:  I have reviewed the data as listed    Latest Ref Rng & Units 02/06/2022    8:56 AM 12/05/2021    1:59 PM 11/29/2021    8:39 AM  CBC  WBC 4.0 - 10.5 K/uL 4.7  5.2  4.3   Hemoglobin 12.0 - 15.0 g/dL 13.1  7.6 Repeated and verified X2.  9.2   Hematocrit 36.0 - 46.0 % 41.6  24.0  29.5    Platelets 150 - 400 K/uL 241  314.0  291.0       Latest Ref Rng & Units 11/29/2021    8:39 AM 05/10/2021    9:11 AM 11/01/2020    8:17 AM  CMP  Glucose 70 - 99 mg/dL 89  116  119   BUN 6 - 23 mg/dL '19  16  12   '$ Creatinine 0.40 - 1.20 mg/dL 0.87  0.91  0.82   Sodium 135 - 145 mEq/L 140  141  142   Potassium 3.5 - 5.1 mEq/L 4.9  4.8  4.8   Chloride 96 - 112 mEq/L 104  103  104   CO2 19 - 32 mEq/L '30  30  30   '$ Calcium 8.4 - 10.5 mg/dL 9.0  9.5  9.1   Total Protein 6.0 - 8.3 g/dL 6.5  7.2  7.0   Total Bilirubin 0.2 - 1.2 mg/dL 0.2  0.5  0.6   Alkaline Phos 39 - 117 U/L 62  64  77   AST 0 - 37 U/L '21  22  23   '$ ALT 0 - 35 U/L '23  26  27       '$ Component Value Date/Time   IRON 104 02/06/2022 0856   TIBC 424 02/06/2022 0856   FERRITIN 102 02/06/2022 0856   IRONPCTSAT 25 02/06/2022 0856     RADIOGRAPHIC STUDIES: I have personally reviewed the radiological images as listed and agreed with the findings in the report. DG Sinuses Complete  Result Date: 01/09/2022 CLINICAL DATA:  Facial pain, fell in March 2023, concussion, atypical facial pain and sinus pressure EXAM: PARANASAL SINUSES - COMPLETE 3 + VIEW COMPARISON:  None FINDINGS: Osseous demineralization. Nasal septum midline. Paranasal sinuses and mastoid air cells clear. Hypoplastic frontal sinus. No facial bone or sinus abnormality seen. IMPRESSION: No sinus abnormalities identified. Electronically Signed   By: Lavonia Dana M.D.   On: 01/09/2022 19:43

## 2022-02-08 ENCOUNTER — Encounter: Payer: Self-pay | Admitting: Internal Medicine

## 2022-02-08 DIAGNOSIS — D649 Anemia, unspecified: Secondary | ICD-10-CM | POA: Insufficient documentation

## 2022-02-14 ENCOUNTER — Other Ambulatory Visit: Payer: Self-pay

## 2022-02-14 ENCOUNTER — Telehealth: Payer: Self-pay | Admitting: Internal Medicine

## 2022-02-14 MED ORDER — METOPROLOL SUCCINATE ER 25 MG PO TB24: 25.0000 mg | ORAL_TABLET | Freq: Two times a day (BID) | ORAL | 1 refills | Status: DC

## 2022-02-14 MED ORDER — PANTOPRAZOLE SODIUM 20 MG PO TBEC: 20.0000 mg | DELAYED_RELEASE_TABLET | Freq: Two times a day (BID) | ORAL | 1 refills | Status: DC

## 2022-02-14 NOTE — Telephone Encounter (Signed)
LM that prescriptions have been sent to [facility].

## 2022-02-14 NOTE — Telephone Encounter (Signed)
Pt need a refill on metoprolol succinate and Pantoprazole sent to [facility]

## 2022-02-25 ENCOUNTER — Other Ambulatory Visit: Payer: Self-pay | Admitting: Internal Medicine

## 2022-03-21 ENCOUNTER — Ambulatory Visit: Payer: Medicare Other | Admitting: Physician Assistant

## 2022-03-23 ENCOUNTER — Telehealth: Payer: Self-pay | Admitting: Physician Assistant

## 2022-03-23 NOTE — Telephone Encounter (Signed)
Patient called to cancel her PTNS appointments through the end of the year. She is having some leg issues (swelling, rashes) and her dermatologist has advised her not to proceed with PTNS at this time. She has her next dermatologist appt on 12/12, and will know more at that time. She asked if these treatments will need to be approved/authorized again. She also said that she recently had 6 weeks of iron infusions, and wants to know how long after finishing infusions does she need to wait for PTNS, or does that matter?

## 2022-03-23 NOTE — Telephone Encounter (Signed)
Iron infusions shouldn't have any bearing on her PTNS treatments. Not sure about re-approval. Crysta, can you please answer this for her?

## 2022-03-28 ENCOUNTER — Ambulatory Visit: Payer: Medicare Other | Admitting: Physician Assistant

## 2022-03-28 NOTE — Telephone Encounter (Signed)
Notified pt she would not need to get re-approved., she can be rescheduled for PTNS at any time. Pt expressed understanding and states she will call back to reschedule once she sees the dermatologist in December.

## 2022-04-04 ENCOUNTER — Ambulatory Visit: Payer: Medicare Other | Admitting: Physician Assistant

## 2022-04-11 ENCOUNTER — Ambulatory Visit: Payer: Medicare Other | Admitting: Physician Assistant

## 2022-04-18 ENCOUNTER — Ambulatory Visit: Payer: Medicare Other | Admitting: Physician Assistant

## 2022-04-25 ENCOUNTER — Ambulatory Visit: Payer: Medicare Other | Admitting: Physician Assistant

## 2022-05-02 ENCOUNTER — Ambulatory Visit: Payer: Medicare Other | Admitting: Physician Assistant

## 2022-05-09 ENCOUNTER — Ambulatory Visit: Payer: Medicare Other | Admitting: Physician Assistant

## 2022-05-10 ENCOUNTER — Ambulatory Visit: Payer: Medicare Other | Admitting: Internal Medicine

## 2022-05-11 ENCOUNTER — Ambulatory Visit
Admission: RE | Admit: 2022-05-11 | Discharge: 2022-05-11 | Disposition: A | Discharge status: Home / Self Care | Payer: Medicare Other | Source: Ambulatory Visit | Attending: Internal Medicine | Admitting: Internal Medicine

## 2022-05-11 DIAGNOSIS — Z1231 Encounter for screening mammogram for malignant neoplasm of breast: Secondary | ICD-10-CM | POA: Diagnosis present

## 2022-05-12 ENCOUNTER — Other Ambulatory Visit: Payer: Self-pay | Admitting: Internal Medicine

## 2022-05-12 DIAGNOSIS — R928 Other abnormal and inconclusive findings on diagnostic imaging of breast: Secondary | ICD-10-CM

## 2022-05-12 DIAGNOSIS — N6489 Other specified disorders of breast: Secondary | ICD-10-CM

## 2022-05-16 ENCOUNTER — Ambulatory Visit: Payer: Medicare Other | Admitting: Physician Assistant

## 2022-05-17 ENCOUNTER — Encounter: Payer: Self-pay | Admitting: *Deleted

## 2022-05-17 ENCOUNTER — Ambulatory Visit
Admission: RE | Admit: 2022-05-17 | Discharge: 2022-05-17 | Disposition: A | Discharge status: Home / Self Care | Payer: Medicare Other | Source: Ambulatory Visit | Attending: Internal Medicine | Admitting: Internal Medicine

## 2022-05-17 DIAGNOSIS — R928 Other abnormal and inconclusive findings on diagnostic imaging of breast: Secondary | ICD-10-CM | POA: Diagnosis present

## 2022-05-17 DIAGNOSIS — N6489 Other specified disorders of breast: Secondary | ICD-10-CM

## 2022-05-18 ENCOUNTER — Telehealth: Payer: Self-pay

## 2022-05-18 ENCOUNTER — Telehealth: Payer: Self-pay | Admitting: *Deleted

## 2022-05-18 NOTE — Telephone Encounter (Signed)
error 

## 2022-05-18 NOTE — Telephone Encounter (Signed)
$  0 due for Prolia, PA not required  Appt scheduled on 06/13/22

## 2022-05-23 ENCOUNTER — Ambulatory Visit: Payer: Medicare Other | Admitting: Physician Assistant

## 2022-05-30 ENCOUNTER — Encounter: Payer: Self-pay | Admitting: Internal Medicine

## 2022-05-30 ENCOUNTER — Ambulatory Visit: Payer: Medicare Other | Admitting: Physician Assistant

## 2022-05-30 ENCOUNTER — Ambulatory Visit (INDEPENDENT_AMBULATORY_CARE_PROVIDER_SITE_OTHER): Payer: Medicare Other | Admitting: Internal Medicine

## 2022-05-30 VITALS — BP 118/68 | HR 73 | Temp 98.2°F | Resp 15 | Ht 64.0 in | Wt 204.6 lb

## 2022-05-30 DIAGNOSIS — Z86010 Personal history of colonic polyps: Secondary | ICD-10-CM

## 2022-05-30 DIAGNOSIS — N3946 Mixed incontinence: Secondary | ICD-10-CM

## 2022-05-30 DIAGNOSIS — E1165 Type 2 diabetes mellitus with hyperglycemia: Secondary | ICD-10-CM

## 2022-05-30 DIAGNOSIS — I10 Essential (primary) hypertension: Secondary | ICD-10-CM | POA: Diagnosis not present

## 2022-05-30 DIAGNOSIS — R42 Dizziness and giddiness: Secondary | ICD-10-CM

## 2022-05-30 DIAGNOSIS — R739 Hyperglycemia, unspecified: Secondary | ICD-10-CM

## 2022-05-30 DIAGNOSIS — D509 Iron deficiency anemia, unspecified: Secondary | ICD-10-CM | POA: Diagnosis not present

## 2022-05-30 DIAGNOSIS — K219 Gastro-esophageal reflux disease without esophagitis: Secondary | ICD-10-CM

## 2022-05-30 DIAGNOSIS — M810 Age-related osteoporosis without current pathological fracture: Secondary | ICD-10-CM

## 2022-05-30 DIAGNOSIS — E7800 Pure hypercholesterolemia, unspecified: Secondary | ICD-10-CM | POA: Diagnosis not present

## 2022-05-30 DIAGNOSIS — Z0000 Encounter for general adult medical examination without abnormal findings: Secondary | ICD-10-CM

## 2022-05-30 DIAGNOSIS — R519 Headache, unspecified: Secondary | ICD-10-CM

## 2022-05-30 DIAGNOSIS — D329 Benign neoplasm of meninges, unspecified: Secondary | ICD-10-CM

## 2022-05-30 LAB — HEPATIC FUNCTION PANEL
ALT: 20 U/L (ref 0–35)
AST: 19 U/L (ref 0–37)
Albumin: 4.2 g/dL (ref 3.5–5.2)
Alkaline Phosphatase: 60 U/L (ref 39–117)
Bilirubin, Direct: 0.1 mg/dL (ref 0.0–0.3)
Total Bilirubin: 0.4 mg/dL (ref 0.2–1.2)
Total Protein: 6.8 g/dL (ref 6.0–8.3)

## 2022-05-30 LAB — BASIC METABOLIC PANEL
BUN: 12 mg/dL (ref 6–23)
CO2: 29 mEq/L (ref 19–32)
Calcium: 8.7 mg/dL (ref 8.4–10.5)
Chloride: 103 mEq/L (ref 96–112)
Creatinine, Ser: 0.85 mg/dL (ref 0.40–1.20)
GFR: 64.7 mL/min (ref >60.00)
Glucose, Bld: 105 mg/dL — ABNORMAL HIGH (ref 70–99)
Potassium: 4.1 mEq/L (ref 3.5–5.1)
Sodium: 140 mEq/L (ref 135–145)

## 2022-05-30 LAB — LIPID PANEL
Cholesterol: 151 mg/dL (ref 0–200)
HDL: 75.1 mg/dL (ref >39.00)
LDL Cholesterol: 48 mg/dL (ref 0–99)
NonHDL: 75.67
Total CHOL/HDL Ratio: 2
Triglycerides: 137 mg/dL (ref 0.0–149.0)
VLDL: 27.4 mg/dL (ref 0.0–40.0)

## 2022-05-30 LAB — MICROALBUMIN / CREATININE URINE RATIO
Creatinine,U: 125.9 mg/dL
Microalb Creat Ratio: 0.6 mg/g (ref 0.0–30.0)
Microalb, Ur: <0.7 mg/dL (ref 0.0–1.9)

## 2022-05-30 LAB — HEMOGLOBIN A1C: Hgb A1c MFr Bld: 6.2 % (ref 4.6–6.5)

## 2022-05-30 MED ORDER — ROSUVASTATIN CALCIUM 10 MG PO TABS: 10.0000 mg | ORAL_TABLET | Freq: Every day | ORAL | 1 refills | Status: DC

## 2022-05-30 MED ORDER — PANTOPRAZOLE SODIUM 20 MG PO TBEC: 20.0000 mg | DELAYED_RELEASE_TABLET | Freq: Two times a day (BID) | ORAL | 1 refills | Status: DC

## 2022-05-30 MED ORDER — METOPROLOL SUCCINATE ER 25 MG PO TB24: 25.0000 mg | ORAL_TABLET | Freq: Two times a day (BID) | ORAL | 1 refills | Status: DC

## 2022-05-30 NOTE — Progress Notes (Signed)
Subjective:    [name], female    DOB: Jan 02, 1941, 82 y.o.   MRN: [id number]  Patient here for  Chief Complaint  Patient presents with   Annual Exam    yearly    HPI With past history of hypercholesterolemia and hypertension.  She comes in today to follow up on these issues as well as for a complete physical exam. Was having persistent headaches.  Previous fall.  Full details in 11/29/21 note. Had MRI - slight increased in size of meningioma compared to 2016. No acute intracranial process.  She was instructed to avoid increased stimulation, etc.  She does report the dizziness and headache - better.  Still some issues, but has improved. Notices when lying down.  Minimal when stands.  Has neck issues.  Reports saw ENT.  Had what sounds like epley maneuvers.  Need records.  Discussed doing modified epley maneuvers at home.  Seeing GI - IDA.  Hgb decreased to 7.6 recently. Has seen hematology as well.  S/p IV iron infusion.  Recommended EGD and colonoscopy.  Colonoscopy 01/02/22 - one 2 mm polyp in transverse colon. EGD 01/02/22 - hiatal hernia and reflux esophagitis. No acid reflux reported.  No abdominal pain or bowels change reported.  No chest pain. Breathing stable.  No increased cough or congestion.  Seeing urology - mixed incontinence - recommended percutaneous tibial nerve stimulation.  Receiving prolia.  Has appt to see dermatology 06/12/22 - f/u left arm.   Past Medical History:  Diagnosis Date   Asthma    Fibrocystic breast disease    Gastritis    EGD - schatzki's ring, gastritis and gastric polyps   History of colon polyps    Hypercholesterolemia    Hypertension    MAI (mycobacterium avium-intracellulare) infection ([place])    Osteoporosis    Reflux esophagitis    Past Surgical History:  Procedure Laterality Date   ABDOMINAL HYSTERECTOMY  1998   bladder tact     BREAST EXCISIONAL BIOPSY Bilateral 1970's   benign   BREAST EXCISIONAL BIOPSY Bilateral 2010   pt   stated dr Tamala Julian did bilat axilla lumps neg    BREAST SURGERY     cyst(benign)   CATARACT EXTRACTION W/PHACO Left 06/13/2021   Procedure: CATARACT EXTRACTION PHACO AND INTRAOCULAR LENS PLACEMENT ([place]) LEFT;  Surgeon: Eulogio Bear, MD;  Location: [place];  Service: Ophthalmology;  Laterality: Left;  7.04 0:40.5   CATARACT EXTRACTION W/PHACO Right 06/27/2021   Procedure: CATARACT EXTRACTION PHACO AND INTRAOCULAR LENS PLACEMENT ([facility]) RIGHT;  Surgeon: Eulogio Bear, MD;  Location: [facility];  Service: Ophthalmology;  Laterality: Right;  3.32 0:30.7   COLONOSCOPY N/A 09/28/2014   Procedure: COLONOSCOPY;  Surgeon: Manya Silvas, MD;  Location[facility] END[facility]COPY;  Service: Endoscopy;  Laterality: N/A;   COLONOSCOPY WITH PROPOFOL N/A 01/02/2022   Procedure: COLONOSCOPY WITH PROPOFOL;  Surgeon: Lesly Rubenstein, MD;  Location: [facility] ENDOSCOPY;  Service: Endoscopy;  Laterality: N/A;   ESOPHAGOGASTRODUODENOSCOPY N/A 09/28/2014   Procedure: ESOPHAGOGASTRODUODENOSCOPY (EGD);  Surgeon: Manya Silvas, MD;  Location: [facility] ENDOSCOPY;  Service: Endoscopy;  Laterality: N/A;   ESOPHAGOGASTRODUODENOSCOPY (EGD) WITH PROPOFOL N/A 04/26/2016   Procedure: ESOPHAGOGASTRODUODENOSCOPY (EGD) WITH PROPOFOL;  Surgeon: Manya Silvas, MD;  Location: [facility] ENDOSCOPY;  Service: Endoscopy;  Laterality: N/A;   ESOPHAGOGASTRODUODENOSCOPY (EGD) WITH PROPOFOL N/A 01/02/2022   Procedure: ESOPHAGOGASTRODUODENOSCOPY (EGD) WITH PROPOFOL;  Surgeon: Lesly Rubenstein, MD;  Location: [facility] ENDOSCOPY;  Service: Endoscopy;  Laterality: N/A;  excision of right axillary lipoma  02/2006   EYE SURGERY     FOOT SURGERY Right    HEMORRHOID SURGERY     HERNIA REPAIR  02/2006   NASAL SEPTUM SURGERY     Family History  Problem Relation Age of Onset   Heart disease Mother 40       heart attack   Cancer Father        lung   Diabetes Sister    Breast cancer Sister 79   Breast cancer Daughter 5   Stroke  Brother    Diabetes Brother    Social History   Socioeconomic History   Marital status: Widowed    Spouse name: Not on file   Number of children: Not on file   Years of education: Not on file   Highest education level: Not on file  Occupational History   Not on file  Tobacco Use   Smoking status: Never    Passive exposure: Never   Smokeless tobacco: Never   Tobacco comments:    Never  Vaping Use   Vaping Use: Never used  Substance and Sexual Activity   Alcohol use: No    Alcohol/week: 0.0 standard drinks of alcohol   Drug use: No   Sexual activity: Not on file  Other Topics Concern   Not on file  Social History Narrative   Not on file   Social Determinants of Health   Financial Resource Strain: Not on file  Food Insecurity: Not on file  Transportation Needs: Not on file  Physical Activity: Not on file  Stress: Not on file  Social Connections: Not on file     Review of Systems  Constitutional:  Negative for appetite change and unexpected weight change.  HENT:  Negative for congestion and sinus pressure.   Respiratory:  Negative for cough, chest tightness and shortness of breath.   Cardiovascular:  Negative for chest pain, palpitations and leg swelling.  Gastrointestinal:  Negative for abdominal pain, diarrhea, nausea and vomiting.  Genitourinary:  Negative for difficulty urinating and dysuria.  Musculoskeletal:  Negative for joint swelling and myalgias.  Skin:  Negative for color change and rash.  Neurological:        Mild dizziness as outlined. No increased headache.   Psychiatric/Behavioral:  Negative for agitation and dysphoric mood.        Objective:     BP 118/68   Pulse 73   Temp 98.2 F (36.8 C) (Oral)   Resp 15   Ht '5\' 4"'$  (1.626 m)   Wt 204 lb 9.6 oz (92.8 kg)   SpO2 95%   BMI 35.12 kg/m  Wt Readings from Last 3 Encounters:  05/30/22 204 lb 9.6 oz (92.8 kg)  02/07/22 203 lb (92.1 kg)  01/09/22 190 lb (86.2 kg)    Physical Exam Vitals  reviewed.  Constitutional:      General: She is not in acute distress.    Appearance: Normal appearance. She is well-developed.  HENT:     Head: Normocephalic and atraumatic.     Right Ear: External ear normal.     Left Ear: External ear normal.  Eyes:     General: No scleral icterus.       Right eye: No discharge.        Left eye: No discharge.     Conjunctiva/sclera: Conjunctivae normal.  Neck:     Thyroid: No thyromegaly.  Cardiovascular:     Rate and Rhythm: Normal rate and regular  rhythm.  Pulmonary:     Effort: No tachypnea, accessory muscle usage or respiratory distress.     Breath sounds: Normal breath sounds. No decreased breath sounds or wheezing.  Chest:  Breasts:    Right: No inverted nipple, mass, nipple discharge or tenderness (no axillary adenopathy).     Left: No inverted nipple, mass, nipple discharge or tenderness (no axilarry adenopathy).  Abdominal:     General: Bowel sounds are normal.     Palpations: Abdomen is soft.     Tenderness: There is no abdominal tenderness.  Musculoskeletal:        General: No swelling or tenderness.     Cervical back: Neck supple.  Lymphadenopathy:     Cervical: No cervical adenopathy.  Skin:    Findings: No erythema or rash.  Neurological:     Mental Status: She is alert and oriented to person, place, and time.  Psychiatric:        Mood and Affect: Mood normal.        Behavior: Behavior normal.      Outpatient Encounter Medications as of 05/30/2022  Medication Sig   albuterol (VENTOLIN HFA) 108 (90 Base) MCG/ACT inhaler Inhale 2 puffs into the lungs every 6 (six) hours as needed for wheezing or shortness of breath.   aspirin 81 MG EC tablet Take 81 mg by mouth daily. Swallow whole.   [employer] SLOW RELEASE IRON 143 (45 Fe) MG TBCR 1 TABLET DAILY   Fluticasone-Umeclidin-Vilant (TRELEGY ELLIPTA IN) Inhale into the lungs.   iron dextran complex in sodium chloride 0.9 % 500 mL Inject into the vein once.   Multiple Vitamin  (MULTIVITAMIN WITH MINERALS) TABS Take 1 tablet by mouth daily.   Omega-3 Fatty Acids (FISH OIL) 1000 MG CAPS Take 3,000 mg by mouth daily.   ondansetron (ZOFRAN-ODT) 4 MG disintegrating tablet Take 1 tablet (4 mg total) by mouth every 4 (four) hours as needed for nausea or vomiting.   [DISCONTINUED] metoprolol succinate (TOPROL-XL) 25 MG 24 hr tablet Take 1 tablet (25 mg total) by mouth 2 (two) times daily.   [DISCONTINUED] montelukast (SINGULAIR) 10 MG tablet Take 1 tablet by mouth at bedtime.   [DISCONTINUED] pantoprazole (PROTONIX) 20 MG tablet Take 1 tablet (20 mg total) by mouth 2 (two) times daily.   [DISCONTINUED] rosuvastatin (CRESTOR) 10 MG tablet Take 1 tablet (10 mg total) by mouth daily.   metoprolol succinate (TOPROL-XL) 25 MG 24 hr tablet Take 1 tablet (25 mg total) by mouth 2 (two) times daily.   pantoprazole (PROTONIX) 20 MG tablet Take 1 tablet (20 mg total) by mouth 2 (two) times daily.   rosuvastatin (CRESTOR) 10 MG tablet Take 1 tablet (10 mg total) by mouth daily.   No facility-administered encounter medications on file as of 05/30/2022.     Lab Results  Component Value Date   WBC 4.7 02/06/2022   HGB 13.1 02/06/2022   HCT 41.6 02/06/2022   PLT 241 02/06/2022   GLUCOSE 105 (H) 05/30/2022   CHOL 151 05/30/2022   TRIG 137.0 05/30/2022   HDL 75.10 05/30/2022   LDLCALC 48 05/30/2022   ALT 20 05/30/2022   AST 19 05/30/2022   NA 140 05/30/2022   K 4.1 05/30/2022   CL 103 05/30/2022   CREATININE 0.85 05/30/2022   BUN 12 05/30/2022   CO2 29 05/30/2022   TSH 2.87 05/10/2021   HGBA1C 6.2 05/30/2022   MICROALBUR <0.7 05/30/2022    MM DIAG BREAST TOMO UNI LEFT  Result Date:  05/17/2022 CLINICAL DATA:  82 year old female recalled from screening mammogram dated 05/03/2022 for a possible left breast asymmetry. EXAM: DIGITAL DIAGNOSTIC UNILATERAL LEFT MAMMOGRAM WITH TOMOSYNTHESIS; ULTRASOUND LEFT BREAST LIMITED TECHNIQUE: Left digital diagnostic mammography and breast  tomosynthesis was performed.; Targeted ultrasound examination of the left breast was performed. COMPARISON:  Previous exam(s). ACR Breast Density Category b: There are scattered areas of fibroglandular density. FINDINGS: Previously described, possible asymmetry in the lateral left breast at posterior depth on the cc projection only effaces into fibroglandular tissue on today's additional views. Precautionary ultrasound was performed. Targeted ultrasound is performed, showing no focal or suspicious sonographic abnormality in the lateral left breast. IMPRESSION: No mammographic or sonographic evidence of malignancy. RECOMMENDATION: Screening mammogram in one year.(Code:SM-B-01Y) I have discussed the findings and recommendations with the patient. If applicable, a reminder letter will be sent to the patient regarding the next appointment. BI-RADS CATEGORY  1: Negative. Electronically Signed   By: Kristopher Oppenheim M.D.   On: 05/17/2022 15:57   US BREAST LTD UNI LEFT INC AXILLA  Result Date: 05/17/2022 CLINICAL DATA:  82 year old female recalled from screening mammogram dated 05/03/2022 for a possible left breast asymmetry. EXAM: DIGITAL DIAGNOSTIC UNILATERAL LEFT MAMMOGRAM WITH TOMOSYNTHESIS; ULTRASOUND LEFT BREAST LIMITED TECHNIQUE: Left digital diagnostic mammography and breast tomosynthesis was performed.; Targeted ultrasound examination of the left breast was performed. COMPARISON:  Previous exam(s). ACR Breast Density Category b: There are scattered areas of fibroglandular density. FINDINGS: Previously described, possible asymmetry in the lateral left breast at posterior depth on the cc projection only effaces into fibroglandular tissue on today's additional views. Precautionary ultrasound was performed. Targeted ultrasound is performed, showing no focal or suspicious sonographic abnormality in the lateral left breast. IMPRESSION: No mammographic or sonographic evidence of malignancy. RECOMMENDATION: Screening  mammogram in one year.(Code:SM-B-01Y) I have discussed the findings and recommendations with the patient. If applicable, a reminder letter will be sent to the patient regarding the next appointment. BI-RADS CATEGORY  1: Negative. Electronically Signed   By: Kristopher Oppenheim M.D.   On: 05/17/2022 15:57      Assessment & Plan:  Essential hypertension, benign Assessment & Plan: Blood pressure as outlined.  On metoprolol.  Follow pressures. Follow metabolic panel.   Orders: -     Basic metabolic panel  Type 2 diabetes mellitus with hyperglycemia, without long-term current use of insulin (HCC) Assessment & Plan: Low carb diet and exercise.  Follow met b and a1c.  Just had eyes checked.   Orders: -     Microalbumin / creatinine urine ratio -     Hemoglobin A1c  Hypercholesterolemia Assessment & Plan: Continue Crestor.  Low-cholesterol diet and exercise.  Follow-up fast lipid profile liver panel.  Orders: -     Lipid panel -     Hepatic function panel  Iron deficiency anemia, unspecified iron deficiency anemia type Assessment & Plan: Seeing hematology (Dr Tasia Catchings) - s/p IV venofer. 01/02/22 upper endoscopy showed LA Grade B reflux esophagitis with no bleeding, - 5 cm hiatal hernia with a few Cameron ulcers.- Erythematous mucosa in the antrum. Biopsy showed erosive gastritis. Colonoscopy 78m polyp.  Last hgb 13.1.     Dizziness Assessment & Plan: Previous fall.  Full details in 11/29/21 note. Had MRI - slight increased in size of meningioma compared to 2016. No acute intracranial process.  She was instructed to avoid increased stimulation, etc.  She does report the dizziness and headache - better.  Still some issues, but has improved. Notices when lying down.  Minimal when stands.  Has neck issues.  Reports saw ENT.  Had what sounds like epley maneuvers.  Need records.  Discussed doing modified epley maneuvers at home. Given above and MRI, follow up with neurology.   Orders: -     Ambulatory  referral to Neurology  Gastroesophageal reflux disease, unspecified whether esophagitis present Assessment & Plan: No acid reflux reported.  Continue protonix.    Nonintractable headache, unspecified chronicity pattern, unspecified headache type Assessment & Plan: Headache has improved.  Recent fall as outlined.  Some persistent dizziness as described.  MRI as outlined.  F/u with neurology.    Orders: -     Ambulatory referral to Neurology  Health care maintenance Assessment & Plan: Physical today 05/30/22.  Mammogram 05/11/22 - Birads 0.  F/u left breast mammogram - Birads I.  Recommended continuing yearly mammogram.  Colonoscopy 01/02/22.  Receiving prolia.     History of colonic polyps Assessment & Plan: Colonoscopy 01/02/22 - COLON POLYP, TRANSVERSE; COLD BIOPSY:  - TUBULAR ADENOMA.  - NEGATIVE FOR HIGH-GRADE DYSPLASIA AND MALIGNANCY.    Hyperglycemia Assessment & Plan: Low-carb diet and exercise.  Follow metabolic panel and O1Y.   Meningioma [facility]) Assessment & Plan: Recent MRI - slight increased in size of meningioma compared to 2016. No acute intracranial process. Needs f/u with neurology.  Discussed.    Orders: -     Ambulatory referral to Neurology  Mixed incontinence Assessment & Plan: Dr Matilde Sprang 01/09/22 - Start percutaneous tibial nerve stimulation.  Can always consider InterStim in the future. PTNS a good option based on age and comorbidities   Osteoporosis without current pathological fracture, unspecified osteoporosis type Assessment & Plan: Continue prolia.    Other orders -     Metoprolol Succinate ER; Take 1 tablet (25 mg total) by mouth 2 (two) times daily.  Dispense: 180 tablet; Refill: 1 -     Rosuvastatin Calcium; Take 1 tablet (10 mg total) by mouth daily.  Dispense: 90 tablet; Refill: 1 -     Pantoprazole Sodium; Take 1 tablet (20 mg total) by mouth 2 (two) times daily.  Dispense: 180 tablet; Refill: 1     Einar Pheasant, MD

## 2022-05-31 ENCOUNTER — Telehealth: Payer: Self-pay | Admitting: Internal Medicine

## 2022-05-31 NOTE — Telephone Encounter (Signed)
Pt need a refill on [facility]ukast [facility]

## 2022-06-01 ENCOUNTER — Other Ambulatory Visit: Payer: Self-pay

## 2022-06-01 MED ORDER — MONTELUKAST SODIUM 10 MG PO TABS: 10.0000 mg | ORAL_TABLET | Freq: Every day | ORAL | 2 refills | Status: DC

## 2022-06-01 NOTE — Telephone Encounter (Signed)
sent 

## 2022-06-04 ENCOUNTER — Encounter: Payer: Self-pay | Admitting: Internal Medicine

## 2022-06-04 NOTE — Assessment & Plan Note (Signed)
Previous fall.  Full details in 11/29/21 note. Had MRI - slight increased in size of meningioma compared to 2016. No acute intracranial process.  She was instructed to avoid increased stimulation, etc.  She does report the dizziness and headache - better.  Still some issues, but has improved. Notices when lying down.  Minimal when stands.  Has neck issues.  Reports saw ENT.  Had what sounds like epley maneuvers.  Need records.  Discussed doing modified epley maneuvers at home. Given above and MRI, follow up with neurology.

## 2022-06-04 NOTE — Assessment & Plan Note (Signed)
Dr Matilde Sprang 01/09/22 - Start percutaneous tibial nerve stimulation.  Can always consider InterStim in the future. PTNS a good option based on age and comorbidities

## 2022-06-04 NOTE — Assessment & Plan Note (Signed)
No acid reflux reported.  Continue protonix.

## 2022-06-04 NOTE — Assessment & Plan Note (Signed)
Seeing hematology (Dr Tasia Catchings) - s/p IV venofer. 01/02/22 upper endoscopy showed LA Grade B reflux esophagitis with no bleeding, - 5 cm hiatal hernia with a few Cameron ulcers.- Erythematous mucosa in the antrum. Biopsy showed erosive gastritis. Colonoscopy 47m polyp.  Last hgb 13.1.

## 2022-06-04 NOTE — Assessment & Plan Note (Signed)
Blood pressure as outlined.  On metoprolol.  Follow pressures. Follow metabolic panel.

## 2022-06-04 NOTE — Assessment & Plan Note (Signed)
Colonoscopy 01/02/22 - COLON POLYP, TRANSVERSE; COLD BIOPSY:  - TUBULAR ADENOMA.  - NEGATIVE FOR HIGH-GRADE DYSPLASIA AND MALIGNANCY.

## 2022-06-04 NOTE — Assessment & Plan Note (Signed)
Low-carb diet and exercise.  Follow metabolic panel and K8H.

## 2022-06-04 NOTE — Assessment & Plan Note (Signed)
Recent MRI - slight increased in size of meningioma compared to 2016. No acute intracranial process. Needs f/u with neurology.  Discussed.

## 2022-06-04 NOTE — Assessment & Plan Note (Signed)
Low carb diet and exercise.  Follow met b and a1c.  Just had eyes checked.

## 2022-06-04 NOTE — Assessment & Plan Note (Signed)
Continue Crestor.  Low-cholesterol diet and exercise.  Follow-up fast lipid profile liver panel.

## 2022-06-04 NOTE — Assessment & Plan Note (Signed)
Headache has improved.  Recent fall as outlined.  Some persistent dizziness as described.  MRI as outlined.  F/u with neurology.

## 2022-06-04 NOTE — Assessment & Plan Note (Signed)
Physical today 05/30/22.  Mammogram 05/11/22 - Birads 0.  F/u left breast mammogram - Birads I.  Recommended continuing yearly mammogram.  Colonoscopy 01/02/22.  Receiving prolia.

## 2022-06-04 NOTE — Assessment & Plan Note (Signed)
Continue prolia.  

## 2022-06-06 ENCOUNTER — Ambulatory Visit: Payer: Medicare Other | Admitting: Physician Assistant

## 2022-06-12 ENCOUNTER — Ambulatory Visit: Payer: Medicare Other

## 2022-06-12 ENCOUNTER — Ambulatory Visit: Payer: Medicare Other | Admitting: Pulmonary Disease

## 2022-06-13 ENCOUNTER — Encounter: Payer: Self-pay | Admitting: Internal Medicine

## 2022-06-13 ENCOUNTER — Ambulatory Visit (INDEPENDENT_AMBULATORY_CARE_PROVIDER_SITE_OTHER): Payer: Medicare Other

## 2022-06-13 DIAGNOSIS — M810 Age-related osteoporosis without current pathological fracture: Secondary | ICD-10-CM | POA: Diagnosis not present

## 2022-06-13 DIAGNOSIS — C44621 Squamous cell carcinoma of skin of unspecified upper limb, including shoulder: Secondary | ICD-10-CM | POA: Insufficient documentation

## 2022-06-13 MED ORDER — DENOSUMAB 60 MG/ML ~~LOC~~ SOSY
60.0000 mg | PREFILLED_SYRINGE | Freq: Once | SUBCUTANEOUS | Status: AC
  Administered 2022-06-13: 60 mg via SUBCUTANEOUS

## 2022-06-13 NOTE — Progress Notes (Signed)
Angela Cohen presents today for injection per MD orders. Prolia injection administered SQ in right Upper Arm. Administration without incident. Patient tolerated well. [name],cman

## 2022-07-03 ENCOUNTER — Telehealth: Payer: Self-pay | Admitting: Oncology

## 2022-07-03 NOTE — Telephone Encounter (Signed)
spoke with pt to notify of chnage of appt date due to provider being out, appt has been r/s

## 2022-08-08 ENCOUNTER — Inpatient Hospital Stay: Payer: Medicare Other | Attending: Oncology

## 2022-08-08 DIAGNOSIS — D508 Other iron deficiency anemias: Secondary | ICD-10-CM

## 2022-08-08 DIAGNOSIS — D509 Iron deficiency anemia, unspecified: Secondary | ICD-10-CM | POA: Insufficient documentation

## 2022-08-08 LAB — CBC WITH DIFFERENTIAL/PLATELET
Abs Immature Granulocytes: 0.01 10*3/uL (ref 0.00–0.07)
Basophils Absolute: 0.1 10*3/uL (ref 0.0–0.1)
Basophils Relative: 1 %
Eosinophils Absolute: 0.3 10*3/uL (ref 0.0–0.5)
Eosinophils Relative: 6 %
HCT: 40.4 % (ref 36.0–46.0)
Hemoglobin: 12.9 g/dL (ref 12.0–15.0)
Immature Granulocytes: 0 %
Lymphocytes Relative: 23 %
Lymphs Abs: 1.2 10*3/uL (ref 0.7–4.0)
MCH: 30 pg (ref 26.0–34.0)
MCHC: 31.9 g/dL (ref 30.0–36.0)
MCV: 94 fL (ref 80.0–100.0)
Monocytes Absolute: 0.5 10*3/uL (ref 0.1–1.0)
Monocytes Relative: 10 %
Neutro Abs: 3.1 10*3/uL (ref 1.7–7.7)
Neutrophils Relative %: 60 %
Platelets: 251 10*3/uL (ref 150–400)
RBC: 4.3 MIL/uL (ref 3.87–5.11)
RDW: 14.3 % (ref 11.5–15.5)
WBC: 5.1 10*3/uL (ref 4.0–10.5)
nRBC: 0 % (ref 0.0–0.2)

## 2022-08-08 LAB — FERRITIN: Ferritin: 19 ng/mL (ref 11–307)

## 2022-08-08 LAB — IRON AND TIBC
Iron: 87 ug/dL (ref 28–170)
Saturation Ratios: 19 % (ref 10.4–31.8)
TIBC: 463 ug/dL — ABNORMAL HIGH (ref 250–450)
UIBC: 376 ug/dL

## 2022-08-14 ENCOUNTER — Ambulatory Visit: Payer: Medicare Other

## 2022-08-14 ENCOUNTER — Ambulatory Visit: Payer: Medicare Other | Admitting: Oncology

## 2022-08-15 ENCOUNTER — Inpatient Hospital Stay: Payer: Medicare Other | Attending: Oncology | Admitting: Oncology

## 2022-08-15 ENCOUNTER — Inpatient Hospital Stay: Payer: Medicare Other

## 2022-08-15 ENCOUNTER — Encounter: Payer: Self-pay | Admitting: Oncology

## 2022-08-15 VITALS — BP 154/82 | HR 97 | Temp 96.8°F | Resp 18 | Wt 205.2 lb

## 2022-08-15 DIAGNOSIS — Z79899 Other long term (current) drug therapy: Secondary | ICD-10-CM | POA: Insufficient documentation

## 2022-08-15 DIAGNOSIS — D509 Iron deficiency anemia, unspecified: Secondary | ICD-10-CM | POA: Diagnosis present

## 2022-08-15 DIAGNOSIS — Z7982 Long term (current) use of aspirin: Secondary | ICD-10-CM | POA: Diagnosis not present

## 2022-08-15 DIAGNOSIS — Z803 Family history of malignant neoplasm of breast: Secondary | ICD-10-CM | POA: Insufficient documentation

## 2022-08-15 DIAGNOSIS — D508 Other iron deficiency anemias: Secondary | ICD-10-CM | POA: Diagnosis not present

## 2022-08-15 DIAGNOSIS — K2970 Gastritis, unspecified, without bleeding: Secondary | ICD-10-CM | POA: Insufficient documentation

## 2022-08-15 DIAGNOSIS — Z801 Family history of malignant neoplasm of trachea, bronchus and lung: Secondary | ICD-10-CM | POA: Diagnosis not present

## 2022-08-15 NOTE — Assessment & Plan Note (Signed)
Reviewed labs with patient.   Hemoglobin has improved. No need for additional IV venofer. Continue oral ferrous sulfate once daily.

## 2022-08-15 NOTE — Progress Notes (Signed)
Hematology/Oncology Consult note [phone] [phone]      Patient Care Team: Einar Pheasant, MD as PCP - General (Internal Medicine) Kate Sable, MD as PCP - Cardiology (Cardiology)   REFERRING PROVIDER: Einar Pheasant, MD  CHIEF COMPLAINTS/REASON FOR VISIT:  Anemia  ASSESSMENT & PLAN:  IDA (iron deficiency anemia) Reviewed labs with patient.   Hemoglobin has improved. No need for additional IV venofer. Continue oral ferrous sulfate once daily.    Gastritis Cotinue PPI Advised to monitor for potential chronic blood loss and to report any changes in stool color  Orders Placed This Encounter  Procedures   CBC with Differential ([place] Only)    Standing Status:   Future    Standing Expiration Date:   08/15/2023   Iron and TIBC    Standing Status:   Future    Standing Expiration Date:   08/15/2023   Ferritin    Standing Status:   Future    Standing Expiration Date:   08/15/2023   Iron and TIBC    Standing Status:   Future    Standing Expiration Date:   08/15/2023   Follow up in 6 months.  All questions were answered. The patient knows to call the clinic with any problems, questions or concerns.  Earlie Server, MD, PhD [facility] Hematology Oncology 08/15/2022     HISTORY OF PRESENTING ILLNESS:  Angela Cohen is a  82 y.o.  female with PMH listed below who was referred to me for anemia Reviewed patient's recent labs that was done.  She was found to have abnormal CBC on 12/05/2021, hemoglobin 7.6, this dropped from 9.2  2 weeks prior.  12/05/2021, saturation 2.5, ferritin 12.5, TIBC 595 Reviewed patient's previous labs ordered by primary care physician's office,  Anemia is new onset.  Patient has a normal hemoglobin of 13  Patient reports feeling very tired, dizziness.  Shortness of breath with exertion. She denies recent chest pain on exertion,  pre-syncopal episodes, or palpitations She had not noticed any recent bleeding such as  epistaxis, hematuria.  Occasionally she sees bright red blood in stool.  She has noticed darker stool for brief period of time in the past, now normal. Her primary care provider talk to them and have advised patient to start Slow Fe with vitamin C supplementation while waiting for an appointment to establish with hematology.  Repeat CBC on 12/08/2021, hemoglobin was 7.7. Her last colonoscopy was 09/28/2014 -internal hemorrhoids 04/26/2016, EGD showed gastritis.  01/02/22 upper endoscopy showed LA Grade B reflux esophagitis with no bleeding, - 5 cm hiatal hernia with a few Cameron ulcers.- Erythematous mucosa in the antrum. Biopsy showed erosive gastritis Colonoscopy 89mm polyp   INTERVAL HISTORY Angela Cohen is a 82 y.o. female who has above history reviewed by me today presents for follow up visit for iron deficiency anemia. S/p IV Venofer treatments, tolerated well.  Fatigue has improved. No blood in stool.  She takes PPI.   MEDICAL HISTORY:  Past Medical History:  Diagnosis Date   Asthma    Fibrocystic breast disease    Gastritis    EGD - schatzki's ring, gastritis and gastric polyps   History of colon polyps    Hypercholesterolemia    Hypertension    MAI (mycobacterium avium-intracellulare) infection    Osteoporosis    Reflux esophagitis     SURGICAL HISTORY: Past Surgical History:  Procedure Laterality Date   ABDOMINAL HYSTERECTOMY  1998   bladder tact  BREAST EXCISIONAL BIOPSY Bilateral 1970's   benign   BREAST EXCISIONAL BIOPSY Bilateral 2010   pt  stated dr Tamala Julian did bilat axilla lumps neg    BREAST SURGERY     cyst(benign)   CATARACT EXTRACTION W/PHACO Left 06/13/2021   Procedure: CATARACT EXTRACTION PHACO AND INTRAOCULAR LENS PLACEMENT ([place]) LEFT;  Surgeon: Eulogio Bear, MD;  Location: [place];  Service: Ophthalmology;  Laterality: Left;  7.04 0:40.5   CATARACT EXTRACTION W/PHACO Right 06/27/2021   Procedure: CATARACT EXTRACTION PHACO AND  INTRAOCULAR LENS PLACEMENT (IOC) RIGHT;  Surgeon: Eulogio Bear, MD;  Location: [place];  Service: Ophthalmology;  Laterality: Right;  3.32 0:30.7   COLONOSCOPY N/A 09/28/2014   Procedure: COLONOSCOPY;  Surgeon: Manya Silvas, MD;  Location: [facility] ENDOSCOPY;  Service: Endoscopy;  Laterality: N/A;   COLONOSCOPY WITH PROPOFOL N/A 01/02/2022   Procedure: COLONOSCOPY WITH PROPOFOL;  Surgeon: Lesly Rubenstein, MD;  Location: [facility] ENDOSCOPY;  Service: Endoscopy;  Laterality: N/A;   ESOPHAGOGASTRODUODENOSCOPY N/A 09/28/2014   Procedure: ESOPHAGOGASTRODUODENOSCOPY (EGD);  Surgeon: Manya Silvas, MD;  Location: [facility] ENDOSCOPY;  Service: Endoscopy;  Laterality: N/A;   ESOPHAGOGASTRODUODENOSCOPY (EGD) WITH PROPOFOL N/A 04/26/2016   Procedure: ESOPHAGOGASTRODUODENOSCOPY (EGD) WITH PROPOFOL;  Surgeon: Manya Silvas, MD;  Location: [facility] ENDOSCOPY;  Service: Endoscopy;  Laterality: N/A;   ESOPHAGOGASTRODUODENOSCOPY (EGD) WITH PROPOFOL N/A 01/02/2022   Procedure: ESOPHAGOGASTRODUODENOSCOPY (EGD) WITH PROPOFOL;  Surgeon: Lesly Rubenstein, MD;  Location: [facility] ENDOSCOPY;  Service: Endoscopy;  Laterality: N/A;   excision of right axillary lipoma  02/2006   EYE SURGERY     FOOT SURGERY Right    HEMORRHOID SURGERY     HERNIA REPAIR  02/2006   NASAL SEPTUM SURGERY      SOCIAL HISTORY: Social History   Socioeconomic History   Marital status: Widowed    Spouse name: Not on file   Number of children: Not on file   Years of education: Not on file   Highest education level: Not on file  Occupational History   Not on file  Tobacco Use   Smoking status: Never    Passive exposure: Never   Smokeless tobacco: Never   Tobacco comments:    Never  Vaping Use   Vaping Use: Never used  Substance and Sexual Activity   Alcohol use: No    Alcohol/week: 0.0 standard drinks of alcohol   Drug use: No   Sexual activity: Not on file  Other Topics Concern   Not on file  Social History  Narrative   Not on file   Social Determinants of Health   Financial Resource Strain: Not on file  Food Insecurity: Not on file  Transportation Needs: Not on file  Physical Activity: Not on file  Stress: Not on file  Social Connections: Not on file  Intimate Partner Violence: Not on file    FAMILY HISTORY: Family History  Problem Relation Age of Onset   Heart disease Mother 63       heart attack   Cancer Father        lung   Diabetes Sister    Breast cancer Sister 73   Breast cancer Daughter 79   Stroke Brother    Diabetes Brother     ALLERGIES:  has No Known Allergies.  MEDICATIONS:  Current Outpatient Medications  Medication Sig Dispense Refill   albuterol (VENTOLIN HFA) 108 (90 Base) MCG/ACT inhaler Inhale 2 puffs into the lungs every 6 (six) hours as needed for wheezing  or shortness of breath. 3 each 1   aspirin 81 MG EC tablet Take 81 mg by mouth daily. Swallow whole.     [employer] SLOW RELEASE IRON 143 (45 Fe) MG TBCR 1 TABLET DAILY 90 tablet 2   Fluticasone-Umeclidin-Vilant (TRELEGY ELLIPTA IN) Inhale into the lungs.     iron dextran complex in sodium chloride 0.9 % 500 mL Inject into the vein once.     metoprolol succinate (TOPROL-XL) 25 MG 24 hr tablet Take 1 tablet (25 mg total) by mouth 2 (two) times daily. 180 tablet 1   montelukast (SINGULAIR) 10 MG tablet Take 1 tablet (10 mg total) by mouth at bedtime. 90 tablet 2   Multiple Vitamin (MULTIVITAMIN WITH MINERALS) TABS Take 1 tablet by mouth daily.     Omega-3 Fatty Acids (FISH OIL) 1000 MG CAPS Take 3,000 mg by mouth daily.     ondansetron (ZOFRAN-ODT) 4 MG disintegrating tablet Take 1 tablet (4 mg total) by mouth every 4 (four) hours as needed for nausea or vomiting. 30 tablet 0   pantoprazole (PROTONIX) 20 MG tablet Take 1 tablet (20 mg total) by mouth 2 (two) times daily. 180 tablet 1   rosuvastatin (CRESTOR) 10 MG tablet Take 1 tablet (10 mg total) by mouth daily. 90 tablet 1   No current facility-administered  medications for this visit.    Review of Systems  Constitutional:  Positive for fatigue. Negative for appetite change, chills and fever.  HENT:   Negative for hearing loss and voice change.   Eyes:  Negative for eye problems.  Respiratory:  Negative for chest tightness and cough.   Cardiovascular:  Negative for chest pain.  Gastrointestinal:  Positive for blood in stool. Negative for abdominal distention and abdominal pain.  Endocrine: Negative for hot flashes.  Genitourinary:  Negative for difficulty urinating and frequency.   Musculoskeletal:  Negative for arthralgias.  Skin:  Negative for itching and rash.  Neurological:  Negative for extremity weakness.  Hematological:  Negative for adenopathy.  Psychiatric/Behavioral:  Negative for confusion.     PHYSICAL EXAMINATION: ECOG PERFORMANCE STATUS: 1 - Symptomatic but completely ambulatory Vitals:   08/15/22 1306  BP: (!) 154/82  Pulse: 97  Resp: 18  Temp: (!) 96.8 F (36 C)  SpO2: 96%   Filed Weights   08/15/22 1306  Weight: 205 lb 3.2 oz (93.1 kg)    Physical Exam Constitutional:      General: She is not in acute distress. HENT:     Head: Normocephalic and atraumatic.  Eyes:     General: No scleral icterus. Cardiovascular:     Rate and Rhythm: Normal rate and regular rhythm.     Heart sounds: Normal heart sounds.  Pulmonary:     Effort: Pulmonary effort is normal. No respiratory distress.     Breath sounds: No wheezing.  Abdominal:     General: Bowel sounds are normal. There is no distension.     Palpations: Abdomen is soft.  Musculoskeletal:        General: No deformity. Normal range of motion.     Cervical back: Normal range of motion and neck supple.  Skin:    General: Skin is warm and dry.     Coloration: Skin is not pale.     Findings: No erythema or rash.  Neurological:     Mental Status: She is alert and oriented to person, place, and time. Mental status is at baseline.     Cranial Nerves: No  cranial  nerve deficit.     Coordination: Coordination normal.  Psychiatric:        Mood and Affect: Mood normal.      LABORATORY DATA:  I have reviewed the data as listed    Latest Ref Rng & Units 08/08/2022   10:52 AM 02/06/2022    8:56 AM 12/05/2021    1:59 PM  CBC  WBC 4.0 - 10.5 K/uL 5.1  4.7  5.2   Hemoglobin 12.0 - 15.0 g/dL 12.9  13.1  7.6 Repeated and verified X2.   Hematocrit 36.0 - 46.0 % 40.4  41.6  24.0   Platelets 150 - 400 K/uL 251  241  314.0       Latest Ref Rng & Units 05/30/2022   11:34 AM 11/29/2021    8:39 AM 05/10/2021    9:11 AM  CMP  Glucose 70 - 99 mg/dL 105  89  116   BUN 6 - 23 mg/dL 12  19  16    Creatinine 0.40 - 1.20 mg/dL 0.85  0.87  0.91   Sodium 135 - 145 mEq/L 140  140  141   Potassium 3.5 - 5.1 mEq/L 4.1  4.9  4.8   Chloride 96 - 112 mEq/L 103  104  103   CO2 19 - 32 mEq/L 29  30  30    Calcium 8.4 - 10.5 mg/dL 8.7  9.0  9.5   Total Protein 6.0 - 8.3 g/dL 6.8  6.5  7.2   Total Bilirubin 0.2 - 1.2 mg/dL 0.4  0.2  0.5   Alkaline Phos 39 - 117 U/L 60  62  64   AST 0 - 37 U/L 19  21  22    ALT 0 - 35 U/L 20  23  26        Component Value Date/Time   IRON 87 08/08/2022 1052   TIBC 463 (H) 08/08/2022 1052   FERRITIN 19 08/08/2022 1052   IRONPCTSAT 19 08/08/2022 1052     RADIOGRAPHIC STUDIES: I have personally reviewed the radiological images as listed and agreed with the findings in the report. No results found.

## 2022-08-15 NOTE — Assessment & Plan Note (Signed)
Cotinue PPI Advised to monitor for potential chronic blood loss and to report any changes in stool color

## 2022-09-04 ENCOUNTER — Ambulatory Visit: Payer: Medicare Other | Admitting: Internal Medicine

## 2022-10-13 ENCOUNTER — Telehealth: Payer: Self-pay | Admitting: Internal Medicine

## 2022-10-13 NOTE — Telephone Encounter (Signed)
Prescription Request  10/13/2022  LOV: 05/30/2022  What is the name of the medication or equipment? TRELEGY   Have you contacted your pharmacy to request a refill? Yes   Which pharmacy would you like this sent to[facility]   Patient notified that their request is being sent to the clinical staff for review and that they should receive a response within 2 business days.   Please advise at Mobile [phone] (mobile)

## 2022-10-16 NOTE — Telephone Encounter (Signed)
Pt called in to check status of previous med refill. As per pt, she needs a 3 months supply

## 2022-10-17 NOTE — Telephone Encounter (Signed)
Pt called back and I read the message to her and she stated it was the lung doctor who prescribed it and no she was not using it regularly

## 2022-10-17 NOTE — Telephone Encounter (Signed)
You can send in a refill. She is overdue a f/u with me.  Please schedule.  Thanks.

## 2022-10-17 NOTE — Telephone Encounter (Signed)
This was previously prescribed by pulmonology. Ok to refill for her?

## 2022-10-17 NOTE — Telephone Encounter (Signed)
LMTCB, need to know who was prescribing this before and if she is using regularly

## 2022-10-18 ENCOUNTER — Other Ambulatory Visit: Payer: Self-pay

## 2022-10-18 MED ORDER — TRELEGY ELLIPTA 100-62.5-25 MCG/ACT IN AEPB: 1.0000 | INHALATION_SPRAY | Freq: Every day | RESPIRATORY_TRACT | 0 refills | Status: DC

## 2022-10-18 NOTE — Telephone Encounter (Signed)
Confirmed dose taking with patient. Trellegy sent in and f/u appt scheduled

## 2022-10-30 ENCOUNTER — Ambulatory Visit (INDEPENDENT_AMBULATORY_CARE_PROVIDER_SITE_OTHER): Payer: Medicare Other | Admitting: Internal Medicine

## 2022-10-30 VITALS — BP 128/76 | HR 89 | Temp 97.9°F | Resp 16 | Ht 64.0 in | Wt 207.2 lb

## 2022-10-30 DIAGNOSIS — R739 Hyperglycemia, unspecified: Secondary | ICD-10-CM

## 2022-10-30 DIAGNOSIS — K219 Gastro-esophageal reflux disease without esophagitis: Secondary | ICD-10-CM

## 2022-10-30 DIAGNOSIS — E1165 Type 2 diabetes mellitus with hyperglycemia: Secondary | ICD-10-CM

## 2022-10-30 DIAGNOSIS — I10 Essential (primary) hypertension: Secondary | ICD-10-CM

## 2022-10-30 DIAGNOSIS — D509 Iron deficiency anemia, unspecified: Secondary | ICD-10-CM

## 2022-10-30 DIAGNOSIS — Z86010 Personal history of colonic polyps: Secondary | ICD-10-CM

## 2022-10-30 DIAGNOSIS — E7800 Pure hypercholesterolemia, unspecified: Secondary | ICD-10-CM

## 2022-10-30 DIAGNOSIS — M810 Age-related osteoporosis without current pathological fracture: Secondary | ICD-10-CM

## 2022-10-30 DIAGNOSIS — J4540 Moderate persistent asthma, uncomplicated: Secondary | ICD-10-CM

## 2022-10-30 DIAGNOSIS — D329 Benign neoplasm of meninges, unspecified: Secondary | ICD-10-CM

## 2022-10-30 DIAGNOSIS — G479 Sleep disorder, unspecified: Secondary | ICD-10-CM

## 2022-10-30 DIAGNOSIS — N3946 Mixed incontinence: Secondary | ICD-10-CM

## 2022-10-30 DIAGNOSIS — M5442 Lumbago with sciatica, left side: Secondary | ICD-10-CM

## 2022-10-30 LAB — HEMOGLOBIN A1C: Hgb A1c MFr Bld: 6.5 % (ref 4.6–6.5)

## 2022-10-30 LAB — TSH: TSH: 2.09 u[IU]/mL (ref 0.35–5.50)

## 2022-10-30 LAB — LIPID PANEL
Cholesterol: 149 mg/dL (ref 0–200)
HDL: 73.1 mg/dL (ref >39.00)
LDL Cholesterol: 49 mg/dL (ref 0–99)
NonHDL: 76.12
Total CHOL/HDL Ratio: 2
Triglycerides: 136 mg/dL (ref 0.0–149.0)
VLDL: 27.2 mg/dL (ref 0.0–40.0)

## 2022-10-30 LAB — HEPATIC FUNCTION PANEL
ALT: 44 U/L — ABNORMAL HIGH (ref 0–35)
AST: 47 U/L — ABNORMAL HIGH (ref 0–37)
Albumin: 4.3 g/dL (ref 3.5–5.2)
Alkaline Phosphatase: 71 U/L (ref 39–117)
Bilirubin, Direct: 0.1 mg/dL (ref 0.0–0.3)
Total Bilirubin: 0.4 mg/dL (ref 0.2–1.2)
Total Protein: 7.7 g/dL (ref 6.0–8.3)

## 2022-10-30 LAB — BASIC METABOLIC PANEL
BUN: 13 mg/dL (ref 6–23)
CO2: 28 mEq/L (ref 19–32)
Calcium: 8.9 mg/dL (ref 8.4–10.5)
Chloride: 103 mEq/L (ref 96–112)
Creatinine, Ser: 0.86 mg/dL (ref 0.40–1.20)
GFR: 63.61 mL/min (ref >60.00)
Glucose, Bld: 119 mg/dL — ABNORMAL HIGH (ref 70–99)
Potassium: 4.2 mEq/L (ref 3.5–5.1)
Sodium: 139 mEq/L (ref 135–145)

## 2022-10-30 LAB — HM DIABETES FOOT EXAM

## 2022-10-30 LAB — VITAMIN D 25 HYDROXY (VIT D DEFICIENCY, FRACTURES): VITD: 41.49 ng/mL (ref 30.00–100.00)

## 2022-10-30 MED ORDER — TRAMADOL HCL 50 MG PO TABS: 50.0000 mg | ORAL_TABLET | Freq: Two times a day (BID) | ORAL | 0 refills | Status: AC | PRN

## 2022-10-30 NOTE — Progress Notes (Addendum)
Subjective:    [name], female    DOB: 1941/05/08, 82 y.o.   MRN: [id number]  Patient here for  Chief Complaint  Patient presents with   Medical Management of Chronic Issues    HPI Here for follow up -hypercholesterolemia and hypertension. Seeing GI - IDA.  Hgb decreased to 7.6 recently. Has seen hematology as well.  S/p IV iron infusion.  Recommended EGD and colonoscopy.  Colonoscopy 01/02/22 - one 2 mm polyp in transverse colon. EGD 01/02/22 - hiatal hernia and reflux esophagitis. Seeing urology - mixed incontinence - recommended percutaneous tibial nerve stimulation.  Receiving prolia.  Saw neurology 06/20/22 - follow up - headaches and feeling swimmy headed.  Diagnosed with occipital neuralgia versus post concussive headache syndrome.  Recommended continuing neck exercises and was started on baclofen. Meningioma - stable since 2016. Also BPV diagnosed.  Has seen ENT.  Recommended doing epley maneuver and exercises. Has been seeing Dr Cathie Hoops for IDA.  Last check 08/15/22 - no need for additional IV venofer.  Continue oral ferrous sulfate. Overall doing better.  Breathing stable.  Feels trelegy works well for her.  Unable to afford. Discussed with her regarding med management.  Agreeable for referral to see if can get help with cost. Does report having issues with increased acid reflux.  Taking protonix 20mg .  Will increase to 40mg .  Reports problems sleeping.  Snores.  Does not feel rested.  No chest pain.  No abdominal pain reported.  Does report persistent intermittent back pain.  (Worse at times).  Has had injections and PT.  Request to have tramadol - to take prn. Does not take often.     Past Medical History:  Diagnosis Date   Asthma    Fibrocystic breast disease    Gastritis    EGD - schatzki's ring, gastritis and gastric polyps   History of colon polyps    Hypercholesterolemia    Hypertension    MAI (mycobacterium avium-intracellulare) infection (HCC)    Osteoporosis     Reflux esophagitis    Past Surgical History:  Procedure Laterality Date   ABDOMINAL HYSTERECTOMY  1998   bladder tact     BREAST EXCISIONAL BIOPSY Bilateral 1970's   benign   BREAST EXCISIONAL BIOPSY Bilateral 2010   pt  stated dr Katrinka Blazing did bilat axilla lumps neg    BREAST SURGERY     cyst(benign)   CATARACT EXTRACTION W/PHACO Left 06/13/2021   Procedure: CATARACT EXTRACTION PHACO AND INTRAOCULAR LENS PLACEMENT (IOC) LEFT;  Surgeon: Nevada Crane, MD;  Location: [facility];  Service: Ophthalmology;  Laterality: Left;  7.04 0:40.5   CATARACT EXTRACTION W/PHACO Right 06/27/2021   Procedure: CATARACT EXTRACTION PHACO AND INTRAOCULAR LENS PLACEMENT (IOC) RIGHT;  Surgeon: Nevada Crane, MD;  Location: [facility];  Service: Ophthalmology;  Laterality: Right;  3.32 0:30.7   COLONOSCOPY N/A 09/28/2014   Procedure: COLONOSCOPY;  Surgeon: Scot Jun, MD;  Location: [facility] ENDOSCOPY;  Service: Endoscopy;  Laterality: N/A;   COLONOSCOPY WITH PROPOFOL N/A 01/02/2022   Procedure: COLONOSCOPY WITH PROPOFOL;  Surgeon: Regis Bill, MD;  Location: [facility] ENDOSCOPY;  Service: Endoscopy;  Laterality: N/A;   ESOPHAGOGASTRODUODENOSCOPY N/A 09/28/2014   Procedure: ESOPHAGOGASTRODUODENOSCOPY (EGD);  Surgeon: Scot Jun, MD;  Location: [facility] END[facility]COPY;  Service: Endoscopy;  Laterality: N/A;   ESOPHAGOGASTRODUODENOSCOPY (EGD) WITH PROPOFOL N/A 04/26/2016   Procedure: ESOPHAGOGASTRODUODENOSCOPY (EGD) WITH PROPOFOL;  Surgeon: Scot Jun, MD;  Location: [facility] ENDOSCOPY;  Service: Endoscopy;  Laterality: N/A;   ESOPHAGOGASTRODUODENOSCOPY (EGD) WITH PROPOFOL N/A 01/02/2022   Procedure: ESOPHAGOGASTRODUODENOSCOPY (EGD) WITH PROPOFOL;  Surgeon: Regis Bill, MD;  Location: [facility] ENDOSCOPY;  Service: Endoscopy;  Laterality: N/A;   excision of right axillary lipoma  02/2006   EYE SURGERY     FOOT SURGERY Right    HEMORRHOID SURGERY     HERNIA REPAIR  02/2006   NASAL  SEPTUM SURGERY     Family History  Problem Relation Age of Onset   Heart disease Mother 93       heart attack   Cancer Father        lung   Diabetes Sister    Breast cancer Sister 55   Breast cancer Daughter 34   Stroke Brother    Diabetes Brother    Social History   Socioeconomic History   Marital status: Widowed    Spouse name: Not on file   Number of children: Not on file   Years of education: Not on file   Highest education level: Not on file  Occupational History   Not on file  Tobacco Use   Smoking status: Never    Passive exposure: Never   Smokeless tobacco: Never   Tobacco comments:    Never  Vaping Use   Vaping Use: Never used  Substance and Sexual Activity   Alcohol use: No    Alcohol/week: 0.0 standard drinks of alcohol   Drug use: No   Sexual activity: Not on file  Other Topics Concern   Not on file  Social History Narrative   Not on file   Social Determinants of Health   Financial Resource Strain: Not on file  Food Insecurity: Not on file  Transportation Needs: Not on file  Physical Activity: Not on file  Stress: Not on file  Social Connections: Not on file     Review of Systems  Constitutional:  Positive for fatigue. Negative for appetite change and unexpected weight change.  HENT:  Negative for congestion and sinus pressure.   Respiratory:  Negative for cough and chest tightness.        Breathing stable.   Cardiovascular:  Negative for chest pain, palpitations and leg swelling.  Gastrointestinal:  Negative for abdominal pain, diarrhea, nausea and vomiting.       Increased acid reflux.   Genitourinary:  Negative for difficulty urinating and dysuria.  Musculoskeletal:  Positive for back pain. Negative for myalgias.  Skin:  Negative for color change and rash.  Neurological:  Negative for dizziness and headaches.  Psychiatric/Behavioral:  Negative for agitation and dysphoric mood.        Objective:     BP 128/76   Pulse 89   Temp  97.9 F (36.6 C)   Resp 16   Ht 5\' 4"  (1.626 m)   Wt 207 lb 3.2 oz (94 kg)   SpO2 97%   BMI 35.57 kg/m  Wt Readings from Last 3 Encounters:  10/30/22 207 lb 3.2 oz (94 kg)  08/15/22 205 lb 3.2 oz (93.1 kg)  05/30/22 204 lb 9.6 oz (92.8 kg)    Physical Exam Vitals reviewed.  Constitutional:      General: She is not in acute distress.    Appearance: Normal appearance.  HENT:     Head: Normocephalic and atraumatic.     Right Ear: External ear normal.     Left Ear: External ear normal.  Eyes:     General: No scleral icterus.  Right eye: No discharge.        Left eye: No discharge.     Conjunctiva/sclera: Conjunctivae normal.  Neck:     Thyroid: No thyromegaly.  Cardiovascular:     Rate and Rhythm: Normal rate and regular rhythm.  Pulmonary:     Effort: No respiratory distress.     Breath sounds: Normal breath sounds. No wheezing.  Abdominal:     General: Bowel sounds are normal.     Palpations: Abdomen is soft.     Tenderness: There is no abdominal tenderness.  Musculoskeletal:        General: No swelling or tenderness.     Cervical back: Neck supple. No tenderness.  Lymphadenopathy:     Cervical: No cervical adenopathy.  Skin:    Findings: No erythema or rash.  Neurological:     Mental Status: She is alert.  Psychiatric:        Mood and Affect: Mood normal.        Behavior: Behavior normal.      Outpatient Encounter Medications as of 10/30/2022  Medication Sig   pantoprazole (PROTONIX) 40 MG tablet Take 1 tablet (40 mg total) by mouth 2 (two) times daily before a meal.   traMADol (ULTRAM) 50 MG tablet Take 1 tablet (50 mg total) by mouth 2 (two) times daily as needed for up to 10 days.   albuterol (VENTOLIN HFA) 108 (90 Base) MCG/ACT inhaler Inhale 2 puffs into the lungs every 6 (six) hours as needed for wheezing or shortness of breath.   aspirin 81 MG EC tablet Take 81 mg by mouth daily. Swallow whole.   [employer] SLOW RELEASE IRON 143 (45 Fe) MG TBCR 1 TABLET  DAILY   Fluticasone-Umeclidin-Vilant (TRELEGY ELLIPTA) 100-62.5-25 MCG/ACT AEPB Inhale 1 puff into the lungs daily.   iron dextran complex in sodium chloride 0.9 % 500 mL Inject into the vein once.   metoprolol succinate (TOPROL-XL) 25 MG 24 hr tablet Take 1 tablet (25 mg total) by mouth 2 (two) times daily.   montelukast (SINGULAIR) 10 MG tablet Take 1 tablet (10 mg total) by mouth at bedtime.   Multiple Vitamin (MULTIVITAMIN WITH MINERALS) TABS Take 1 tablet by mouth daily.   Omega-3 Fatty Acids (FISH OIL) 1000 MG CAPS Take 3,000 mg by mouth daily.   ondansetron (ZOFRAN-ODT) 4 MG disintegrating tablet Take 1 tablet (4 mg total) by mouth every 4 (four) hours as needed for nausea or vomiting.   rosuvastatin (CRESTOR) 10 MG tablet Take 1 tablet (10 mg total) by mouth daily.   [DISCONTINUED] pantoprazole (PROTONIX) 20 MG tablet Take 1 tablet (20 mg total) by mouth 2 (two) times daily.   No facility-administered encounter medications on file as of 10/30/2022.     Lab Results  Component Value Date   WBC 5.1 08/08/2022   HGB 12.9 08/08/2022   HCT 40.4 08/08/2022   PLT 251 08/08/2022   GLUCOSE 119 (H) 10/30/2022   CHOL 149 10/30/2022   TRIG 136.0 10/30/2022   HDL 73.10 10/30/2022   LDLCALC 49 10/30/2022   ALT 44 (H) 10/30/2022   AST 47 (H) 10/30/2022   NA 139 10/30/2022   K 4.2 10/30/2022   CL 103 10/30/2022   CREATININE 0.86 10/30/2022   BUN 13 10/30/2022   CO2 28 10/30/2022   TSH 2.09 10/30/2022   HGBA1C 6.5 10/30/2022   MICROALBUR <0.7 05/30/2022    MM DIAG BREAST TOMO UNI LEFT  Result Date: 05/17/2022 CLINICAL DATA:  82 year old female recalled  from screening mammogram dated 05/03/2022 for a possible left breast asymmetry. EXAM: DIGITAL DIAGNOSTIC UNILATERAL LEFT MAMMOGRAM WITH TOMOSYNTHESIS; ULTRASOUND LEFT BREAST LIMITED TECHNIQUE: Left digital diagnostic mammography and breast tomosynthesis was performed.; Targeted ultrasound examination of the left breast was performed.  COMPARISON:  Previous exam(s). ACR Breast Density Category b: There are scattered areas of fibroglandular density. FINDINGS: Previously described, possible asymmetry in the lateral left breast at posterior depth on the cc projection only effaces into fibroglandular tissue on today's additional views. Precautionary ultrasound was performed. Targeted ultrasound is performed, showing no focal or suspicious sonographic abnormality in the lateral left breast. IMPRESSION: No mammographic or sonographic evidence of malignancy. RECOMMENDATION: Screening mammogram in one year.(Code:SM-B-01Y) I have discussed the findings and recommendations with the patient. If applicable, a reminder letter will be sent to the patient regarding the next appointment. BI-RADS CATEGORY  1: Negative. Electronically Signed   By: Sande Brothers M.D.   On: 05/17/2022 15:57   US BREAST LTD UNI LEFT INC AXILLA  Result Date: 05/17/2022 CLINICAL DATA:  82 year old female recalled from screening mammogram dated 05/03/2022 for a possible left breast asymmetry. EXAM: DIGITAL DIAGNOSTIC UNILATERAL LEFT MAMMOGRAM WITH TOMOSYNTHESIS; ULTRASOUND LEFT BREAST LIMITED TECHNIQUE: Left digital diagnostic mammography and breast tomosynthesis was performed.; Targeted ultrasound examination of the left breast was performed. COMPARISON:  Previous exam(s). ACR Breast Density Category b: There are scattered areas of fibroglandular density. FINDINGS: Previously described, possible asymmetry in the lateral left breast at posterior depth on the cc projection only effaces into fibroglandular tissue on today's additional views. Precautionary ultrasound was performed. Targeted ultrasound is performed, showing no focal or suspicious sonographic abnormality in the lateral left breast. IMPRESSION: No mammographic or sonographic evidence of malignancy. RECOMMENDATION: Screening mammogram in one year.(Code:SM-B-01Y) I have discussed the findings and recommendations with the  patient. If applicable, a reminder letter will be sent to the patient regarding the next appointment. BI-RADS CATEGORY  1: Negative. Electronically Signed   By: Sande Brothers M.D.   On: 05/17/2022 15:57      Assessment & Plan:  Hypercholesterolemia Assessment & Plan: Continue Crestor.  Low-cholesterol diet and exercise.  Follow-up fast lipid profile liver panel.  Orders: -     TSH -     Lipid panel -     Hepatic function panel  Hyperglycemia Assessment & Plan: Low-carb diet and exercise.  Follow metabolic panel and A1c.   Type 2 diabetes mellitus with hyperglycemia, without long-term current use of insulin (HCC) Assessment & Plan: Low carb diet and exercise.  Follow met b and a1c.    Orders: -     Hemoglobin A1c  Essential hypertension, benign Assessment & Plan: Blood pressure as outlined.  Recheck improved. On metoprolol.  Continue current medication. Follow pressures. Follow metabolic panel.   Orders: -     Basic metabolic panel  Osteoporosis without current pathological fracture, unspecified osteoporosis type Assessment & Plan: Prolia 06/13/22. Vitamin D level 10/30/22 - 41.   Orders: -     VITAMIN D 25 Hydroxy (Vit-D Deficiency, Fractures)  Moderate persistent asthma, unspecified whether complicated -     AMB Referral to Pharmacy Medication Management  Iron deficiency anemia, unspecified iron deficiency anemia type Assessment & Plan: Seeing hematology (Dr Cathie Hoops) - s/p IV venofer. 01/02/22 upper endoscopy showed LA Grade B reflux esophagitis with no bleeding, - 5 cm hiatal hernia with a few Cameron ulcers.- Erythematous mucosa in the antrum. Biopsy showed erosive gastritis. Colonoscopy 2mm polyp.  Last hgb 12.9.  Gastroesophageal reflux disease, unspecified whether esophagitis present Assessment & Plan: Increased acid reflux.  Currently on protonix 20mg  bid.  Will increase to 40mg  bid.  Follow.  Call with update.    History of colonic polyps Assessment &  Plan: Colonoscopy 01/02/22 - COLON POLYP, TRANSVERSE; COLD BIOPSY:  - TUBULAR ADENOMA.  - NEGATIVE FOR HIGH-GRADE DYSPLASIA AND MALIGNANCY.    Left-sided low back pain with left-sided sciatica, unspecified chronicity Assessment & Plan: Persistent intermittent flares.  Has seen ortho.  S/p injections.  Has been to PT.  Relatively stable.  Request tramadol to have to take prn - bad flares.  Rarely takes.     Meningioma [facility]) Assessment & Plan: Saw neurology 06/20/22 - follow up - headaches and feeling swimmy headed.  Diagnosed with occipital neuralgia versus post concussive headache syndrome.  Recommended continuing neck exercises and was started on baclofen. Meningioma - stable since 2016. Also BPV diagnosed.  Has seen ENT.  Recommended doing epley maneuver and exercises.   Mixed incontinence Assessment & Plan: Dr Sherron Monday 01/09/22 - Start percutaneous tibial nerve stimulation.  Can always consider InterStim in the future. PTNS a good option based on age and comorbidities.  Has f/u with Dr Sherron Monday - 01/2023.    Sleeping difficulty Assessment & Plan: Feel multifactorial.  With increased fatigue, not feeling rested, nocturia - concern regarding sleep apnea.  Reports seeing pulmonary in July.     Other orders -     traMADol HCl; Take 1 tablet (50 mg total) by mouth 2 (two) times daily as needed for up to 10 days.  Dispense: 20 tablet; Refill: 0 -     Pantoprazole Sodium; Take 1 tablet (40 mg total) by mouth 2 (two) times daily before a meal.  Dispense: 60 tablet; Refill: 2     Dale Marinette, MD

## 2022-10-31 ENCOUNTER — Telehealth: Payer: Self-pay

## 2022-10-31 DIAGNOSIS — R7989 Other specified abnormal findings of blood chemistry: Secondary | ICD-10-CM

## 2022-10-31 NOTE — Telephone Encounter (Signed)
Patient states she is returning a call from Rita Ohara, LPN.  I read Dr. Westley Hummer Scott's message to patient and scheduled her for a non-fasting lab visit in two weeks.

## 2022-10-31 NOTE — Progress Notes (Signed)
   Care Guide Note  10/31/2022 Name: Angela Cohen MRN: [id number] DOB: [birth year]  Referred by: Dale Salvisa, MD Reason for referral : Care Coordination (Outreach to schedule with pharm d )   Angela Cohen is a 82 y.o. year old female who is a primary care patient of Dale Beulah Beach, MD. Angela Cohen was referred to the pharmacist for assistance related to DM.    Successful contact was made with the patient to discuss pharmacy services including being ready for the pharmacist to call at least 5 minutes before the scheduled appointment time, to have medication bottles and any blood sugar or blood pressure readings ready for review. The patient agreed to meet with the pharmacist via with the pharmacist via telephone visit on (date/time).  11/30/2022  Penne Lash, RMA Care Guide [facility]  [place], [place] [address] Direct Dial: [phone] [email or web address]

## 2022-10-31 NOTE — Telephone Encounter (Signed)
-----   Message from Dale Elmwood, MD sent at 10/31/2022  7:58 AM EDT ----- Notify - Cholesterol levels look good.  Overall sugar control increased some from last check. Liver function tests increased some as well.  Could be due to fatty liver.  Will need to follow. Recommend a low carb diet.  Vitamin D level and thyroid test wnl.  Recheck liver panel in 10-14 days.  (Non fasting lab).

## 2022-10-31 NOTE — Telephone Encounter (Signed)
Noted  

## 2022-10-31 NOTE — Telephone Encounter (Signed)
LMTCB ok to give results and schedule lab appt

## 2022-11-04 ENCOUNTER — Encounter: Payer: Self-pay | Admitting: Internal Medicine

## 2022-11-04 MED ORDER — PANTOPRAZOLE SODIUM 40 MG PO TBEC: 40.0000 mg | DELAYED_RELEASE_TABLET | Freq: Two times a day (BID) | ORAL | 2 refills | Status: DC

## 2022-11-04 NOTE — Assessment & Plan Note (Signed)
Low carb diet and exercise.  Follow met b and a1c.   

## 2022-11-04 NOTE — Assessment & Plan Note (Signed)
Colonoscopy 01/02/22 - COLON POLYP, TRANSVERSE; COLD BIOPSY:  - TUBULAR ADENOMA.  - NEGATIVE FOR HIGH-GRADE DYSPLASIA AND MALIGNANCY.  

## 2022-11-04 NOTE — Assessment & Plan Note (Signed)
Dr Sherron Monday 01/09/22 - Start percutaneous tibial nerve stimulation.  Can always consider InterStim in the future. PTNS a good option based on age and comorbidities.  Has f/u with Dr Sherron Monday - 01/2023.

## 2022-11-04 NOTE — Assessment & Plan Note (Signed)
Saw neurology 06/20/22 - follow up - headaches and feeling swimmy headed.  Diagnosed with occipital neuralgia versus post concussive headache syndrome.  Recommended continuing neck exercises and was started on baclofen. Meningioma - stable since 2016. Also BPV diagnosed.  Has seen ENT.  Recommended doing epley maneuver and exercises.

## 2022-11-04 NOTE — Assessment & Plan Note (Signed)
Continue Crestor.  Low-cholesterol diet and exercise.  Follow-up fast lipid profile liver panel. 

## 2022-11-04 NOTE — Assessment & Plan Note (Signed)
Feel multifactorial.  With increased fatigue, not feeling rested, nocturia - concern regarding sleep apnea.  Reports seeing pulmonary in July.

## 2022-11-04 NOTE — Assessment & Plan Note (Signed)
Increased acid reflux.  Currently on protonix 20mg  bid.  Will increase to 40mg  bid.  Follow.  Call with update.

## 2022-11-04 NOTE — Assessment & Plan Note (Signed)
Prolia 06/13/22. Vitamin D level 10/30/22 - 41.

## 2022-11-04 NOTE — Assessment & Plan Note (Signed)
Seeing hematology (Dr Cathie Hoops) - s/p IV venofer. 01/02/22 upper endoscopy showed LA Grade B reflux esophagitis with no bleeding, - 5 cm hiatal hernia with a few Cameron ulcers.- Erythematous mucosa in the antrum. Biopsy showed erosive gastritis. Colonoscopy 2mm polyp.  Last hgb 12.9.

## 2022-11-04 NOTE — Assessment & Plan Note (Signed)
Low-carb diet and exercise.  Follow metabolic panel and A1c. 

## 2022-11-04 NOTE — Assessment & Plan Note (Addendum)
Blood pressure as outlined.  Recheck improved. On metoprolol.  Continue current medication. Follow pressures. Follow metabolic panel.

## 2022-11-04 NOTE — Assessment & Plan Note (Signed)
Persistent intermittent flares.  Has seen ortho.  S/p injections.  Has been to PT.  Relatively stable.  Request tramadol to have to take prn - bad flares.  Rarely takes.

## 2022-11-06 ENCOUNTER — Other Ambulatory Visit: Payer: Self-pay | Admitting: Internal Medicine

## 2022-11-14 ENCOUNTER — Other Ambulatory Visit (INDEPENDENT_AMBULATORY_CARE_PROVIDER_SITE_OTHER): Payer: Medicare Other

## 2022-11-14 DIAGNOSIS — R7989 Other specified abnormal findings of blood chemistry: Secondary | ICD-10-CM | POA: Diagnosis not present

## 2022-11-14 LAB — HEPATIC FUNCTION PANEL
ALT: 23 U/L (ref 0–35)
AST: 25 U/L (ref 0–37)
Albumin: 4.1 g/dL (ref 3.5–5.2)
Alkaline Phosphatase: 50 U/L (ref 39–117)
Bilirubin, Direct: 0.1 mg/dL (ref 0.0–0.3)
Total Bilirubin: 0.3 mg/dL (ref 0.2–1.2)
Total Protein: 7 g/dL (ref 6.0–8.3)

## 2022-11-17 ENCOUNTER — Telehealth: Payer: Self-pay | Admitting: Internal Medicine

## 2022-11-17 ENCOUNTER — Telehealth: Payer: Self-pay | Admitting: *Deleted

## 2022-11-17 NOTE — Telephone Encounter (Signed)
Noted  

## 2022-11-17 NOTE — Telephone Encounter (Signed)
$  0 due; PA not required-appt already scheduled

## 2022-11-17 NOTE — Telephone Encounter (Signed)
The patient was given lab results from 11/14/22.

## 2022-11-20 ENCOUNTER — Ambulatory Visit: Payer: Medicare Other | Admitting: Urology

## 2022-11-30 ENCOUNTER — Other Ambulatory Visit: Payer: Medicare Other | Admitting: Pharmacist

## 2022-11-30 MED ORDER — BREZTRI AEROSPHERE 160-9-4.8 MCG/ACT IN AERO: 2.0000 | INHALATION_SPRAY | Freq: Two times a day (BID) | RESPIRATORY_TRACT | Status: DC

## 2022-11-30 NOTE — Progress Notes (Signed)
I can address it then, I can give her a trial of Breztri with samples from the office and see how she does with that.

## 2022-11-30 NOTE — Patient Instructions (Signed)
Lowanda,   It was great talking to you today!  We'll talk to Dr. Jayme Cloud about pursing assistance for [facility] as a replace for Trelegy from [employer].   Please reach out with any questions!  [name], PharmD, BCACP, CPP Clinical Pharmacist [facility] [phone]

## 2022-11-30 NOTE — Progress Notes (Signed)
11/30/2022 Name: Angela Cohen MRN: [id number] DOB: 1941/01/05  Chief Complaint  Patient presents with   Medication Management    Angela Cohen is a 82 y.o. year old female who presented for a telephone visit.   They were referred to the pharmacist by their PCP for assistance in managing medication access.    Subjective:  Care Team: Primary Care Provider: Dale Upland, MD ; Next Scheduled Visit: 03/04/13  Medication Access/Adherence  Current Pharmacy:  [id number] [place], [place] - [address] [place] [address] Phone: [phone] Fax: [phone]  [facility] [address] [place] - [address] [place] [address] Phone: [phone] Fax: [phone]  [facility] - [place], [place] - [address] [place] [address] Phone: [phone] Fax: [phone]   Patient reports affordability concerns with their medications: Yes  Patient reports access/transportation concerns to their pharmacy: No  Patient reports adherence concerns with their medications:  No     Hypertension:  Current medications: metoprolol succinate 25 mg daily  Hyperlipidemia/ASCVD Risk Reduction  Current lipid lowering medications: rosuvastatin 10 mg daily  Antiplatelet regimen: aspirin 81 mg    COPD:  Current medications: Trelegy - 1 puff ; albuterol HFA PRN -   Reports she has been unable to take Trelegy consistently due to cost. Pharmacy told her $800 for a 90 day supply, it's likely that she has a deductible to meet.    Objective:  Lab Results  Component Value Date   HGBA1C 6.5 10/30/2022    Lab Results  Component Value Date   CREATININE 0.86 10/30/2022   BUN 13 10/30/2022   NA 139 10/30/2022   K 4.2 10/30/2022   CL 103 10/30/2022   CO2 28 10/30/2022    Lab Results  Component Value Date   CHOL 149 10/30/2022   HDL 73.10 10/30/2022   LDLCALC 49 10/30/2022   TRIG  136.0 10/30/2022   CHOLHDL 2 10/30/2022    Medications Reviewed Today     Reviewed by Alden Hipp, RPH-CPP (Pharmacist) on 11/30/22 at [phone]  Med List Status: <None>   Medication Order Taking? Sig Documenting Provider Last Dose Status Informant  albuterol (VENTOLIN HFA) 108 (90 Base) MCG/ACT inhaler [id number] No Inhale 2 puffs into the lungs every 6 (six) hours as needed for wheezing or shortness of breath.  Patient not taking: Reported on 11/30/2022   Salena Saner, MD Not Taking Active   aspirin 81 MG EC tablet [id number] Yes Take 81 mg by mouth daily. Swallow whole. [provider] Taking Active Self           Med Note (NEWCOMER [facility], BRANDY L   Mon Feb 02, 2020 11:28 AM)    calcium carbonate (OSCAL) 1500 (600 Ca) MG TABS tablet [id number] Yes Take by mouth 2 (two) times daily with a meal. [provider] Taking Active   cholecalciferol (VITAMIN D3) 25 MCG (1000 UNIT) tablet [id number] Yes Take 1,000 Units by mouth daily. [provider] Taking Active   [facility] SLOW RELEASE IRON 143 (45 Fe) MG TBCR [id number] Yes 1 TABLET DAILY Dale Mondovi, MD Taking Active   Fluticasone-Umeclidin-Vilant (TRELEGY ELLIPTA) 100-62.5-25 MCG/ACT AEPB [id number] Yes Inhale 1 puff into the lungs daily. Dale Piggott, MD Taking Active   iron dextran complex in sodium chloride 0.9 % 500 mL [id number]  Inject into the vein once. [provider]  Active  metoprolol succinate (TOPROL-XL) 25 MG 24 hr tablet [id number] Yes Take 1 tablet (25 mg total) by mouth 2 (two) times daily. Dale Duchesne, MD Taking Active   montelukast (SINGULAIR) 10 MG tablet [id number] Yes Take 1 tablet (10 mg total) by mouth at bedtime. Dale [place], MD Taking Active   Multiple Vitamin (MULTIVITAMIN WITH MINERALS) TABS [id number] Yes Take 1 tablet by mouth daily. [provider] Taking Active Self  Omega-3 Fatty Acids (FISH OIL) 1000 MG CAPS [id number] Yes Take 3,000 mg by mouth daily.  [provider] Taking Active Self           Med Note ([facility]COMER [facility], BRANDY L   Mon Feb 02, 2020 11:28 AM)    pantoprazole (PROTONIX) 40 MG tablet [id number] Yes Take 1 tablet (40 mg total) by mouth 2 (two) times daily before a meal. Dale [place], MD Taking Active   rosuvastatin (CRESTOR) 10 MG tablet [id number] Yes TAKE 1 TABLET DAILY Dale Cobb, MD Taking Active               Assessment/Plan:   Hypertension: - Currently controlled - Recommend to continue current regimen    Hyperlipidemia/ASCVD Risk Reduction: - Currently controlled.  - Recommend to continue current regimen    COPD: - Currently uncontrolled due to medication access  - Discussed income and pharmacy spend. She meets income requirement for both [employer] and [place], but has not spent the $600 out of pocket required for [employer]. She would qualify for [employer]. Will discuss with Dr. Jayme Cloud.    Follow Up Plan: will collaborate with Jayme Cloud for assistance  [name], PharmD, BCACP, CPP Clinical Pharmacist [facility] [phone]

## 2022-11-30 NOTE — Addendum Note (Signed)
Addended by: Nilda Simmer T on: 11/30/2022 12:29 PM   Modules accepted: Orders

## 2022-12-06 ENCOUNTER — Telehealth: Payer: Self-pay

## 2022-12-06 ENCOUNTER — Other Ambulatory Visit (HOSPITAL_COMMUNITY): Payer: Self-pay

## 2022-12-06 ENCOUNTER — Encounter: Payer: Self-pay | Admitting: Pulmonary Disease

## 2022-12-06 ENCOUNTER — Encounter: Payer: Self-pay | Admitting: Oncology

## 2022-12-06 ENCOUNTER — Ambulatory Visit: Payer: Medicare Other | Admitting: Pulmonary Disease

## 2022-12-06 VITALS — BP 128/84 | HR 85 | Temp 96.9°F | Ht 64.0 in | Wt 210.0 lb

## 2022-12-06 DIAGNOSIS — Z6836 Body mass index (BMI) 36.0-36.9, adult: Secondary | ICD-10-CM

## 2022-12-06 DIAGNOSIS — R053 Chronic cough: Secondary | ICD-10-CM

## 2022-12-06 DIAGNOSIS — J45909 Unspecified asthma, uncomplicated: Secondary | ICD-10-CM | POA: Diagnosis not present

## 2022-12-06 DIAGNOSIS — R0602 Shortness of breath: Secondary | ICD-10-CM | POA: Diagnosis not present

## 2022-12-06 DIAGNOSIS — K449 Diaphragmatic hernia without obstruction or gangrene: Secondary | ICD-10-CM | POA: Diagnosis not present

## 2022-12-06 DIAGNOSIS — K219 Gastro-esophageal reflux disease without esophagitis: Secondary | ICD-10-CM

## 2022-12-06 MED ORDER — TRELEGY ELLIPTA 100-62.5-25 MCG/ACT IN AEPB: 1.0000 | INHALATION_SPRAY | Freq: Every day | RESPIRATORY_TRACT | 3 refills | Status: DC

## 2022-12-06 MED ORDER — TRELEGY ELLIPTA 100-62.5-25 MCG/ACT IN AEPB: 1.0000 | INHALATION_SPRAY | Freq: Every day | RESPIRATORY_TRACT | Status: DC

## 2022-12-06 MED ORDER — BUDESONIDE 0.25 MG/2ML IN SUSP: 0.2500 mg | Freq: Two times a day (BID) | RESPIRATORY_TRACT | 12 refills | Status: DC

## 2022-12-06 NOTE — Patient Instructions (Addendum)
We are going to get some breathing tests.  We are going to get a CT of the chest.  We provided you with samples of the Trelegy that would last you for 1 month.  Let us know what you think about the nebulizer medication.  We will see you in follow-up in 6 to 8 weeks time call sooner should any new problems arise.

## 2022-12-06 NOTE — Telephone Encounter (Signed)
PAP application for BREZTRI([place]&ME)  has been mailed to pt home.   I will fax PCP pages once I receive pt pages  PLEASE BE ADVISED

## 2022-12-06 NOTE — Telephone Encounter (Signed)
-----   Message from [employer] sent at 12/06/2022  1:32 PM EDT ----- No worries, I appreciate you staying on top of this!  Catie ----- Message ----- From: Earnest Conroy, CPhT Sent: 12/06/2022  11:39 AM EDT To: Alden Hipp, RPH-CPP  Hello Catie I just wanted you to know that AZ&ME site is down under maintenance and has been since Monday no giving time of when it will be back up,  so I called and they are fax me the application and then I will mail to pt. Sorry for the slow response I was thinking it would be back up by now.  Thanks Joni Reining ----- Message ----- From: Alden Hipp, RPH-CPP Sent: 12/04/2022  10:14 AM EDT To: Earnest Conroy, CPhT  Joni Reining,   Sorry, could you please start the process for AZ&Me?  Thanks!  Catie ----- Message ----- From: Earnest Conroy, CPhT Sent: 12/04/2022  10:01 AM EDT To: Alden Hipp, RPH-CPP  I have a question are you asking for the AZ&ME app to be initiated to get the app started or have that already been done ----- Message ----- From: Alden Hipp, RPH-CPP Sent: 11/30/2022  12:29 PM EDT To: Rx Med Assistance Team  Sorry for the prior message being cut off. [place] for [employer], send provider portion to Dr. Lorin Picket. Thanks!

## 2022-12-06 NOTE — Progress Notes (Signed)
Subjective:    [name], female    DOB: 19-Nov-1940, 82 y.o.   MRN: [id number]  Patient Care Team: Dale [place], MD as PCP - General (Internal Medicine) [name], MD as PCP - Cardiology (Cardiology)  Chief Complaint  Patient presents with   Follow-up    DOE. Wheezing at night. Cough with clear sputum. Loud snoring.    HPI Angela Cohen is an 82 year old lifelong never smoker with known moderate persistent asthma and medical issues as noted below, who presents for follow-up after being lost to follow-up since 16 December 2020.  The patient has had issues with getting Trelegy Ellipta covered by her insurance I had a reasonable co-pay.  She notes that Trelegy Ellipta is the medication that has worked the best for control of her moderate persistent asthma.  She actually ran out of the medication last week and was given a prescription by primary care for [facility].  She has not started the [employer].  She is concerned because she has osteoarthritis of the hands and cannot actuate metered-dose inhaler as well.  She cannot use albuterol as rescue due to this issue.  She really would like to stay on Trelegy Ellipta.  She has not had any fevers, chills or sweats.  She has noted some wheezing at nighttime since discontinuing the Trelegy.  She also has cough that at times his productive of copious clear mucus particularly at nighttime.  She has severe issues with gastroesophageal reflux she has been noted to have esophageal dysmotility and a hiatal hernia that is at least 5 cm in size.  She does note frank reflux at nighttime that sometimes wakes her up.  She does not endorse any issues with snoring, no issues with restorative sleep.  Feels refreshed in the mornings when she arises.  No daytime somnolence.  She wonders if she needs a sleep study.  She had not qualified for medication assistance through [employer] due to not having paid $600 in medication co-pays.  She is trying to discuss further  with her health plan provider.  The patient has been well compensated on Trelegy since at least September 2021.  She had at that time being evaluated by Dr. Graylin Shiver at [facility] pulmonology.  She was also started on montelukast at that time.  DATA: 2D echo on 02/24/2020: LVEF 60 to 65%, no wall motion abnormalities, grade 1 DD, normal pulmonary artery pressure.  No valve disease noted. Pulmonary function testing 03/03/2020: Small airways obstructive disease with restrictive physiology likely due to obesity.  Diffusion capacity normal  Review of Systems A 10 point review of systems was performed and it is as noted above otherwise negative.   Past Medical History:  Diagnosis Date   Asthma    Fibrocystic breast disease    Gastritis    EGD - schatzki's ring, gastritis and gastric polyps   History of colon polyps    Hypercholesterolemia    Hypertension    MAI (mycobacterium avium-intracellulare) infection (HCC)    Osteoporosis    Reflux esophagitis     Past Surgical History:  Procedure Laterality Date   ABDOMINAL HYSTERECTOMY  1998   bladder tact     BREAST EXCISIONAL BIOPSY Bilateral 1970's   benign   BREAST EXCISIONAL BIOPSY Bilateral 2010   pt  stated dr Katrinka Blazing did bilat axilla lumps neg    BREAST SURGERY     cyst(benign)   CATARACT EXTRACTION W/PHACO Left 06/13/2021   Procedure: CATARACT EXTRACTION PHACO AND INTRAOCULAR LENS  PLACEMENT (IOC) LEFT;  Surgeon: Nevada Crane, MD;  Location[facility];  Service: Ophthalmology;  Laterality: Left;  7.04 0:40.5   CATARACT EXTRACTION W/PHACO Right 06/27/2021   Procedure: CATARACT EXTRACTION PHACO AND INTRAOCULAR LENS PLACEMENT (IOC) RIGHT;  Surgeon: Nevada Crane, MD;  Location: [facility];  Service: Ophthalmology;  Laterality: Right;  3.32 0:30.7   COLONOSCOPY N/A 09/28/2014   Procedure: COLONOSCOPY;  Surgeon: Scot Jun, MD;  Location: [facility];  Service: Endoscopy;  Laterality: N/A;    COLONOSCOPY WITH PROPOFOL N/A 01/02/2022   Procedure: COLONOSCOPY WITH PROPOFOL;  Surgeon: Regis Bill, MD;  Location: [facility];  Service: Endoscopy;  Laterality: N/A;   ESOPHAGOGASTRODUODENOSCOPY N/A 09/28/2014   Procedure: ESOPHAGOGASTRODUODENOSCOPY (EGD);  Surgeon: Scot Jun, MD;  Location: [facility];  Service: Endoscopy;  Laterality: N/A;   ESOPHAGOGASTRODUODENOSCOPY (EGD) WITH PROPOFOL N/A 04/26/2016   Procedure: ESOPHAGOGASTRODUODENOSCOPY (EGD) WITH PROPOFOL;  Surgeon: Scot Jun, MD;  Location: [facility];  Service: Endoscopy;  Laterality: N/A;   ESOPHAGOGASTRODUODENOSCOPY (EGD) WITH PROPOFOL N/A 01/02/2022   Procedure: ESOPHAGOGASTRODUODENOSCOPY (EGD) WITH PROPOFOL;  Surgeon: Regis Bill, MD;  Location: [facility] END[facility]COPY;  Service: Endoscopy;  Laterality: N/A;   excision of right axillary lipoma  02/2006   EYE SURGERY     FOOT SURGERY Right    HEMORRHOID SURGERY     HERNIA REPAIR  02/2006   NASAL SEPTUM SURGERY      Patient Active Problem List   Diagnosis Date Noted   Squamous cell carcinoma, arm 06/13/2022   Anemia 02/08/2022   IDA (iron deficiency anemia) 12/05/2021   Radicular pain 09/05/2021   Pain in left foot 08/22/2021   Lumbar radiculopathy 08/22/2021   Dyslipidemia 06/10/2021   History of hysterectomy 06/10/2021   Hypertensive disorder 06/10/2021   Osteoporosis 05/10/2021   Type 2 diabetes mellitus with hyperglycemia (HCC) 11/09/2020   Low back pain 11/04/2020   Mixed incontinence 05/13/2020   Chest pain 12/14/2019   SOB (shortness of breath) 12/14/2019   Nocturia 12/11/2019   Sleeping difficulty 09/08/2018   Hyperglycemia 05/06/2018   Cough 10/10/2017   Left hip pain 07/30/2017   Vaginal pain 04/09/2017   Chest tightness 04/02/2016   Axillary pain 03/22/2016   Meningioma (HCC) 02/21/2015   Dizziness 10/18/2014   Health care maintenance 10/18/2014   Dysphagia 03/23/2014   GERD (gastroesophageal reflux disease)  03/23/2014   Headache 10/05/2013   URI (upper respiratory infection) 10/05/2013   Urinary frequency 09/30/2013   Left ear pain 07/20/2013   Neck pain 03/31/2013   MAI (mycobacterium avium-intracellulare) infection (HCC) 08/31/2012   Essential hypertension, benign 08/31/2012   Hypercholesterolemia 08/31/2012   History of colonic polyps 08/31/2012   Gastritis 08/31/2012   Hx of adenomatous colonic polyps 08/31/2012    Family History  Problem Relation Age of Onset   Heart disease Mother 74       heart attack   Cancer Father        lung   Diabetes Sister    Breast cancer Sister 51   Breast cancer Daughter 83   Stroke Brother    Diabetes Brother     Social History   Tobacco Use   Smoking status: Never    Passive exposure: Never   Smokeless tobacco: Never   Tobacco comments:    Never  Substance Use Topics   Alcohol use: No    Alcohol/week: 0.0 standard drinks of alcohol    No Known Allergies  Current Meds  Medication Sig  aspirin 81 MG EC tablet Take 81 mg by mouth daily. Swallow whole.   calcium carbonate (OSCAL) 1500 (600 Ca) MG TABS tablet Take by mouth 2 (two) times daily with a meal.   cholecalciferol (VITAMIN D3) 25 MCG (1000 UNIT) tablet Take 1,000 Units by mouth daily.   CVS SLOW RELEASE IRON 143 (45 Fe) MG TBCR 1 TABLET DAILY   iron dextran complex in sodium chloride 0.9 % 500 mL Inject into the vein once.   metoprolol succinate (TOPROL-XL) 25 MG 24 hr tablet Take 1 tablet (25 mg total) by mouth 2 (two) times daily.   montelukast (SINGULAIR) 10 MG tablet Take 1 tablet (10 mg total) by mouth at bedtime.   Multiple Vitamin (MULTIVITAMIN WITH MINERALS) TABS Take 1 tablet by mouth daily.   Omega-3 Fatty Acids (FISH OIL) 1000 MG CAPS Take 3,000 mg by mouth daily.   pantoprazole (PROTONIX) 40 MG tablet Take 1 tablet (40 mg total) by mouth 2 (two) times daily before a meal.   rosuvastatin (CRESTOR) 10 MG tablet TAKE 1 TABLET DAILY    Immunization History   Administered Date(s) Administered   Influenza Split 02/07/2013, 03/12/2014   Influenza-Unspecified 03/12/2014, 03/11/2015   PFIZER(Purple Top)SARS-COV-2 Vaccination 05/22/2019, 06/12/2019, 04/22/2020   Pneumococcal Conjugate-13 02/17/2015   Pneumococcal Polysaccharide-23 05/06/2020        Objective:     BP 128/84 (BP Location: Left Arm, Cuff Size: Large)   Pulse 85   Temp (!) 96.9 F (36.1 C)   Ht 5\' 4"  (1.626 m)   Wt 210 lb (95.3 kg)   SpO2 95%   BMI 36.05 kg/m   SpO2: 95 % O2 Device: None (Room air)  GENERAL: Well-developed, obese woman in no acute distress.  Fully ambulatory.  No conversational dyspnea HEAD: Normocephalic, atraumatic. EYES: Pupils equal, round, reactive to light.  No scleral icterus. MOUTH: Partials upper and lower.  Oral mucosa moist.  No thrush. NECK: Supple. No thyromegaly. Trachea midline. No JVD.  No adenopathy. PULMONARY: Good air entry bilaterally.    Coarse breath sounds but otherwise, no adventitious sounds. CARDIOVASCULAR: S1 and S2. Regular rate and rhythm.  No rubs, murmurs or gallops heard. ABDOMEN: Benign MUSCULOSKELETAL: He has significant OA changes in her hands, no clubbing, no edema. NEUROLOGIC: No focal deficits noted.  Fluent speech, no gait disturbance. SKIN: Intact,warm,dry.  Limited exam shows no rashes. PSYCH: Mood and behavior are appropriate.      12/06/2022   10:00 AM  Results of the Epworth flowsheet  Sitting and reading 1  Watching TV 1  Sitting, inactive in a public place (e.g. a theatre or a meeting) 0  As a passenger in a car for an hour without a break 0  Lying down to rest in the afternoon when circumstances permit 1  Sitting and talking to someone 0  Sitting quietly after a lunch without alcohol 0  In a car, while stopped for a few minutes in traffic 0  Total score 3    Patient could not perform the nitric oxide test, could not perform maneuver.   Assessment & Plan:     ICD-10-CM   1. Persistent asthma  without complication, unspecified asthma severity  J45.909 Pulmonary Function Test [facility] Only   Will obtain PFTs Continue Trelegy for now Breztri not indicated for asthma diagnosis Patient unable to use MDIs efficiently    2. SOB (shortness of breath)  R06.02 CT CHEST WO CONTRAST    Pulmonary Function Test [facility] Only   Reassess with  PFTs as above CT chest query parenchymal process    3. Chronic cough  R05.3 CT CHEST WO CONTRAST   Multifactorial    4. Hiatal hernia with GERD  K44.9 CT CHEST WO CONTRAST   K21.9    Has been evaluated by GI Esophageal dysmotility Hiatal hernia Query status    5. BMI 36.0-36.9,adult  Z68.36    This issue adds complexity to her management Would benefit from weight loss      Orders Placed This Encounter  Procedures   CT CHEST WO CONTRAST    Standing Status:   Future    Standing Expiration Date:   12/06/2023    Order Specific Question:   Preferred imaging location?    Answer:   [facility]   Pulmonary Function Test [facility] Only    Standing Status:   Future    Standing Expiration Date:   12/06/2023    Order Specific Question:   Full PFT: includes the following: basic spirometry, spirometry pre & post bronchodilator, diffusion capacity (DLCO), lung volumes    Answer:   Full PFT    Order Specific Question:   This test can only be performed at    Answer:   [facility]    Meds ordered this encounter  Medications   Fluticasone-Umeclidin-Vilant (TRELEGY ELLIPTA) 100-62.5-25 MCG/ACT AEPB    Sig: Inhale 1 puff into the lungs daily.    Order Specific Question:   Lot Number?    Answer:   [id number]    Order Specific Question:   Expiration Date?    Answer:   03/15/2024    Order Specific Question:   Quantity    Answer:   2   Fluticasone-Umeclidin-Vilant (TRELEGY ELLIPTA) 100-62.5-25 MCG/ACT AEPB    Sig: Inhale 1 puff into the lungs daily.    Dispense:  180 each    Refill:  3   Discussion: The patient has had diagnoses of moderate persistent asthma  since 2021, has been well-controlled on Trelegy Ellipta since then.  I have advised the patient to discuss with her health plan provider (she is retired from [employer]) to see if an exception can be made to cover the Trelegy.  She has difficulties in actuating metered-dose inhalers due to significant arthritis in her hands.  Unfortunately Markus Daft is currently not indicated for asthma but Trelegy does carry an indication for asthma.  In addition Markus Daft is a metered-dose inhaler which the patient has difficulties actuating.  For rescue inhalation the patient would benefit from an inhaler such as ProAir Respiclick but currently her insurance plan does not cover it in any form.  This would help her given her issues with arthritis in her hands.  If the Trelegy cannot be covered then the alternative would be to switch the patient to nebulizer medications.  She was however reluctant to switch to these currently.  The patient has been told that she needed a sleep study due to the cough and mucus production she had at nighttime.  She is however not having sleep apnea symptoms per se.  Epworth scale was only 3.  Given that she is having significant reflux particularly at nighttime positive pressure mask could make issues with aspiration worse.  Since there is low suspicion for sleep apnea at this point will hold off on testing for this.  Will repeat PFTs to reevaluate respiratory status.  Because of her worsening issues with dyspnea and cough will obtain CT chest to evaluate for potential parenchymal process.  With regards to reflux  I have recommended that she revisit with GI.  Will see the patient in follow-up in 6 to 8 weeks time call sooner should any new problems arise.   Gailen Shelter, MD Advanced Bronchoscopy PCCM [facility]    *This note was dictated using voice recognition software/Dragon.  Despite best efforts to proofread, errors can occur which can change the meaning. Any  transcriptional errors that result from this process are unintentional and may not be fully corrected at the time of dictation.

## 2022-12-08 NOTE — Telephone Encounter (Signed)
FYI - appears she is staying on trelegy.

## 2022-12-08 NOTE — Telephone Encounter (Signed)
Received voicemail from patient. She would prefer to stay on Trelegy and continue to pay copay rather than switch to Trelegy. We can stop patient assistance process. She was able to get samples from Dr. Jayme Cloud.   Thanks, all!

## 2022-12-09 ENCOUNTER — Encounter: Payer: Self-pay | Admitting: Internal Medicine

## 2022-12-09 DIAGNOSIS — J45909 Unspecified asthma, uncomplicated: Secondary | ICD-10-CM | POA: Insufficient documentation

## 2022-12-11 ENCOUNTER — Ambulatory Visit
Admission: RE | Admit: 2022-12-11 | Discharge: 2022-12-11 | Disposition: A | Discharge status: Home / Self Care | Payer: Medicare Other | Source: Ambulatory Visit | Attending: Internal Medicine | Admitting: Internal Medicine

## 2022-12-11 DIAGNOSIS — K449 Diaphragmatic hernia without obstruction or gangrene: Secondary | ICD-10-CM | POA: Diagnosis present

## 2022-12-11 DIAGNOSIS — R053 Chronic cough: Secondary | ICD-10-CM | POA: Diagnosis present

## 2022-12-11 DIAGNOSIS — K219 Gastro-esophageal reflux disease without esophagitis: Secondary | ICD-10-CM | POA: Insufficient documentation

## 2022-12-11 DIAGNOSIS — R0602 Shortness of breath: Secondary | ICD-10-CM | POA: Insufficient documentation

## 2022-12-12 ENCOUNTER — Ambulatory Visit (INDEPENDENT_AMBULATORY_CARE_PROVIDER_SITE_OTHER): Payer: Medicare Other

## 2022-12-12 DIAGNOSIS — M810 Age-related osteoporosis without current pathological fracture: Secondary | ICD-10-CM

## 2022-12-12 MED ORDER — DENOSUMAB 60 MG/ML ~~LOC~~ SOSY
60.0000 mg | PREFILLED_SYRINGE | Freq: Once | SUBCUTANEOUS | Status: AC
  Administered 2022-12-12: 60 mg via SUBCUTANEOUS

## 2022-12-12 NOTE — Progress Notes (Signed)
Patient arrived for a Prolia injection and it was administered into her right arm SQ. Patient tolerated the injection well and did not show any signs of distress or voice any concerns.

## 2022-12-19 ENCOUNTER — Telehealth: Payer: Self-pay | Admitting: Pulmonary Disease

## 2022-12-19 ENCOUNTER — Ambulatory Visit: Payer: Medicare Other | Attending: Pulmonary Disease

## 2022-12-19 DIAGNOSIS — R0602 Shortness of breath: Secondary | ICD-10-CM | POA: Diagnosis not present

## 2022-12-19 DIAGNOSIS — J45909 Unspecified asthma, uncomplicated: Secondary | ICD-10-CM

## 2022-12-19 MED ORDER — ALBUTEROL SULFATE (2.5 MG/3ML) 0.083% IN NEBU
2.5000 mg | INHALATION_SOLUTION | Freq: Once | RESPIRATORY_TRACT | Status: AC
  Administered 2022-12-19: 2.5 mg via RESPIRATORY_TRACT
  Filled 2022-12-19: qty 3

## 2022-12-19 NOTE — Telephone Encounter (Signed)
Patient returning call to go over CT scan results. Please call back.

## 2022-12-19 NOTE — Telephone Encounter (Signed)
Patient has been notified of her CT results.  Nothing further needed.

## 2023-01-08 ENCOUNTER — Encounter: Payer: Self-pay | Admitting: Urology

## 2023-01-08 ENCOUNTER — Ambulatory Visit (INDEPENDENT_AMBULATORY_CARE_PROVIDER_SITE_OTHER): Payer: Medicare Other | Admitting: Urology

## 2023-01-08 VITALS — BP 167/73 | HR 91

## 2023-01-08 DIAGNOSIS — N3946 Mixed incontinence: Secondary | ICD-10-CM

## 2023-01-08 LAB — MICROSCOPIC EXAMINATION: WBC, UA: >30 /hpf — AB (ref 0–5)

## 2023-01-08 LAB — URINALYSIS, COMPLETE
Bilirubin, UA: NEGATIVE
Glucose, UA: NEGATIVE
Ketones, UA: NEGATIVE
Nitrite, UA: NEGATIVE
Protein,UA: NEGATIVE
RBC, UA: NEGATIVE
Specific Gravity, UA: 1.025 (ref 1.005–1.030)
Urobilinogen, Ur: 0.2 mg/dL (ref 0.2–1.0)
pH, UA: 5.5 (ref 5.0–7.5)

## 2023-01-08 NOTE — Patient Instructions (Signed)

## 2023-01-08 NOTE — Addendum Note (Signed)
Addended by: Sueanne Margarita on: 01/08/2023 10:08 AM   Modules accepted: Orders

## 2023-01-08 NOTE — Progress Notes (Signed)
01/08/2023 9:32 AM   Angela Cohen 07/19/1940 [id number]  Referring provider: Dale [place], MD [address] [place],  [place] [address]  Chief Complaint  Patient presents with   Follow-up   Urinary Incontinence    HPI: Reviewed chart Discussed 3 refractory treatments for mixed incontinence 1 year ago including bulking agents.  I did not give her the new beta 3 agonist due to side effects from Myrbetriq   Still has refractory urgency incontinence.  We talked about InterStim and percutaneous tibial nerve stimulation in detail with her asking  She has been having some upper tract GI bleeding.  She fell and had a concussion.  She has had some epidural and other back issues.  Start percutaneous tibial nerve stimulation.  Can always consider InterStim in the future.  She is on blood thinners.  PTNS a good option based on age and comorbidities  Today When I first saw the patient in 2022 she had mixed stress and urge incontinence.  Both are significant.  She was soaking 4 pads a day with high-volume enuresis.  She was voiding every 30 to 60 minutes again at 3-4 times a night.  She had failed Ditropan and Myrbetriq.  She had mixed incontinence on urodynamics.  I thought she likely may have persistent bedwetting and OAB symptoms if he had a bulking agent.  We have talked about refractory treatments twice.  We have talked about bulking agent in the past.  She has never had the new beta 3 agonist due to side effects from [facility]  Patient has high-volume soaking bedwetting.  High-volume urge incontinence.  Soaks 10 pads a day.  Still has moderately severe stress incontinence.  I went over percutaneous tibial nerve stimulation and InterStim with full templates.  Lengthy conversation became somewhat circular.  I think she understands both treatments well and I think her daughter had InterStim.           PMH: Past Medical History:  Diagnosis Date   Asthma     Fibrocystic breast disease    Gastritis    EGD - schatzki's ring, gastritis and gastric polyps   History of colon polyps    Hypercholesterolemia    Hypertension    MAI (mycobacterium avium-intracellulare) infection (HCC)    Osteoporosis    Reflux esophagitis     Surgical History: Past Surgical History:  Procedure Laterality Date   ABDOMINAL HYSTERECTOMY  1998   bladder tact     BREAST EXCISIONAL BIOPSY Bilateral 1970's   benign   BREAST EXCISIONAL BIOPSY Bilateral 2010   pt  stated dr Katrinka Blazing did bilat axilla lumps neg    BREAST SURGERY     cyst(benign)   CATARACT EXTRACTION W/PHACO Left 06/13/2021   Procedure: CATARACT EXTRACTION PHACO AND INTRAOCULAR LENS PLACEMENT (IOC) LEFT;  Surgeon: Nevada Crane, MD;  Location: [facility];  Service: Ophthalmology;  Laterality: Left;  7.04 0:40.5   CATARACT EXTRACTION W/PHACO Right 06/27/2021   Procedure: CATARACT EXTRACTION PHACO AND INTRAOCULAR LENS PLACEMENT (IOC) RIGHT;  Surgeon: Nevada Crane, MD;  Location: [facility];  Service: Ophthalmology;  Laterality: Right;  3.32 0:30.7   COLONOSCOPY N/A 09/28/2014   Procedure: COLONOSCOPY;  Surgeon: Scot Jun, MD;  Location: [facility] ENDOSCOPY;  Service: Endoscopy;  Laterality: N/A;   COLONOSCOPY WITH PROPOFOL N/A 01/02/2022   Procedure: COLONOSCOPY WITH PROPOFOL;  Surgeon: Regis Bill, MD;  Location: [facility] ENDOSCOPY;  Service: Endoscopy;  Laterality: N/A;   ESOPHAGOGASTRODUODENOSCOPY N/A 09/28/2014  Procedure: ESOPHAGOGASTRODUODENOSCOPY (EGD);  Surgeon: Scot Jun, MD;  Location: [facility] ENDOSCOPY;  Service: Endoscopy;  Laterality: N/A;   ESOPHAGOGASTRODUODENOSCOPY (EGD) WITH PROPOFOL N/A 04/26/2016   Procedure: ESOPHAGOGASTRODUODENOSCOPY (EGD) WITH PROPOFOL;  Surgeon: Scot Jun, MD;  Location: [facility] END[facility]COPY;  Service: Endoscopy;  Laterality: N/A;   ESOPHAGOGASTRODUODENOSCOPY (EGD) WITH PROPOFOL N/A 01/02/2022   Procedure:  ESOPHAGOGASTRODUODENOSCOPY (EGD) WITH PROPOFOL;  Surgeon: Regis Bill, MD;  Location: [facility] ENDOSCOPY;  Service: Endoscopy;  Laterality: N/A;   excision of right axillary lipoma  02/2006   EYE SURGERY     FOOT SURGERY Right    HEMORRHOID SURGERY     HERNIA REPAIR  02/2006   NASAL SEPTUM SURGERY      Home Medications:  Allergies as of 01/08/2023   No Known Allergies      Medication List        Accurate as of January 08, 2023  9:32 AM. If you have any questions, ask your nurse or doctor.          albuterol 108 (90 Base) MCG/ACT inhaler Commonly known as: VENTOLIN HFA Inhale 2 puffs into the lungs every 6 (six) hours as needed for wheezing or shortness of breath.   aspirin EC 81 MG tablet Take 81 mg by mouth daily. Swallow whole.   calcium carbonate 1500 (600 Ca) MG Tabs tablet Commonly known as: OSCAL Take by mouth 2 (two) times daily with a meal.   cholecalciferol 25 MCG (1000 UNIT) tablet Commonly known as: VITAMIN D3 Take 1,000 Units by mouth daily.   CVS Slow Release Iron 143 (45 Fe) MG Tbcr Generic drug: Ferrous Sulfate 1 TABLET DAILY   Fish Oil 1000 MG Caps Take 3,000 mg by mouth daily.   iron dextran complex in sodium chloride 0.9 % 500 mL Inject into the vein once.   metoprolol succinate 25 MG 24 hr tablet Commonly known as: TOPROL-XL Take 1 tablet (25 mg total) by mouth 2 (two) times daily.   montelukast 10 MG tablet Commonly known as: SINGULAIR Take 1 tablet (10 mg total) by mouth at bedtime.   multivitamin with minerals Tabs tablet Take 1 tablet by mouth daily.   pantoprazole 40 MG tablet Commonly known as: PROTONIX Take 1 tablet (40 mg total) by mouth 2 (two) times daily before a meal.   rosuvastatin 10 MG tablet Commonly known as: CRESTOR TAKE 1 TABLET DAILY   Trelegy Ellipta 100-62.5-25 MCG/ACT Aepb Generic drug: Fluticasone-Umeclidin-Vilant Inhale 1 puff into the lungs daily.   Trelegy Ellipta 100-62.5-25 MCG/ACT Aepb Generic  drug: Fluticasone-Umeclidin-Vilant Inhale 1 puff into the lungs daily.        Allergies: No Known Allergies  Family History: Family History  Problem Relation Age of Onset   Heart disease Mother 4       heart attack   Cancer Father        lung   Diabetes Sister    Breast cancer Sister 37   Breast cancer Daughter 53   Stroke Brother    Diabetes Brother     Social History:  reports that she has never smoked. She has never been exposed to tobacco smoke. She has never used smokeless tobacco. She reports that she does not drink alcohol and does not use drugs.  ROS:  Physical Exam: BP (!) 167/73   Pulse 91   Constitutional:  Alert and oriented, No acute distress. HEENT: [place] AT, moist mucus membranes.  Trachea midline, no masses.   Laboratory Data: Lab Results  Component Value Date   WBC 5.1 08/08/2022   HGB 12.9 08/08/2022   HCT 40.4 08/08/2022   MCV 94.0 08/08/2022   PLT 251 08/08/2022    Lab Results  Component Value Date   CREATININE 0.86 10/30/2022    No results found for: "PSA"  No results found for: "TESTOSTERONE"  Lab Results  Component Value Date   HGBA1C 6.5 10/30/2022    Urinalysis    Component Value Date/Time   COLORURINE YELLOW 12/11/2019 1138   APPEARANCEUR Clear 01/09/2022 1018   LABSPEC 1.025 12/11/2019 1138   PHURINE 6.0 12/11/2019 1138   GLUCOSEU Negative 01/09/2022 1018   GLUCOSEU NEGATIVE 12/11/2019 1138   HGBUR NEGATIVE 12/11/2019 1138   BILIRUBINUR Negative 01/09/2022 1018   KETONESUR NEGATIVE 12/11/2019 1138   PROTEINUR Negative 01/09/2022 1018   UROBILINOGEN 0.2 12/11/2019 1138   NITRITE Negative 01/09/2022 1018   NITRITE NEGATIVE 12/11/2019 1138   LEUKOCYTESUR Negative 01/09/2022 1018   LEUKOCYTESUR NEGATIVE 12/11/2019 1138    Pertinent Imaging:   Assessment & Plan: Because of comorbidities age and convenience she has decided to do percutaneous tibial nerve  stimulation.  If this fails I think she is going to do InterStim.  She understands I do the test in [place] but I can do the implant here locally.  We will proceed accordingly.  Call if culture positive  There are no diagnoses linked to this encounter.  No follow-ups on file.  Martina Sinner, MD  [facility] [address] [place], [place] [address] [phone]

## 2023-01-12 ENCOUNTER — Telehealth: Payer: Self-pay

## 2023-01-12 NOTE — Telephone Encounter (Signed)
Received message that patient would like to r/s PTNS treatment appointments, last time she was not able to start this due to health conditions.  NO PA is needed. Patient advised and scheduled.

## 2023-01-29 ENCOUNTER — Telehealth: Payer: Self-pay | Admitting: Internal Medicine

## 2023-01-29 NOTE — Telephone Encounter (Signed)
Per message, Angela Cohen is requesting to take two tablets bid.  I would not recommend this dose of medication.  Need to confirm what dose she was asking for and need to confirm what symptoms she is currently having.

## 2023-01-29 NOTE — Telephone Encounter (Signed)
Pantoprazole 20mg 

## 2023-01-29 NOTE — Addendum Note (Signed)
Addended by: Charm Barges on: 01/29/2023 08:33 PM   Modules accepted: Orders

## 2023-01-29 NOTE — Telephone Encounter (Signed)
Pt requesting to take medication to 2 in the morning and 2 at night. Current script is pended

## 2023-01-29 NOTE — Telephone Encounter (Signed)
Lvm for pt to give [place] a call back to verify which medication she needed refilled

## 2023-01-29 NOTE — Telephone Encounter (Signed)
Prescription Request  01/29/2023  LOV: 10/30/2022  What is the name of the medication or equipment? Trazole 90 day supply, please change to 2 in morning and 2 at night. Patient states this is working for her.  Have you contacted your pharmacy to request a refill? Yes   Which pharmacy would you like this sent to?    [facility] - [place], [place] - [address] [place] [address] Phone: [phone] Fax: [phone]    Patient notified that their request is being sent to the clinical staff for review and that they should receive a response within 2 business days.   Please advise at [facility] [phone]

## 2023-01-29 NOTE — Addendum Note (Signed)
Addended by: Kristie Cowman on: 01/29/2023 04:11 PM   Modules accepted: Orders

## 2023-01-30 ENCOUNTER — Telehealth: Payer: Self-pay

## 2023-01-30 ENCOUNTER — Encounter: Payer: Self-pay | Admitting: Pulmonary Disease

## 2023-01-30 ENCOUNTER — Ambulatory Visit: Payer: Medicare Other | Admitting: Pulmonary Disease

## 2023-01-30 VITALS — BP 118/80 | HR 82 | Temp 97.8°F | Ht 64.0 in | Wt 209.2 lb

## 2023-01-30 DIAGNOSIS — J45909 Unspecified asthma, uncomplicated: Secondary | ICD-10-CM

## 2023-01-30 DIAGNOSIS — R053 Chronic cough: Secondary | ICD-10-CM

## 2023-01-30 DIAGNOSIS — R0602 Shortness of breath: Secondary | ICD-10-CM

## 2023-01-30 DIAGNOSIS — K449 Diaphragmatic hernia without obstruction or gangrene: Secondary | ICD-10-CM | POA: Diagnosis not present

## 2023-01-30 DIAGNOSIS — Z6836 Body mass index (BMI) 36.0-36.9, adult: Secondary | ICD-10-CM

## 2023-01-30 DIAGNOSIS — J4540 Moderate persistent asthma, uncomplicated: Secondary | ICD-10-CM

## 2023-01-30 DIAGNOSIS — K219 Gastro-esophageal reflux disease without esophagitis: Secondary | ICD-10-CM

## 2023-01-30 NOTE — Patient Instructions (Signed)
Continue your Trelegy as you are doing.  We have provided you with a form to see if you can get assistance from the maker of the Trelegy.  We will see him in follow-up in 3 months time call sooner should any new problems arise.

## 2023-01-30 NOTE — Progress Notes (Incomplete)
Subjective:    [name], female    DOB: 1940/10/06, 82 y.o.   MRN: [id number]  Patient Care Team: Dale Parmer, MD as PCP - General (Internal Medicine) [name], MD as PCP - Cardiology (Cardiology)  Chief Complaint  Patient presents with  . Follow-up    Breathing is fine. NO SOB, wheezing or cough.     HPI        DATA 02/24/2020 2D echo: LVEF 60 to 65%, no wall motion abnormalities, grade 1 DD, normal pulmonary artery pressure. No valve disease noted. 03/03/2020 PFT's: FEV1 1.18 L or 58% predicted, FVC 1.61 L or 59% predicted, lung volumes normal.  Diffusion capacity normal.  Small airways obstructive disease with restrictive physiology likely due to obesity. 12/11/2022 CT chest: Very large hiatal hernia otherwise no significant findings on CT chest. 12/19/2022 PFTs: FEV1 1.06 L or 54% predicted, FVC 1.39 L or 53% predicted, minimal restriction likely due to obesity (ERV 29%) diffusion capacity reduced.  Minimal obstructive airways disease with reversible component mild restriction likely on the basis of obesity.  Diffusion defect may be also related to obesity.  Review of Systems A 10 point review of systems was performed and it is as noted above otherwise negative.   Patient Active Problem List   Diagnosis Date Noted  . Asthma 12/09/2022  . Squamous cell carcinoma, arm 06/13/2022  . Anemia 02/08/2022  . IDA (iron deficiency anemia) 12/05/2021  . Radicular pain 09/05/2021  . Pain in left foot 08/22/2021  . Lumbar radiculopathy 08/22/2021  . Dyslipidemia 06/10/2021  . History of hysterectomy 06/10/2021  . Hypertensive disorder 06/10/2021  . Osteoporosis 05/10/2021  . Type 2 diabetes mellitus with hyperglycemia (HCC) 11/09/2020  . Low back pain 11/04/2020  . Mixed incontinence 05/13/2020  . Chest pain 12/14/2019  . SOB (shortness of breath) 12/14/2019  . Nocturia 12/11/2019  . Sleeping difficulty 09/08/2018  . Hyperglycemia 05/06/2018   . Cough 10/10/2017  . Left hip pain 07/30/2017  . Vaginal pain 04/09/2017  . Chest tightness 04/02/2016  . Axillary pain 03/22/2016  . Meningioma (HCC) 02/21/2015  . Dizziness 10/18/2014  . Health care maintenance 10/18/2014  . Dysphagia 03/23/2014  . GERD (gastroesophageal reflux disease) 03/23/2014  . Headache 10/05/2013  . URI (upper respiratory infection) 10/05/2013  . Urinary frequency 09/30/2013  . Left ear pain 07/20/2013  . Neck pain 03/31/2013  . MAI (mycobacterium avium-intracellulare) infection (HCC) 08/31/2012  . Essential hypertension, benign 08/31/2012  . Hypercholesterolemia 08/31/2012  . History of colonic polyps 08/31/2012  . Gastritis 08/31/2012  . Hx of adenomatous colonic polyps 08/31/2012    Social History   Tobacco Use  . Smoking status: Never    Passive exposure: Never  . Smokeless tobacco: Never  . Tobacco comments:    Never  Substance Use Topics  . Alcohol use: No    Alcohol/week: 0.0 standard drinks of alcohol    No Known Allergies  Current Meds  Medication Sig  . albuterol (VENTOLIN HFA) 108 (90 Base) MCG/ACT inhaler Inhale 2 puffs into the lungs every 6 (six) hours as needed for wheezing or shortness of breath.  Marland Kitchen aspirin 81 MG EC tablet Take 81 mg by mouth daily. Swallow whole.  . calcium carbonate (OSCAL) 1500 (600 Ca) MG TABS tablet Take by mouth 2 (two) times daily with a meal.  . cholecalciferol (VITAMIN D3) 25 MCG (1000 UNIT) tablet Take 1,000 Units by mouth daily.  . CVS SLOW RELEASE IRON 143 (45 Fe) MG  TBCR 1 TABLET DAILY  . Fluticasone-Umeclidin-Vilant (TRELEGY ELLIPTA) 100-62.5-25 MCG/ACT AEPB Inhale 1 puff into the lungs daily.  . iron dextran complex in sodium chloride 0.9 % 500 mL Inject into the vein once.  . metoprolol succinate (TOPROL-XL) 25 MG 24 hr tablet Take 1 tablet (25 mg total) by mouth 2 (two) times daily.  . montelukast (SINGULAIR) 10 MG tablet Take 1 tablet (10 mg total) by mouth at bedtime.  . Multiple Vitamin  (MULTIVITAMIN WITH MINERALS) TABS Take 1 tablet by mouth daily.  . Omega-3 Fatty Acids (FISH OIL) 1000 MG CAPS Take 3,000 mg by mouth daily.  . pantoprazole (PROTONIX) 40 MG tablet Take 1 tablet (40 mg total) by mouth 2 (two) times daily before a meal.  . rosuvastatin (CRESTOR) 10 MG tablet TAKE 1 TABLET DAILY    Immunization History  Administered Date(s) Administered  . Influenza Split 02/07/2013, 03/12/2014  . Influenza-Unspecified 03/12/2014, 03/11/2015  . PFIZER(Purple Top)SARS-COV-2 Vaccination 05/22/2019, 06/12/2019, 04/22/2020  . Pneumococcal Conjugate-13 02/17/2015  . Pneumococcal Polysaccharide-23 05/06/2020        Objective:     BP 118/80 (BP Location: Right Arm, Cuff Size: Normal)   Pulse 82   Temp 97.8 F (36.6 C)   Ht 5\' 4"  (1.626 m)   Wt 209 lb 3.2 oz (94.9 kg)   SpO2 95%   BMI 35.91 kg/m   SpO2: 95 % O2 Device: None (Room air)  GENERAL: Well-developed, obese woman in no acute distress.  Fully ambulatory.  No conversational dyspnea HEAD: Normocephalic, atraumatic. EYES: Pupils equal, round, reactive to light.  No scleral icterus. MOUTH: Partials upper and lower.  Oral mucosa moist.  No thrush. NECK: Supple. No thyromegaly. Trachea midline. No JVD.  No adenopathy. PULMONARY: Good air entry bilaterally.    Coarse breath sounds but otherwise, no adventitious sounds. CARDIOVASCULAR: S1 and S2. Regular rate and rhythm.  No rubs, murmurs or gallops heard. ABDOMEN: Benign MUSCULOSKELETAL: He has significant OA changes in her hands, no clubbing, no edema. NEUROLOGIC: No focal deficits noted.  Fluent speech, no gait disturbance. SKIN: Intact,warm,dry.  Limited exam shows no rashes. PSYCH: Mood and behavior are appropriate.        Assessment & Plan:     ICD-10-CM   1. Persistent asthma without complication, unspecified asthma severity  J45.909     2. SOB (shortness of breath)  R06.02     3. Chronic cough  R05.3     4. Hiatal hernia with GERD  K44.9     K21.9     5. BMI 36.0-36.9,adult  Z68.36       No orders of the defined types were placed in this encounter.   No orders of the defined types were placed in this encounter.     Gailen Shelter, MD Advanced Bronchoscopy PCCM [facility]    *This note was dictated using voice recognition software/Dragon.  Despite best efforts to proofread, errors can occur which can change the meaning. Any transcriptional errors that result from this process are unintentional and may not be fully corrected at the time of dictation.

## 2023-01-30 NOTE — Progress Notes (Signed)
Subjective:    [name], female    DOB: 11-06-1940, 82 y.o.   MRN: [id number]  Patient Care Team: Dale Temperance, MD as PCP - General (Internal Medicine) [name], MD as PCP - Cardiology (Cardiology)  Chief Complaint  Patient presents with   Follow-up    Breathing is fine. NO SOB, wheezing or cough.     HPI Angela Cohen is an 82 year old lifelong never smoker with known moderate persistent asthma and medical issues as noted below, who presents for follow-up From her most recent visit on 06 December 2022.  The patient has had issues with getting Trelegy Ellipta covered by her insurance at a reasonable co-pay.  She notes that Trelegy Ellipta is the medication that has worked the best for control of her moderate persistent asthma.Since that visit she has not had any fevers, chills or sweats. She has not had any wheezing or cough. She has severe issues with gastroesophageal reflux she has been noted to have esophageal dysmotility and a hiatal hernia that is at least 5 cm in size. She had PPI increased in dose and frequency and this has resolved this issue.  She does not endorse any issues with snoring, no issues with non restorative sleep.  Feels refreshed in the mornings when she arises.  No daytime somnolence. PFTs showed mild decline in function. CT chest showed no ILD, very large hiatal hernia with intrathoracic stomach.  The patient has been well compensated on Trelegy since at least September 2021.  She had at that time being evaluated by Dr. Graylin Shiver at [facility].  She was also started on montelukast at that time.   DATA 02/24/2020 2D echo: LVEF 60 to 65%, no wall motion abnormalities, grade 1 DD, normal pulmonary artery pressure. No valve disease noted. 03/03/2020 PFT's: FEV1 1.18 L or 58% predicted, FVC 1.61 L or 59% predicted, lung volumes normal.  Diffusion capacity normal.  Small airways obstructive disease with restrictive physiology likely due to  obesity. 12/11/2022 CT chest: Very large hiatal hernia otherwise no significant findings on CT chest. 12/19/2022 PFTs: FEV1 1.06 L or 54% predicted, FVC 1.39 L or 53% predicted, minimal restriction likely due to obesity (ERV 29%) diffusion capacity reduced.  Minimal obstructive airways disease with reversible component mild restriction likely on the basis of obesity.  Diffusion defect may be also related to obesity.  Review of Systems A 10 point review of systems was performed and it is as noted above otherwise negative.   Patient Active Problem List   Diagnosis Date Noted   Asthma 12/09/2022   Squamous cell carcinoma, arm 06/13/2022   Anemia 02/08/2022   IDA (iron deficiency anemia) 12/05/2021   Radicular pain 09/05/2021   Pain in left foot 08/22/2021   Lumbar radiculopathy 08/22/2021   Dyslipidemia 06/10/2021   History of hysterectomy 06/10/2021   Hypertensive disorder 06/10/2021   Osteoporosis 05/10/2021   Type 2 diabetes mellitus with hyperglycemia (HCC) 11/09/2020   Low back pain 11/04/2020   Mixed incontinence 05/13/2020   Chest pain 12/14/2019   SOB (shortness of breath) 12/14/2019   Nocturia 12/11/2019   Sleeping difficulty 09/08/2018   Hyperglycemia 05/06/2018   Cough 10/10/2017   Left hip pain 07/30/2017   Vaginal pain 04/09/2017   Chest tightness 04/02/2016   Axillary pain 03/22/2016   Meningioma (HCC) 02/21/2015   Dizziness 10/18/2014   Health care maintenance 10/18/2014   Dysphagia 03/23/2014   GERD (gastroesophageal reflux disease) 03/23/2014   Headache 10/05/2013   URI (  upper respiratory infection) 10/05/2013   Urinary frequency 09/30/2013   Left ear pain 07/20/2013   Neck pain 03/31/2013   MAI (mycobacterium avium-intracellulare) infection (HCC) 08/31/2012   Essential hypertension, benign 08/31/2012   Hypercholesterolemia 08/31/2012   History of colonic polyps 08/31/2012   Gastritis 08/31/2012   Hx of adenomatous colonic polyps 08/31/2012    Social  History   Tobacco Use   Smoking status: Never    Passive exposure: Never   Smokeless tobacco: Never   Tobacco comments:    Never  Substance Use Topics   Alcohol use: No    Alcohol/week: 0.0 standard drinks of alcohol    No Known Allergies  Current Meds  Medication Sig   albuterol (VENTOLIN HFA) 108 (90 Base) MCG/ACT inhaler Inhale 2 puffs into the lungs every 6 (six) hours as needed for wheezing or shortness of breath.   aspirin 81 MG EC tablet Take 81 mg by mouth daily. Swallow whole.   calcium carbonate (OSCAL) 1500 (600 Ca) MG TABS tablet Take by mouth 2 (two) times daily with a meal.   cholecalciferol (VITAMIN D3) 25 MCG (1000 UNIT) tablet Take 1,000 Units by mouth daily.   [facility] SLOW RELEASE IRON 143 (45 Fe) MG TBCR 1 TABLET DAILY   Fluticasone-Umeclidin-Vilant (TRELEGY ELLIPTA) 100-62.5-25 MCG/ACT AEPB Inhale 1 puff into the lungs daily.   iron dextran complex in sodium chloride 0.9 % 500 mL Inject into the vein once.   Multiple Vitamin (MULTIVITAMIN WITH MINERALS) TABS Take 1 tablet by mouth daily.   Omega-3 Fatty Acids (FISH OIL) 1000 MG CAPS Take 3,000 mg by mouth daily.   rosuvastatin (CRESTOR) 10 MG tablet TAKE 1 TABLET DAILY   [DISCONTINUED] metoprolol succinate (TOPROL-XL) 25 MG 24 hr tablet Take 1 tablet (25 mg total) by mouth 2 (two) times daily.   [DISCONTINUED] montelukast (SINGULAIR) 10 MG tablet Take 1 tablet (10 mg total) by mouth at bedtime.   [DISCONTINUED] pantoprazole (PROTONIX) 40 MG tablet Take 1 tablet (40 mg total) by mouth 2 (two) times daily before a meal.    Immunization History  Administered Date(s) Administered   Influenza Split 02/07/2013, 03/12/2014   Influenza-Unspecified 03/12/2014, 03/11/2015   PFIZER(Purple Top)SARS-COV-2 Vaccination 05/22/2019, 06/12/2019, 04/22/2020   Pneumococcal Conjugate-13 02/17/2015   Pneumococcal Polysaccharide-23 05/06/2020      Objective:   BP 118/80 (BP Location: Right Arm, Cuff Size: Normal)   Pulse 82    Temp 97.8 F (36.6 C)   Ht 5\' 4"  (1.626 m)   Wt 209 lb 3.2 oz (94.9 kg)   SpO2 95%   BMI 35.91 kg/m   SpO2: 95 % O2 Device: None (Room air)  GENERAL: Well-developed, obese woman in no acute distress.  Fully ambulatory.  No conversational dyspnea HEAD: Normocephalic, atraumatic. EYES: Pupils equal, round, reactive to light.  No scleral icterus. MOUTH: Partials upper and lower.  Oral mucosa moist.  No thrush. NECK: Supple. No thyromegaly. Trachea midline. No JVD.  No adenopathy. PULMONARY: Good air entry bilaterally.    Coarse breath sounds but otherwise, no adventitious sounds. CARDIOVASCULAR: S1 and S2. Regular rate and rhythm.  No rubs, murmurs or gallops heard. ABDOMEN: Benign MUSCULOSKELETAL: He has significant OA changes in her hands, no clubbing, no edema. NEUROLOGIC: No focal deficits noted.  Fluent speech, no gait disturbance. SKIN: Intact,warm,dry.  Limited exam shows no rashes. PSYCH: Mood and behavior are appropriate.        Assessment & Plan:     ICD-10-CM   1. Persistent asthma without complication,  unspecified asthma severity  J45.909    Mild to moderate Continue Trelegy Continue as needed albuterol    2. SOB (shortness of breath)  R06.02    Multifactorial: Obesity/deconditioning LARGE hiatal hernia Asthma    3. Chronic cough  R05.3    Improved with GERD management Continue antireflux measures Continue PPI (increased by GI)    4. Hiatal hernia with GERD  K44.9    K21.9    Large hiatal hernia with intrathoracic stomach Cannot be repaired per GI Continue antireflux measures Continue PPI    5. BMI 36.0-36.9,adult  Z68.36    This issue adds complexity to her management Recommend weight loss      Will see the patient in 3 months time she is to contact us prior to that time if any new issues arise.   Gailen Shelter, MD Advanced Bronchoscopy PCCM  [facility]    *This note was dictated using voice recognition  software/Dragon.  Despite best efforts to proofread, errors can occur which can change the meaning. Any transcriptional errors that result from this process are unintentional and may not be fully corrected at the time of dictation.

## 2023-01-30 NOTE — Telephone Encounter (Signed)
Patient given [employer] patient assistance forms. She will complete and bring back to the office.

## 2023-02-01 ENCOUNTER — Other Ambulatory Visit: Payer: Self-pay

## 2023-02-01 MED ORDER — PANTOPRAZOLE SODIUM 40 MG PO TBEC: 40.0000 mg | DELAYED_RELEASE_TABLET | Freq: Two times a day (BID) | ORAL | 1 refills | Status: DC

## 2023-02-01 MED ORDER — METOPROLOL SUCCINATE ER 25 MG PO TB24: 25.0000 mg | ORAL_TABLET | Freq: Two times a day (BID) | ORAL | 1 refills | Status: DC

## 2023-02-01 MED ORDER — MONTELUKAST SODIUM 10 MG PO TABS: 10.0000 mg | ORAL_TABLET | Freq: Every day | ORAL | 3 refills | Status: DC

## 2023-02-01 NOTE — Telephone Encounter (Signed)
Called patient to clarify dose. She is taking protonix 20 mg- 2 tablets bid. I have sent in rx for protonix 40 mg bid. Patient is aware.

## 2023-02-01 NOTE — Telephone Encounter (Signed)
Pt called stating her medication pantoprazole 20mg  [facility]

## 2023-02-08 NOTE — Telephone Encounter (Signed)
Lm x1 for patient.  

## 2023-02-08 NOTE — Telephone Encounter (Signed)
Patient is returning phone call. Patient phone number is [phone].

## 2023-02-08 NOTE — Telephone Encounter (Signed)
Spoke to patient for update.  She is planning to bring forms by our office next week.

## 2023-02-13 ENCOUNTER — Inpatient Hospital Stay: Payer: Medicare Other | Attending: Oncology

## 2023-02-13 DIAGNOSIS — D509 Iron deficiency anemia, unspecified: Secondary | ICD-10-CM | POA: Insufficient documentation

## 2023-02-13 DIAGNOSIS — D508 Other iron deficiency anemias: Secondary | ICD-10-CM

## 2023-02-13 LAB — CBC WITH DIFFERENTIAL (CANCER CENTER ONLY)
Abs Immature Granulocytes: 0.02 10*3/uL (ref 0.00–0.07)
Basophils Absolute: 0.1 10*3/uL (ref 0.0–0.1)
Basophils Relative: 1 %
Eosinophils Absolute: 0.3 10*3/uL (ref 0.0–0.5)
Eosinophils Relative: 6 %
HCT: 39.9 % (ref 36.0–46.0)
Hemoglobin: 13.3 g/dL (ref 12.0–15.0)
Immature Granulocytes: 0 %
Lymphocytes Relative: 25 %
Lymphs Abs: 1.3 10*3/uL (ref 0.7–4.0)
MCH: 32.6 pg (ref 26.0–34.0)
MCHC: 33.3 g/dL (ref 30.0–36.0)
MCV: 97.8 fL (ref 80.0–100.0)
Monocytes Absolute: 0.5 10*3/uL (ref 0.1–1.0)
Monocytes Relative: 10 %
Neutro Abs: 2.9 10*3/uL (ref 1.7–7.7)
Neutrophils Relative %: 58 %
Platelet Count: 217 10*3/uL (ref 150–400)
RBC: 4.08 MIL/uL (ref 3.87–5.11)
RDW: 11.9 % (ref 11.5–15.5)
WBC Count: 5.1 10*3/uL (ref 4.0–10.5)
nRBC: 0 % (ref 0.0–0.2)

## 2023-02-13 LAB — IRON AND TIBC
Iron: 153 ug/dL (ref 28–170)
Saturation Ratios: 36 % — ABNORMAL HIGH (ref 10.4–31.8)
TIBC: 427 ug/dL (ref 250–450)
UIBC: 274 ug/dL

## 2023-02-13 LAB — FERRITIN: Ferritin: 28 ng/mL (ref 11–307)

## 2023-02-13 MED ORDER — TRELEGY ELLIPTA 100-62.5-25 MCG/ACT IN AEPB: 1.0000 | INHALATION_SPRAY | Freq: Every day | RESPIRATORY_TRACT | 3 refills | Status: AC

## 2023-02-13 NOTE — Telephone Encounter (Signed)
The patient dropped off her form today. I have faxed the form and updated script to [facility].  Nothing further needed.

## 2023-02-14 NOTE — Progress Notes (Unsigned)
PTNS  Session # 1   Health & Social Factors: No change  Caffeine: 1 can of soda a day Alcohol: 0 Daytime voids #per day: 4-6 Night-time voids #per night: 3-4 Urgency: Severe Incontinence Episodes #per day: 10 Ankle used: Right Treatment Setting: 4 Feeling/ Response: Sensory  Comments: Patient tolerated procedure well   Performed By: Michiel Cowboy, PA-C   Follow Up: One week for PTNS # 2

## 2023-02-15 ENCOUNTER — Ambulatory Visit (INDEPENDENT_AMBULATORY_CARE_PROVIDER_SITE_OTHER): Payer: Medicare Other | Admitting: Urology

## 2023-02-15 ENCOUNTER — Encounter: Payer: Self-pay | Admitting: Urology

## 2023-02-15 VITALS — BP 172/70 | HR 98

## 2023-02-15 DIAGNOSIS — N3946 Mixed incontinence: Secondary | ICD-10-CM

## 2023-02-15 NOTE — Patient Instructions (Signed)

## 2023-02-19 ENCOUNTER — Inpatient Hospital Stay (HOSPITAL_BASED_OUTPATIENT_CLINIC_OR_DEPARTMENT_OTHER): Payer: Medicare Other | Admitting: Oncology

## 2023-02-19 ENCOUNTER — Inpatient Hospital Stay: Payer: Medicare Other

## 2023-02-19 ENCOUNTER — Encounter: Payer: Self-pay | Admitting: Oncology

## 2023-02-19 VITALS — BP 143/80 | HR 89 | Temp 96.3°F | Resp 18 | Wt 211.1 lb

## 2023-02-19 DIAGNOSIS — D508 Other iron deficiency anemias: Secondary | ICD-10-CM | POA: Diagnosis not present

## 2023-02-19 DIAGNOSIS — D509 Iron deficiency anemia, unspecified: Secondary | ICD-10-CM | POA: Diagnosis not present

## 2023-02-19 NOTE — Assessment & Plan Note (Addendum)
Reviewed labs with patient.   Lab Results  Component Value Date   HGB 13.3 02/13/2023   TIBC 427 02/13/2023   IRONPCTSAT 36 (H) 02/13/2023   FERRITIN 28 02/13/2023     Hemoglobin has improved. No need for additional IV venofer. She may take iron contained multi vitamin or ferrous sulfate once per week for maintenance.

## 2023-02-19 NOTE — Progress Notes (Signed)
Hematology/Oncology Consult note [phone] [phone]      Patient Care Team: [name], MD as PCP - General (Internal Medicine) [name], MD as PCP - Cardiology (Cardiology) [name], MD as Consulting Physician (Oncology)   REFERRING PROVIDER: [name], MD  CHIEF COMPLAINTS/REASON FOR VISIT:  Anemia  ASSESSMENT & PLAN:  IDA (iron deficiency anemia) Reviewed labs with patient.   Lab Results  Component Value Date   HGB 13.3 02/13/2023   TIBC 427 02/13/2023   IRONPCTSAT 36 (H) 02/13/2023   FERRITIN 28 02/13/2023     Hemoglobin has improved. No need for additional IV venofer. She may take iron contained multi vitamin or ferrous sulfate once per week for maintenance.    No orders of the defined types were placed in this encounter.  Follow up in 6 months.  All questions were answered. The patient knows to call the clinic with any problems, questions or concerns.  [name], MD, PhD [facility] Hematology Oncology 02/19/2023     HISTORY OF PRESENTING ILLNESS:  Angela Cohen is a  82 y.o.  female with PMH listed below who was referred to me for anemia Reviewed patient's recent labs that was done.  She was found to have abnormal CBC on 12/05/2021, hemoglobin 7.6, this dropped from 9.2  2 weeks prior.  12/05/2021, saturation 2.5, ferritin 12.5, TIBC 595 Reviewed patient's previous labs ordered by primary care physician's office,  Anemia is new onset.  Patient has a normal hemoglobin of 13  Patient reports feeling very tired, dizziness.  Shortness of breath with exertion. She denies recent chest pain on exertion,  pre-syncopal episodes, or palpitations She had not noticed any recent bleeding such as epistaxis, hematuria.  Occasionally she sees bright red blood in stool.  She has noticed darker stool for brief period of time in the past, now normal. Her primary care provider talk to them and have advised patient to start Slow Fe  with vitamin C supplementation while waiting for an appointment to establish with hematology.  Repeat CBC on 12/08/2021, hemoglobin was 7.7. Her last colonoscopy was 09/28/2014 -internal hemorrhoids 04/26/2016, EGD showed gastritis.  01/02/22 upper endoscopy showed LA Grade B reflux esophagitis with no bleeding, - 5 cm hiatal hernia with a few Cameron ulcers.- Erythematous mucosa in the antrum. Biopsy showed erosive gastritis Colonoscopy 2mm polyp   INTERVAL HISTORY Angela Cohen is a 82 y.o. female who has above history reviewed by me today presents for follow up visit for iron deficiency anemia. S/p IV Venofer treatments, tolerated well.  Fatigue has improved. No blood in stool.  She takes PPI.   MEDICAL HISTORY:  Past Medical History:  Diagnosis Date   Asthma    Fibrocystic breast disease    Gastritis    EGD - schatzki's ring, gastritis and gastric polyps   History of colon polyps    Hypercholesterolemia    Hypertension    MAI (mycobacterium avium-intracellulare) infection (HCC)    Osteoporosis    Reflux esophagitis     SURGICAL HISTORY: Past Surgical History:  Procedure Laterality Date   ABDOMINAL HYSTERECTOMY  1998   bladder tact     BREAST EXCISIONAL BIOPSY Bilateral 1970's   benign   BREAST EXCISIONAL BIOPSY Bilateral 2010   pt  stated dr Katrinka Blazing did bilat axilla lumps neg    BREAST SURGERY     cyst(benign)   CATARACT EXTRACTION W/PHACO Left 06/13/2021   Procedure: CATARACT EXTRACTION PHACO AND INTRAOCULAR LENS PLACEMENT (IOC) LEFT;  Surgeon: Nevada Crane, MD;  Location: [facility];  Service: Ophthalmology;  Laterality: Left;  7.04 0:40.5   CATARACT EXTRACTION W/PHACO Right 06/27/2021   Procedure: CATARACT EXTRACTION PHACO AND INTRAOCULAR LENS PLACEMENT (IOC) RIGHT;  Surgeon: Nevada Crane, MD;  Location: [facility];  Service: Ophthalmology;  Laterality: Right;  3.32 0:30.7   COLONOSCOPY N/A 09/28/2014   Procedure: COLONOSCOPY;  Surgeon:  Scot Jun, MD;  Location: [facility] ENDOSCOPY;  Service: Endoscopy;  Laterality: N/A;   COLONOSCOPY WITH PROPOFOL N/A 01/02/2022   Procedure: COLONOSCOPY WITH PROPOFOL;  Surgeon: Regis Bill, MD;  Location: [facility] ENDOSCOPY;  Service: Endoscopy;  Laterality: N/A;   ESOPHAGOGASTRODUODENOSCOPY N/A 09/28/2014   Procedure: ESOPHAGOGASTRODUODENOSCOPY (EGD);  Surgeon: Scot Jun, MD;  Location: [facility] ENDOSCOPY;  Service: Endoscopy;  Laterality: N/A;   ESOPHAGOGASTRODUODENOSCOPY (EGD) WITH PROPOFOL N/A 04/26/2016   Procedure: ESOPHAGOGASTRODUODENOSCOPY (EGD) WITH PROPOFOL;  Surgeon: Scot Jun, MD;  Location[facility] END[facility]COPY;  Service: Endoscopy;  Laterality: N/A;   ESOPHAGOGASTRODUODENOSCOPY (EGD) WITH PROPOFOL N/A 01/02/2022   Procedure: ESOPHAGOGASTRODUODENOSCOPY (EGD) WITH PROPOFOL;  Surgeon: Regis Bill, MD;  Location: [facility] ENDOSCOPY;  Service: Endoscopy;  Laterality: N/A;   excision of right axillary lipoma  02/2006   EYE SURGERY     FOOT SURGERY Right    HEMORRHOID SURGERY     HERNIA REPAIR  02/2006   NASAL SEPTUM SURGERY      SOCIAL HISTORY: Social History   Socioeconomic History   Marital status: Widowed    Spouse name: Not on file   Number of children: Not on file   Years of education: Not on file   Highest education level: Not on file  Occupational History   Not on file  Tobacco Use   Smoking status: Never    Passive exposure: Never   Smokeless tobacco: Never   Tobacco comments:    Never  Vaping Use   Vaping status: Never Used  Substance and Sexual Activity   Alcohol use: No    Alcohol/week: 0.0 standard drinks of alcohol   Drug use: No   Sexual activity: Not on file  Other Topics Concern   Not on file  Social History Narrative   Not on file   Social Determinants of Health   Financial Resource Strain: Not on file  Food Insecurity: Not on file  Transportation Needs: Not on file  Physical Activity: Not on file  Stress: Not on file  Social  Connections: Not on file  Intimate Partner Violence: Not on file    FAMILY HISTORY: Family History  Problem Relation Age of Onset   Heart disease Mother 80       heart attack   Cancer Father        lung   Diabetes Sister    Breast cancer Sister 90   Breast cancer Daughter 29   Stroke Brother    Diabetes Brother     ALLERGIES:  is allergic to [employer].  MEDICATIONS:  Current Outpatient Medications  Medication Sig Dispense Refill   albuterol (VENTOLIN HFA) 108 (90 Base) MCG/ACT inhaler Inhale 2 puffs into the lungs every 6 (six) hours as needed for wheezing or shortness of breath. 3 each 1   aspirin 81 MG EC tablet Take 81 mg by mouth daily. Swallow whole.     calcium carbonate (OSCAL) 1500 (600 Ca) MG TABS tablet Take by mouth 2 (two) times daily with a meal.     cholecalciferol (VITAMIN D3) 25 MCG (1000 UNIT)  tablet Take 1,000 Units by mouth daily.     [facility]S SLOW RELEASE IRON 143 (45 Fe) MG TBCR 1 TABLET DAILY 90 tablet 2   Fluticasone-Umeclidin-Vilant (TRELEGY ELLIPTA) 100-62.5-25 MCG/ACT AEPB Inhale 1 puff into the lungs daily. 180 each 3   iron dextran complex in sodium chloride 0.9 % 500 mL Inject into the vein once.     metoprolol succinate (TOPROL-XL) 25 MG 24 hr tablet Take 1 tablet (25 mg total) by mouth 2 (two) times daily. 180 tablet 1   montelukast (SINGULAIR) 10 MG tablet Take 1 tablet (10 mg total) by mouth at bedtime. 90 tablet 3   Multiple Vitamin (MULTIVITAMIN WITH MINERALS) TABS Take 1 tablet by mouth daily.     Omega-3 Fatty Acids (FISH OIL) 1000 MG CAPS Take 3,000 mg by mouth daily.     pantoprazole (PROTONIX) 40 MG tablet Take 1 tablet (40 mg total) by mouth 2 (two) times daily before a meal. 180 tablet 1   rosuvastatin (CRESTOR) 10 MG tablet TAKE 1 TABLET DAILY 90 tablet 3   No current facility-administered medications for this visit.    Review of Systems  Constitutional:  Positive for fatigue. Negative for appetite change, chills and fever.   HENT:   Negative for hearing loss and voice change.   Eyes:  Negative for eye problems.  Respiratory:  Negative for chest tightness and cough.   Cardiovascular:  Negative for chest pain.  Gastrointestinal:  Negative for abdominal distention, abdominal pain and blood in stool.  Endocrine: Negative for hot flashes.  Genitourinary:  Negative for difficulty urinating and frequency.   Musculoskeletal:  Negative for arthralgias.  Skin:  Negative for itching and rash.  Neurological:  Negative for extremity weakness.  Hematological:  Negative for adenopathy.  Psychiatric/Behavioral:  Negative for confusion.     PHYSICAL EXAMINATION: ECOG PERFORMANCE STATUS: 1 - Symptomatic but completely ambulatory Vitals:   02/19/23 1358  BP: (!) 143/80  Pulse: 89  Resp: 18  Temp: (!) 96.3 F (35.7 C)  SpO2: 96%   Filed Weights   02/19/23 1358  Weight: 211 lb 1.6 oz (95.8 kg)    Physical Exam Constitutional:      General: She is not in acute distress. HENT:     Head: Normocephalic and atraumatic.  Eyes:     General: No scleral icterus. Cardiovascular:     Rate and Rhythm: Normal rate and regular rhythm.     Heart sounds: Normal heart sounds.  Pulmonary:     Effort: Pulmonary effort is normal. No respiratory distress.     Breath sounds: No wheezing.  Abdominal:     General: Bowel sounds are normal. There is no distension.     Palpations: Abdomen is soft.  Musculoskeletal:        General: No deformity. Normal range of motion.     Cervical back: Normal range of motion and neck supple.  Skin:    General: Skin is warm and dry.     Coloration: Skin is not pale.     Findings: No erythema or rash.  Neurological:     Mental Status: She is alert and oriented to person, place, and time. Mental status is at baseline.     Cranial Nerves: No cranial nerve deficit.     Coordination: Coordination normal.  Psychiatric:        Mood and Affect: Mood normal.      LABORATORY DATA:  I have  reviewed the data as listed    Latest  Ref Rng & Units 02/13/2023    8:54 AM 08/08/2022   10:52 AM 02/06/2022    8:56 AM  CBC  WBC 4.0 - 10.5 K/uL 5.1  5.1  4.7   Hemoglobin 12.0 - 15.0 g/dL 40.9  81.1  91.4   Hematocrit 36.0 - 46.0 % 39.9  40.4  41.6   Platelets 150 - 400 K/uL 217  251  241       Latest Ref Rng & Units 11/14/2022   10:34 AM 10/30/2022   11:41 AM 05/30/2022   11:34 AM  CMP  Glucose 70 - 99 mg/dL  782  956   BUN 6 - 23 mg/dL  13  12   Creatinine 2.13 - 1.20 mg/dL  0.86  5.78   Sodium 469 - 145 mEq/L  139  140   Potassium 3.5 - 5.1 mEq/L  4.2  4.1   Chloride 96 - 112 mEq/L  103  103   CO2 19 - 32 mEq/L  28  29   Calcium 8.4 - 10.5 mg/dL  8.9  8.7   Total Protein 6.0 - 8.3 g/dL 7.0  7.7  6.8   Total Bilirubin 0.2 - 1.2 mg/dL 0.3  0.4  0.4   Alkaline Phos 39 - 117 U/L 50  71  60   AST 0 - 37 U/L 25  47  19   ALT 0 - 35 U/L 23  44  20       Component Value Date/Time   IRON 153 02/13/2023 0854   TIBC 427 02/13/2023 0854   FERRITIN 28 02/13/2023 0854   IRONPCTSAT 36 (H) 02/13/2023 0854     RADIOGRAPHIC STUDIES: I have personally reviewed the radiological images as listed and agreed with the findings in the report. No results found.

## 2023-02-21 NOTE — Progress Notes (Unsigned)
PTNS  Session # 2  Health & Social Factors: No Change Caffeine: 0 Alcohol: 0 Daytime voids #per day: 8 Night-time voids #per night: 4-5 Urgency: Severe  Incontinence Episodes #per day: all day Ankle used: Left Treatment Setting: 4 Feeling/ Response: Sensory  Comments: Patient tolerated the procedure.   Performed By: Michiel Cowboy, PA-C   Follow Up: One week for #3/12 PTNS

## 2023-02-22 ENCOUNTER — Encounter: Payer: Self-pay | Admitting: Urology

## 2023-02-22 ENCOUNTER — Ambulatory Visit: Payer: Medicare Other | Admitting: Urology

## 2023-02-22 VITALS — Ht 64.0 in | Wt 211.0 lb

## 2023-02-22 DIAGNOSIS — N3946 Mixed incontinence: Secondary | ICD-10-CM | POA: Diagnosis not present

## 2023-02-28 NOTE — Progress Notes (Unsigned)
PTNS ? ?Session # *** ? ?Health & Social Factors: *** ?Caffeine: *** ?Alcohol: *** ?Daytime voids #per day: *** ?Night-time voids #per night: *** ?Urgency: *** ?Incontinence Episodes #per day: *** ?Ankle used: *** ?Treatment Setting: *** ?Feeling/ Response: *** ?Comments: *** ? ?Performed By: *** ? ?Follow Up: *** ? ?

## 2023-03-01 ENCOUNTER — Encounter: Payer: Self-pay | Admitting: Urology

## 2023-03-01 ENCOUNTER — Ambulatory Visit: Payer: Medicare Other | Admitting: Urology

## 2023-03-01 VITALS — BP 149/77 | HR 98 | Ht 64.0 in | Wt 211.0 lb

## 2023-03-01 DIAGNOSIS — N3946 Mixed incontinence: Secondary | ICD-10-CM

## 2023-03-05 ENCOUNTER — Ambulatory Visit: Payer: Medicare Other | Admitting: Internal Medicine

## 2023-03-06 NOTE — Progress Notes (Unsigned)
PTNS  Session # 4  Health & Social Factors: No change  Caffeine: 1/2 glass a day Alcohol: 0 Daytime voids #per day: 7 Night-time voids #per night: 4 Urgency: strong Incontinence Episodes #per day: 12 Ankle used: Right  Treatment Setting: 5 Feeling/ Response: Sensory  Comments: Patient tolerated procedure  Performed By: Michiel Cowboy, PA-C   Follow Up: One week for #5 PTNS

## 2023-03-08 ENCOUNTER — Ambulatory Visit: Payer: Medicare Other | Admitting: Urology

## 2023-03-08 DIAGNOSIS — N3946 Mixed incontinence: Secondary | ICD-10-CM | POA: Diagnosis not present

## 2023-03-08 NOTE — Patient Instructions (Signed)

## 2023-03-12 NOTE — Progress Notes (Unsigned)
PTNS  Session # 5  Health & Social Factors: No change Caffeine: 0 Alcohol: 0 Daytime voids #per day: 9 Night-time voids #per night: 6 Urgency: severe Incontinence Episodes #per day: 7 Ankle used: right Treatment Setting: right  Feeling/ Response: Sensory  Comments: Patient tolerated.   Performed By: Michiel Cowboy, PA-C   Follow Up: 1 week for #6 PTNS

## 2023-03-15 ENCOUNTER — Ambulatory Visit (INDEPENDENT_AMBULATORY_CARE_PROVIDER_SITE_OTHER): Payer: Medicare Other | Admitting: Urology

## 2023-03-15 ENCOUNTER — Encounter: Payer: Self-pay | Admitting: Urology

## 2023-03-15 VITALS — BP 161/82 | HR 97

## 2023-03-15 DIAGNOSIS — N3946 Mixed incontinence: Secondary | ICD-10-CM

## 2023-03-19 NOTE — Progress Notes (Unsigned)
PTNS  Session # 6  Health & Social Factors: *** Caffeine: *** Alcohol: *** Daytime voids #per day: *** Night-time voids #per night: *** Urgency: *** Incontinence Episodes #per day: *** Ankle used: *** Treatment Setting: *** Feeling/ Response: *** Comments: ***  Performed By: ***  Follow Up: ***

## 2023-03-22 ENCOUNTER — Encounter: Payer: Self-pay | Admitting: Urology

## 2023-03-22 ENCOUNTER — Ambulatory Visit: Payer: Medicare Other | Admitting: Urology

## 2023-03-22 DIAGNOSIS — N3941 Urge incontinence: Secondary | ICD-10-CM

## 2023-03-22 DIAGNOSIS — N3946 Mixed incontinence: Secondary | ICD-10-CM | POA: Diagnosis not present

## 2023-03-22 LAB — MICROSCOPIC EXAMINATION

## 2023-03-22 LAB — URINALYSIS, COMPLETE
Bilirubin, UA: NEGATIVE
Glucose, UA: NEGATIVE
Ketones, UA: NEGATIVE
Nitrite, UA: NEGATIVE
Protein,UA: NEGATIVE
RBC, UA: NEGATIVE
Specific Gravity, UA: 1.01 (ref 1.005–1.030)
Urobilinogen, Ur: 0.2 mg/dL (ref 0.2–1.0)
pH, UA: 5.5 (ref 5.0–7.5)

## 2023-03-26 ENCOUNTER — Telehealth: Payer: Self-pay

## 2023-03-26 LAB — CULTURE, URINE COMPREHENSIVE

## 2023-03-26 MED ORDER — SULFAMETHOXAZOLE-TRIMETHOPRIM 800-160 MG PO TABS: 1.0000 | ORAL_TABLET | Freq: Two times a day (BID) | ORAL | 0 refills | Status: DC

## 2023-03-26 NOTE — Telephone Encounter (Signed)
Pt aware.   Med erxed.

## 2023-03-26 NOTE — Telephone Encounter (Signed)
-----   Message from [facility] sent at 03/26/2023  9:42 AM EST ----- Please let Angela Cohen know that her urine culture was positive for infection.  I would like for her to start Septra DS twice daily for 7 days.

## 2023-03-27 NOTE — Progress Notes (Unsigned)
PTNS  Session # 7  Health & Social Factors: No change  Caffeine: 1 a week Alcohol: 0 Daytime voids #per day: 5 Night-time voids #per night: 6 Urgency: severe Incontinence Episodes #per day: 16 Ankle used: right  Treatment Setting: 5 Feeling/ Response: Sensory  Comments: Patient tolerated   Performed By: Michiel Cowboy, PA-C   Follow Up: One week for # 8 PTNS

## 2023-03-29 ENCOUNTER — Ambulatory Visit: Payer: Medicare Other | Admitting: Urology

## 2023-03-29 DIAGNOSIS — N3946 Mixed incontinence: Secondary | ICD-10-CM

## 2023-04-02 ENCOUNTER — Ambulatory Visit: Payer: Medicare Other | Admitting: Internal Medicine

## 2023-04-03 NOTE — Progress Notes (Unsigned)
PTNS  Session # 8  Health & Social Factors: no change Caffeine: 0 Alcohol: 0 Daytime voids #per day: 4-5 Night-time voids #per night: 3-4 Urgency: severe Incontinence Episodes #per day: 4-5 Ankle used: right Treatment Setting: 3 Feeling/ Response: sensory Comments:   Performed By: Jearld Pies RMA  Follow Up: 1 week f.u

## 2023-04-05 ENCOUNTER — Ambulatory Visit (INDEPENDENT_AMBULATORY_CARE_PROVIDER_SITE_OTHER): Payer: Medicare Other | Admitting: Urology

## 2023-04-05 DIAGNOSIS — N3946 Mixed incontinence: Secondary | ICD-10-CM | POA: Diagnosis not present

## 2023-04-05 NOTE — Patient Instructions (Signed)

## 2023-04-09 NOTE — Progress Notes (Unsigned)
PTNS  Session # 9  Health & Social Factors: No change  Caffeine: 1/2 cup weekly Alcohol: 0 Daytime voids #per day: 5 Night-time voids #per night: 6 Urgency: severe Incontinence Episodes #per day: 5 Ankle used: Left  Treatment Setting: 2 Feeling/ Response: Sensory Comments: Patient tolerated procedure  Performed By: Michiel Cowboy, PA-C   Follow Up: 1 week for number 10 out of 11 PTNS

## 2023-04-10 ENCOUNTER — Other Ambulatory Visit: Payer: Self-pay | Admitting: Internal Medicine

## 2023-04-10 DIAGNOSIS — Z1231 Encounter for screening mammogram for malignant neoplasm of breast: Secondary | ICD-10-CM

## 2023-04-11 ENCOUNTER — Ambulatory Visit: Payer: Medicare Other | Admitting: Urology

## 2023-04-11 DIAGNOSIS — N3946 Mixed incontinence: Secondary | ICD-10-CM | POA: Diagnosis not present

## 2023-04-16 NOTE — Progress Notes (Unsigned)
PTNS  Session # 10  Health & Social Factors: *** Caffeine: *** Alcohol: *** Daytime voids #per day: *** Night-time voids #per night: *** Urgency: *** Incontinence Episodes #per day: *** Ankle used: *** Treatment Setting: *** Feeling/ Response: *** Comments: ***  Performed By: ***  Follow Up: 4-week for number 11 out of 12 PTNS

## 2023-04-18 ENCOUNTER — Ambulatory Visit: Payer: Medicare Other | Admitting: Urology

## 2023-04-18 DIAGNOSIS — N3946 Mixed incontinence: Secondary | ICD-10-CM

## 2023-04-19 ENCOUNTER — Telehealth: Payer: Self-pay | Admitting: Internal Medicine

## 2023-04-19 NOTE — Telephone Encounter (Signed)
Patient asked if she could use Zyrtec. She has a stopped up nose. She stated that the package stated if you were over 50 you should ask your doctor before using.

## 2023-04-20 NOTE — Telephone Encounter (Signed)
LMTCB

## 2023-04-23 NOTE — Progress Notes (Unsigned)
 PTNS  Session # 10  Health & Social Factors: *** Caffeine: *** Alcohol: *** Daytime voids #per day: *** Night-time voids #per night: *** Urgency: *** Incontinence Episodes #per day: *** Ankle used: *** Treatment Setting: *** Feeling/ Response: *** Comments: ***  Performed By: ***  Follow Up: 1 week for number 11 out of 12 PTNS

## 2023-04-23 NOTE — Telephone Encounter (Signed)
Called patient to follow up. She says she has had intermittent dizziness when she changes positions too fast for about 3 weeks. She said she does not have a stopped up nose. Son told her that zyrtec may help but she did not want to take it without seeing Dr Lorin Picket. Patient scheduled. Pt wanted to wait until 12/16 to be seen. Advised she did not need to wait that long.

## 2023-04-24 ENCOUNTER — Encounter: Payer: Self-pay | Admitting: Internal Medicine

## 2023-04-24 ENCOUNTER — Ambulatory Visit: Payer: Medicare Other | Admitting: Internal Medicine

## 2023-04-24 VITALS — BP 128/70 | HR 73 | Temp 98.0°F | Resp 16 | Ht 64.0 in | Wt 210.0 lb

## 2023-04-24 DIAGNOSIS — R519 Headache, unspecified: Secondary | ICD-10-CM

## 2023-04-24 DIAGNOSIS — R1310 Dysphagia, unspecified: Secondary | ICD-10-CM

## 2023-04-24 DIAGNOSIS — E1165 Type 2 diabetes mellitus with hyperglycemia: Secondary | ICD-10-CM

## 2023-04-24 DIAGNOSIS — R42 Dizziness and giddiness: Secondary | ICD-10-CM | POA: Diagnosis not present

## 2023-04-24 DIAGNOSIS — K219 Gastro-esophageal reflux disease without esophagitis: Secondary | ICD-10-CM

## 2023-04-24 DIAGNOSIS — N644 Mastodynia: Secondary | ICD-10-CM

## 2023-04-24 DIAGNOSIS — I10 Essential (primary) hypertension: Secondary | ICD-10-CM

## 2023-04-24 DIAGNOSIS — D508 Other iron deficiency anemias: Secondary | ICD-10-CM

## 2023-04-24 DIAGNOSIS — R739 Hyperglycemia, unspecified: Secondary | ICD-10-CM

## 2023-04-24 DIAGNOSIS — D329 Benign neoplasm of meninges, unspecified: Secondary | ICD-10-CM

## 2023-04-24 DIAGNOSIS — E7800 Pure hypercholesterolemia, unspecified: Secondary | ICD-10-CM

## 2023-04-24 LAB — HEPATIC FUNCTION PANEL
ALT: 39 U/L — ABNORMAL HIGH (ref 0–35)
AST: 29 U/L (ref 0–37)
Albumin: 4.4 g/dL (ref 3.5–5.2)
Alkaline Phosphatase: 67 U/L (ref 39–117)
Bilirubin, Direct: 0.1 mg/dL (ref 0.0–0.3)
Total Bilirubin: 0.4 mg/dL (ref 0.2–1.2)
Total Protein: 7.4 g/dL (ref 6.0–8.3)

## 2023-04-24 LAB — BASIC METABOLIC PANEL
BUN: 18 mg/dL (ref 6–23)
CO2: 27 meq/L (ref 19–32)
Calcium: 8.9 mg/dL (ref 8.4–10.5)
Chloride: 103 meq/L (ref 96–112)
Creatinine, Ser: 0.8 mg/dL (ref 0.40–1.20)
GFR: 69.15 mL/min (ref >60.00)
Glucose, Bld: 139 mg/dL — ABNORMAL HIGH (ref 70–99)
Potassium: 4.3 meq/L (ref 3.5–5.1)
Sodium: 140 meq/L (ref 135–145)

## 2023-04-24 LAB — CBC WITH DIFFERENTIAL/PLATELET
Basophils Absolute: 0.1 10*3/uL (ref 0.0–0.1)
Basophils Relative: 1.1 % (ref 0.0–3.0)
Eosinophils Absolute: 0.3 10*3/uL (ref 0.0–0.7)
Eosinophils Relative: 5.7 % — ABNORMAL HIGH (ref 0.0–5.0)
HCT: 38.5 % (ref 36.0–46.0)
Hemoglobin: 12.7 g/dL (ref 12.0–15.0)
Lymphocytes Relative: 22.9 % (ref 12.0–46.0)
Lymphs Abs: 1.2 10*3/uL (ref 0.7–4.0)
MCHC: 32.9 g/dL (ref 30.0–36.0)
MCV: 100.1 fL — ABNORMAL HIGH (ref 78.0–100.0)
Monocytes Absolute: 0.5 10*3/uL (ref 0.1–1.0)
Monocytes Relative: 8.9 % (ref 3.0–12.0)
Neutro Abs: 3.2 10*3/uL (ref 1.4–7.7)
Neutrophils Relative %: 61.4 % (ref 43.0–77.0)
Platelets: 280 10*3/uL (ref 150.0–400.0)
RBC: 3.85 Mil/uL — ABNORMAL LOW (ref 3.87–5.11)
RDW: 13.8 % (ref 11.5–15.5)
WBC: 5.3 10*3/uL (ref 4.0–10.5)

## 2023-04-24 LAB — SEDIMENTATION RATE: Sed Rate: 22 mm/h (ref 0–30)

## 2023-04-24 LAB — LIPID PANEL
Cholesterol: 166 mg/dL (ref 0–200)
HDL: 75.9 mg/dL (ref >39.00)
LDL Cholesterol: 57 mg/dL (ref 0–99)
NonHDL: 90.38
Total CHOL/HDL Ratio: 2
Triglycerides: 169 mg/dL — ABNORMAL HIGH (ref 0.0–149.0)
VLDL: 33.8 mg/dL (ref 0.0–40.0)

## 2023-04-24 LAB — HEMOGLOBIN A1C: Hgb A1c MFr Bld: 6.8 % — ABNORMAL HIGH (ref 4.6–6.5)

## 2023-04-24 NOTE — Assessment & Plan Note (Signed)
Left breast pain as outlined.  She is currently scheduled for screening mammogram.  Discussed with mammography. Will change to diagnostic mammogram with possible left breast ultrasound.

## 2023-04-24 NOTE — Assessment & Plan Note (Signed)
Continue Crestor.  Low-cholesterol diet and exercise.  Follow-up fast lipid profile liver panel.

## 2023-04-24 NOTE — Assessment & Plan Note (Signed)
Saw neurology 06/20/22 - follow up - headaches and feeling swimmy headed.  Diagnosed with occipital neuralgia versus post concussive headache syndrome.  Recommended continuing neck exercises and was started on baclofen. Meningioma - stable since 2016. Also BPV diagnosed.  Has seen ENT.  Recommended doing epley maneuver and exercises at that time.  Comes in today with reports of increased dizziness as outlined.  Describes room spinning with position changes.  Reproducible on exam.  Given persistent symptoms/worsening dizziness, will get her back in with ENT for evaluation.

## 2023-04-24 NOTE — Assessment & Plan Note (Signed)
Has a history of vertigo. Has seen ENT previously. Also had a fall two years ago. Hit her head.  Since her fall, she has had persistent headache (dull). Takes ibuprofen and tylenol q am. Reports that 2-3 weeks ago, started having room spinning when looking down or when lying down flat in bed. Describes room spinning. Will experience headache when this occurs. Also, associated nausea with increased dizziness. No increased sinus congestion. Given worsening symptoms and exam findings c/w vertigo, refer back to ENT for evaluation.

## 2023-04-24 NOTE — Assessment & Plan Note (Signed)
PPI to treat acid reflux.

## 2023-04-24 NOTE — Assessment & Plan Note (Signed)
Blood pressure as outlined.  On metoprolol.  Continue current medication. Follow pressures. Follow metabolic panel.

## 2023-04-24 NOTE — Progress Notes (Signed)
Subjective:    [name], female    DOB: 01/06/1941, 82 y.o.   MRN: [id number]  Patient here for  Chief Complaint  Patient presents with   Dizziness    HPI Here for a work in appt - work in with concerns regarding intermittent dizziness. Sees dr Jayme Cloud for moderate persistent asthma. CT chest showed no ILD, very large hiatal hernia with intrathoracic stomach. Continue Trelegy. She has severe issues with gastroesophageal reflux she has been noted to have esophageal dysmotility and a hiatal hernia that is at least 5 cm in size. Controls acid reflux with PPI.  Seeing Dr Cathie Hoops - f/u iron deficiency anemia.  Last evaluated 02/19/23 - hgb improved.  Recommended iron weekly for maintenance. Request cbc check today to confirm stable. Seeing urology for PTNS treatment - mixed incontinence. Has a history of vertigo. Has seen ENT previously. Also had a fall two years ago. Hit her head.  Since her fall, she has had persistent headache (dull). Takes ibuprofen and tylenol q am. Reports that 2-3 weeks ago, started having room spinning when looking down or when lying down flat in bed. Describes room spinning. Will experience headache when this occurs. Also, associated nausea with increased dizziness. No increased sinus congestion. No chest pain. Is having left breast pain - 9:00 region.  Intermittent sharp pain described, but reproducible on exam. No abdominal pain. No vomiting and no diarrhea.    Past Medical History:  Diagnosis Date   Asthma    Fibrocystic breast disease    Gastritis    EGD - schatzki's ring, gastritis and gastric polyps   History of colon polyps    Hypercholesterolemia    Hypertension    MAI (mycobacterium avium-intracellulare) infection (HCC)    Osteoporosis    Reflux esophagitis    Past Surgical History:  Procedure Laterality Date   ABDOMINAL HYSTERECTOMY  1998   bladder tact     BREAST EXCISIONAL BIOPSY Bilateral 1970's   benign   BREAST EXCISIONAL BIOPSY  Bilateral 2010   pt  stated dr Katrinka Blazing did bilat axilla lumps neg    BREAST SURGERY     cyst(benign)   CATARACT EXTRACTION W/PHACO Left 06/13/2021   Procedure: CATARACT EXTRACTION PHACO AND INTRAOCULAR LENS PLACEMENT (IOC) LEFT;  Surgeon: Nevada Crane, MD;  Location: [facility];  Service: Ophthalmology;  Laterality: Left;  7.04 0:40.5   CATARACT EXTRACTION W/PHACO Right 06/27/2021   Procedure: CATARACT EXTRACTION PHACO AND INTRAOCULAR LENS PLACEMENT (IOC) RIGHT;  Surgeon: Nevada Crane, MD;  Location: [facility];  Service: Ophthalmology;  Laterality: Right;  3.32 0:30.7   COLONOSCOPY N/A 09/28/2014   Procedure: COLONOSCOPY;  Surgeon: Scot Jun, MD;  Location: [facility] ENDOSCOPY;  Service: Endoscopy;  Laterality: N/A;   COLONOSCOPY WITH PROPOFOL N/A 01/02/2022   Procedure: COLONOSCOPY WITH PROPOFOL;  Surgeon: Regis Bill, MD;  Location: [facility] ENDOSCOPY;  Service: Endoscopy;  Laterality: N/A;   ESOPHAGOGASTRODUODENOSCOPY N/A 09/28/2014   Procedure: ESOPHAGOGASTRODUODENOSCOPY (EGD);  Surgeon: Scot Jun, MD;  Location: [facility] ENDOSCOPY;  Service: Endoscopy;  Laterality: N/A;   ESOPHAGOGASTRODUODENOSCOPY (EGD) WITH PROPOFOL N/A 04/26/2016   Procedure: ESOPHAGOGASTRODUODENOSCOPY (EGD) WITH PROPOFOL;  Surgeon: Scot Jun, MD;  Location: [facility] ENDOSCOPY;  Service: Endoscopy;  Laterality: N/A;   ESOPHAGOGASTRODUODENOSCOPY (EGD) WITH PROPOFOL N/A 01/02/2022   Procedure: ESOPHAGOGASTRODUODENOSCOPY (EGD) WITH PROPOFOL;  Surgeon: Regis Bill, MD;  Location: [facility] ENDOSCOPY;  Service: Endoscopy;  Laterality: N/A;   excision of right axillary lipoma  02/2006  EYE SURGERY     FOOT SURGERY Right    HEMORRHOID SURGERY     HERNIA REPAIR  02/2006   NASAL SEPTUM SURGERY     Family History  Problem Relation Age of Onset   Heart disease Mother 70       heart attack   Cancer Father        lung   Diabetes Sister    Breast cancer Sister 25   Breast cancer  Daughter 41   Stroke Brother    Diabetes Brother    Social History   Socioeconomic History   Marital status: Widowed    Spouse name: Not on file   Number of children: Not on file   Years of education: Not on file   Highest education level: Not on file  Occupational History   Not on file  Tobacco Use   Smoking status: Never    Passive exposure: Never   Smokeless tobacco: Never   Tobacco comments:    Never  Vaping Use   Vaping status: Never Used  Substance and Sexual Activity   Alcohol use: No    Alcohol/week: 0.0 standard drinks of alcohol   Drug use: No   Sexual activity: Not on file  Other Topics Concern   Not on file  Social History Narrative   Not on file   Social Determinants of Health   Financial Resource Strain: Not on file  Food Insecurity: Not on file  Transportation Needs: Not on file  Physical Activity: Not on file  Stress: Not on file  Social Connections: Not on file     Review of Systems  Constitutional:  Negative for appetite change and unexpected weight change.  HENT:  Negative for congestion and sinus pressure.   Respiratory:  Negative for cough, chest tightness and shortness of breath.   Cardiovascular:  Negative for chest pain, palpitations and leg swelling.  Gastrointestinal:  Negative for abdominal pain, constipation and vomiting.       Some nausea associated with dizziness.   Genitourinary:  Negative for difficulty urinating and dysuria.  Musculoskeletal:  Negative for joint swelling and myalgias.  Skin:  Negative for color change and rash.  Neurological:  Positive for dizziness and headaches.  Psychiatric/Behavioral:  Negative for agitation and dysphoric mood.        Objective:     BP 128/70   Pulse 73   Temp 98 F (36.7 C)   Resp 16   Ht 5\' 4"  (1.626 m)   Wt 210 lb (95.3 kg)   SpO2 98%   BMI 36.05 kg/m  Wt Readings from Last 3 Encounters:  04/24/23 210 lb (95.3 kg)  03/01/23 211 lb (95.7 kg)  02/22/23 211 lb (95.7 kg)    Blood sugar today 146.   Physical Exam Vitals reviewed.  Constitutional:      General: She is not in acute distress.    Appearance: Normal appearance.  HENT:     Head: Normocephalic and atraumatic.     Right Ear: External ear normal.     Left Ear: External ear normal.  Eyes:     General: No scleral icterus.       Right eye: No discharge.        Left eye: No discharge.     Conjunctiva/sclera: Conjunctivae normal.  Neck:     Thyroid: No thyromegaly.  Cardiovascular:     Rate and Rhythm: Normal rate and regular rhythm.  Pulmonary:     Effort: No respiratory  distress.     Breath sounds: Normal breath sounds. No wheezing.     Comments: Breasts:  no nipple discharge or nipple retraction present. Increased tenderness - 9:00 left breast. No palpable nodule or tenderness right breast.  No axillary adenopathy.  Abdominal:     General: Bowel sounds are normal.     Palpations: Abdomen is soft.     Tenderness: There is no abdominal tenderness.  Musculoskeletal:        General: No swelling or tenderness.     Cervical back: Neck supple. No tenderness.  Lymphadenopathy:     Cervical: No cervical adenopathy.  Skin:    Findings: No erythema or rash.  Neurological:     Mental Status: She is alert.     Comments: Reproducible room spinning - with bending forward.  Also reproducible with going from sitting position to lying flat.   Psychiatric:        Mood and Affect: Mood normal.        Behavior: Behavior normal.      Outpatient Encounter Medications as of 04/24/2023  Medication Sig   albuterol (VENTOLIN HFA) 108 (90 Base) MCG/ACT inhaler Inhale 2 puffs into the lungs every 6 (six) hours as needed for wheezing or shortness of breath.   aspirin 81 MG EC tablet Take 81 mg by mouth daily. Swallow whole.   calcium carbonate (OSCAL) 1500 (600 Ca) MG TABS tablet Take by mouth 2 (two) times daily with a meal.   cholecalciferol (VITAMIN D3) 25 MCG (1000 UNIT) tablet Take 1,000 Units by mouth  daily.   [employer] SLOW RELEASE IRON 143 (45 Fe) MG TBCR 1 TABLET DAILY   Fluticasone-Umeclidin-Vilant (TRELEGY ELLIPTA) 100-62.5-25 MCG/ACT AEPB Inhale 1 puff into the lungs daily.   iron dextran complex in sodium chloride 0.9 % 500 mL Inject into the vein once.   metoprolol succinate (TOPROL-XL) 25 MG 24 hr tablet Take 1 tablet (25 mg total) by mouth 2 (two) times daily.   montelukast (SINGULAIR) 10 MG tablet Take 1 tablet (10 mg total) by mouth at bedtime.   Multiple Vitamin (MULTIVITAMIN WITH MINERALS) TABS Take 1 tablet by mouth daily.   Omega-3 Fatty Acids (FISH OIL) 1000 MG CAPS Take 3,000 mg by mouth daily.   pantoprazole (PROTONIX) 40 MG tablet Take 1 tablet (40 mg total) by mouth 2 (two) times daily before a meal.   rosuvastatin (CRESTOR) 10 MG tablet TAKE 1 TABLET DAILY   [DISCONTINUED] sulfamethoxazole-trimethoprim (BACTRIM DS) 800-160 MG tablet Take 1 tablet by mouth every 12 (twelve) hours.   No facility-administered encounter medications on file as of 04/24/2023.     Lab Results  Component Value Date   WBC 5.1 02/13/2023   HGB 13.3 02/13/2023   HCT 39.9 02/13/2023   PLT 217 02/13/2023   GLUCOSE 119 (H) 10/30/2022   CHOL 149 10/30/2022   TRIG 136.0 10/30/2022   HDL 73.10 10/30/2022   LDLCALC 49 10/30/2022   ALT 23 11/14/2022   AST 25 11/14/2022   NA 139 10/30/2022   K 4.2 10/30/2022   CL 103 10/30/2022   CREATININE 0.86 10/30/2022   BUN 13 10/30/2022   CO2 28 10/30/2022   TSH 2.09 10/30/2022   HGBA1C 6.5 10/30/2022   MICROALBUR <0.7 05/30/2022    CT CHEST WO CONTRAST  Result Date: 12/17/2022 CLINICAL DATA:  Cough, chronic/persisting > 8 weeks, failed empiric treatment Chronic Cough and SOB EXAM: CT CHEST WITHOUT CONTRAST TECHNIQUE: Multidetector CT imaging of the chest was performed following the  standard protocol without IV contrast. RADIATION DOSE REDUCTION: This exam was performed according to the departmental dose-optimization program which includes automated  exposure control, adjustment of the mA and/or kV according to patient size and/or use of iterative reconstruction technique. COMPARISON:  September 01, 2009, August 16, 2021 FINDINGS: Cardiovascular: Heart is normal in size. No pericardial effusion. Predominately LEFT-sided coronary artery atherosclerotic calcifications. Thoracic aorta is normal in course and caliber with mild scattered atherosclerotic calcifications. Mediastinum/Nodes: Visualized thyroid is unremarkable. No axillary or mediastinal adenopathy. Lungs/Pleura: No pleural effusion or pneumothorax. Stable biapical scarring. Unchanged RIGHT upper lobe clustered pulmonary nodules with internal faint calcifications since 2011, most consistent with a benign etiology (series 3, image 52, image 57). Unchanged lingular scarring. LEFT apical 3 mm pulmonary nodule, unchanged since 2023 (series 3, image 22). Upper Abdomen: Large hiatal hernia Musculoskeletal: Degenerative changes of the thoracic spine. Status post LEFT shoulder arthroplasty. IMPRESSION: 1. There is a large hiatal hernia. Otherwise no suspicious CT etiology for recurrent cough is identified. Aortic Atherosclerosis (ICD10-I70.0). Electronically Signed   By: Meda Klinefelter M.D.   On: 12/17/2022 11:23       Assessment & Plan:  Dizziness Assessment & Plan: Has a history of vertigo. Has seen ENT previously. Also had a fall two years ago. Hit her head.  Since her fall, she has had persistent headache (dull). Takes ibuprofen and tylenol q am. Reports that 2-3 weeks ago, started having room spinning when looking down or when lying down flat in bed. Describes room spinning. Will experience headache when this occurs. Also, associated nausea with increased dizziness. No increased sinus congestion. Given worsening symptoms and exam findings c/w vertigo, refer back to ENT for evaluation.    Hypercholesterolemia Assessment & Plan: Continue Crestor.  Low-cholesterol diet and exercise.  Follow-up fast  lipid profile liver panel.  Orders: -     CBC with Differential/Platelet -     Hepatic function panel -     Lipid panel  Hyperglycemia  Type 2 diabetes mellitus with hyperglycemia, without long-term current use of insulin (HCC) Assessment & Plan: Low carb diet and exercise.  Follow met b and a1c.    Orders: -     Basic metabolic panel -     Hemoglobin A1c  Nonintractable headache, unspecified chronicity pattern, unspecified headache type Assessment & Plan: Saw Dr Sherryll Burger previously.  Occipital neuralgia vs post concussive syndrome.  Baclofen.  With increased symptoms now as outlined. Refer back to ENT for treatment of her vertigo. Will need f/u with Dr Sherryll Burger.  Check labs as outlned.   Orders: -     Sedimentation rate  Vertigo -     Ambulatory referral to ENT  Breast pain Assessment & Plan: Left breast pain as outlined.  She is currently scheduled for screening mammogram.  Discussed with mammography. Will change to diagnostic mammogram with possible left breast ultrasound.   Orders: -     MM 3D DIAGNOSTIC MAMMOGRAM BILATERAL BREAST; Future -     [place] LIMITED ULTRASOUND INCLUDING AXILLA RIGHT BREAST; Future  Meningioma [facility]) Assessment & Plan: Saw neurology 06/20/22 - follow up - headaches and feeling swimmy headed.  Diagnosed with occipital neuralgia versus post concussive headache syndrome.  Recommended continuing neck exercises and was started on baclofen. Meningioma - stable since 2016. Also BPV diagnosed.  Has seen ENT.  Recommended doing epley maneuver and exercises at that time.  Comes in today with reports of increased dizziness as outlined.  Describes room spinning with position changes.  Reproducible  on exam.  Given persistent symptoms/worsening dizziness, will get her back in with ENT for evaluation.     Other iron deficiency anemia Assessment & Plan:  Seeing Dr Cathie Hoops - f/u iron deficiency anemia.  Last evaluated 02/19/23 - hgb improved.  Recommended iron weekly for  maintenance. Request cbc check today to confirm stable.    Gastroesophageal reflux disease, unspecified whether esophagitis present Assessment & Plan: PPI to treat acid reflux.    Essential hypertension, benign Assessment & Plan: Blood pressure as outlined.  On metoprolol.  Continue current medication. Follow pressures. Follow metabolic panel.    Dysphagia, unspecified type Assessment & Plan: CT chest showed no ILD, very large hiatal hernia with intrathoracic stomach. Continue Trelegy. She has severe issues with gastroesophageal reflux she has been noted to have esophageal dysmotility and a hiatal hernia that is at least 5 cm in size. Controls acid reflux with PPI.  Aware to chew food well, eat slowly and small bites.     I spent 45 minutes with the patient.   Time spent discussing her current concerns and symptoms.  Specifically time spent discussing her dizziness and previous w/up, breast tenderness and headache. Time also spent discussing further w/up, evaluation and treatment.    Dale Mason, MD

## 2023-04-24 NOTE — Assessment & Plan Note (Signed)
Seeing Dr Cathie Hoops - f/u iron deficiency anemia.  Last evaluated 02/19/23 - hgb improved.  Recommended iron weekly for maintenance. Request cbc check today to confirm stable.

## 2023-04-24 NOTE — Assessment & Plan Note (Signed)
CT chest showed no ILD, very large hiatal hernia with intrathoracic stomach. Continue Trelegy. She has severe issues with gastroesophageal reflux she has been noted to have esophageal dysmotility and a hiatal hernia that is at least 5 cm in size. Controls acid reflux with PPI.  Aware to chew food well, eat slowly and small bites.

## 2023-04-24 NOTE — Assessment & Plan Note (Signed)
Low carb diet and exercise.  Follow met b and a1c.   

## 2023-04-24 NOTE — Assessment & Plan Note (Signed)
Saw Dr Sherryll Burger previously.  Occipital neuralgia vs post concussive syndrome.  Baclofen.  With increased symptoms now as outlined. Refer back to ENT for treatment of her vertigo. Will need f/u with Dr Sherryll Burger.  Check labs as outlned.

## 2023-04-25 ENCOUNTER — Ambulatory Visit: Payer: Medicare Other | Admitting: Urology

## 2023-04-26 ENCOUNTER — Other Ambulatory Visit: Payer: Self-pay

## 2023-04-26 ENCOUNTER — Telehealth: Payer: Self-pay | Admitting: Urology

## 2023-04-26 ENCOUNTER — Other Ambulatory Visit: Payer: Self-pay | Admitting: Internal Medicine

## 2023-04-26 DIAGNOSIS — R7989 Other specified abnormal findings of blood chemistry: Secondary | ICD-10-CM

## 2023-04-26 NOTE — Progress Notes (Signed)
Order placed for f/u liver panel.  

## 2023-04-26 NOTE — Telephone Encounter (Signed)
Pt called to cancel her PTNS (last treatment) on 12/18.  She has also missed her last 3 treatments due to having vertigo and unable to drive.  She feels like the PTNS is not helping and has a follow up w/MacDiarmid on 1/13.

## 2023-04-30 ENCOUNTER — Ambulatory Visit: Payer: Medicare Other | Admitting: Internal Medicine

## 2023-05-02 ENCOUNTER — Ambulatory Visit: Payer: Medicare Other | Admitting: Urology

## 2023-05-07 ENCOUNTER — Ambulatory Visit: Payer: Medicare Other | Admitting: Pulmonary Disease

## 2023-05-11 ENCOUNTER — Other Ambulatory Visit: Payer: Medicare Other

## 2023-05-14 ENCOUNTER — Ambulatory Visit
Admission: RE | Admit: 2023-05-14 | Discharge: 2023-05-14 | Disposition: A | Discharge status: Home / Self Care | Payer: Medicare Other | Source: Ambulatory Visit | Attending: Internal Medicine | Admitting: Internal Medicine

## 2023-05-14 DIAGNOSIS — N644 Mastodynia: Secondary | ICD-10-CM

## 2023-05-17 ENCOUNTER — Telehealth: Payer: Self-pay

## 2023-05-17 NOTE — Telephone Encounter (Signed)
 See result note.

## 2023-05-17 NOTE — Telephone Encounter (Signed)
 Copied from CRM [id number]. Topic: Clinical - Medical Advice >> May 17, 2023 10:00 AM Melissa C wrote: Reason for CRM: patient returning a call to the nurse, asked for a call back please.

## 2023-05-21 ENCOUNTER — Other Ambulatory Visit (INDEPENDENT_AMBULATORY_CARE_PROVIDER_SITE_OTHER): Payer: Medicare Other

## 2023-05-21 DIAGNOSIS — R7989 Other specified abnormal findings of blood chemistry: Secondary | ICD-10-CM

## 2023-05-21 LAB — HEPATIC FUNCTION PANEL
ALT: 42 U/L — ABNORMAL HIGH (ref 0–35)
AST: 31 U/L (ref 0–37)
Albumin: 4.3 g/dL (ref 3.5–5.2)
Alkaline Phosphatase: 85 U/L (ref 39–117)
Bilirubin, Direct: 0.1 mg/dL (ref 0.0–0.3)
Total Bilirubin: 0.4 mg/dL (ref 0.2–1.2)
Total Protein: 7.3 g/dL (ref 6.0–8.3)

## 2023-05-23 ENCOUNTER — Other Ambulatory Visit: Payer: Self-pay | Admitting: Unknown Physician Specialty

## 2023-05-23 ENCOUNTER — Telehealth: Payer: Self-pay | Admitting: *Deleted

## 2023-05-23 DIAGNOSIS — R519 Headache, unspecified: Secondary | ICD-10-CM

## 2023-05-23 NOTE — Telephone Encounter (Signed)
 Reason for CRM: Patient returned call to [facility] regarding labs, please call back at [phone] when available

## 2023-05-23 NOTE — Telephone Encounter (Signed)
 See result note.

## 2023-05-28 ENCOUNTER — Ambulatory Visit: Payer: Medicare Other | Admitting: Urology

## 2023-05-31 MED ORDER — DENOSUMAB 60 MG/ML ~~LOC~~ SOSY
60.0000 mg | PREFILLED_SYRINGE | Freq: Once | SUBCUTANEOUS | Status: AC
  Administered 2023-06-18: 60 mg via SUBCUTANEOUS

## 2023-05-31 NOTE — Addendum Note (Signed)
Addended by: Warden Fillers on: 05/31/2023 10:52 AM   Modules accepted: Orders

## 2023-06-10 NOTE — Progress Notes (Unsigned)
Subjective:    [name], female    DOB: [birth year], 83 y.o.   MRN: [id number]  Patient here for No chief complaint on file.   HPI Here for a scheduled follow up - follow up regarding hypercholesterolemia and hypertension. Sees dr Jayme Cloud for moderate persistent asthma. CT chest showed no ILD, very large hiatal hernia with intrathoracic stomach. Continue Trelegy. She has severe issues with gastroesophageal reflux she has been noted to have esophageal dysmotility and a hiatal hernia that is at least 5 cm in size. Controls acid reflux with PPI.  Seeing Dr Cathie Hoops - f/u iron deficiency anemia.  Last evaluated 02/19/23 - hgb improved.  Recommended iron weekly for maintenance. Was seen 06/09/23 - diagnosed with sinus infection. Treated with tessalon perles, prednisone and zpak. Saw neurology 05/28/23 - occipital neuralgia vs post concussive headache syndrome. Saw ENT - MRI scheduled for 06/11/23. Started baclofen. Seeing urology for PTNS treatment - mixed incontinence.    Past Medical History:  Diagnosis Date   Asthma    Fibrocystic breast disease    Gastritis    EGD - schatzki's ring, gastritis and gastric polyps   History of colon polyps    Hypercholesterolemia    Hypertension    MAI (mycobacterium avium-intracellulare) infection (HCC)    Osteoporosis    Reflux esophagitis    Past Surgical History:  Procedure Laterality Date   ABDOMINAL HYSTERECTOMY  1998   bladder tact     BREAST EXCISIONAL BIOPSY Bilateral 1970's   benign   BREAST EXCISIONAL BIOPSY Bilateral 2010   pt  stated dr Katrinka Blazing did bilat axilla lumps neg    BREAST SURGERY     cyst(benign)   CATARACT EXTRACTION W/PHACO Left 06/13/2021   Procedure: CATARACT EXTRACTION PHACO AND INTRAOCULAR LENS PLACEMENT (IOC) LEFT;  Surgeon: Nevada Crane, MD;  Location: [facility];  Service: Ophthalmology;  Laterality: Left;  7.04 0:40.5   CATARACT EXTRACTION W/PHACO Right 06/27/2021   Procedure: CATARACT  EXTRACTION PHACO AND INTRAOCULAR LENS PLACEMENT (IOC) RIGHT;  Surgeon: Nevada Crane, MD;  Location: [facility];  Service: Ophthalmology;  Laterality: Right;  3.32 0:30.7   COLONOSCOPY N/A 09/28/2014   Procedure: COLONOSCOPY;  Surgeon: Scot Jun, MD;  Location: [facility];  Service: Endoscopy;  Laterality: N/A;   COLONOSCOPY WITH PROPOFOL N/A 01/02/2022   Procedure: COLONOSCOPY WITH PROPOFOL;  Surgeon: Regis Bill, MD;  Location: [facility];  Service: Endoscopy;  Laterality: N/A;   ESOPHAGOGASTRODUODENOSCOPY N/A 09/28/2014   Procedure: ESOPHAGOGASTRODUODENOSCOPY (EGD);  Surgeon: Scot Jun, MD;  Location: [facility];  Service: Endoscopy;  Laterality: N/A;   ESOPHAGOGASTRODUODENOSCOPY (EGD) WITH PROPOFOL N/A 04/26/2016   Procedure: ESOPHAGOGASTRODUODENOSCOPY (EGD) WITH PROPOFOL;  Surgeon: Scot Jun, MD;  Location: [facility];  Service: Endoscopy;  Laterality: N/A;   ESOPHAGOGASTRODUODENOSCOPY (EGD) WITH PROPOFOL N/A 01/02/2022   Procedure: ESOPHAGOGASTRODUODENOSCOPY (EGD) WITH PROPOFOL;  Surgeon: Regis Bill, MD;  Location: [facility] ENDOSCOPY;  Service: Endoscopy;  Laterality: N/A;   excision of right axillary lipoma  02/2006   EYE SURGERY     FOOT SURGERY Right    HEMORRHOID SURGERY     HERNIA REPAIR  02/2006   NASAL SEPTUM SURGERY     Family History  Problem Relation Age of Onset   Heart disease Mother 38       heart attack   Cancer Father        lung   Diabetes Sister    Breast cancer Sister 93  Breast cancer Daughter 15   Stroke Brother    Diabetes Brother    Social History   Socioeconomic History   Marital status: Widowed    Spouse name: Not on file   Number of children: Not on file   Years of education: Not on file   Highest education level: Not on file  Occupational History   Not on file  Tobacco Use   Smoking status: Never    Passive exposure: Never   Smokeless tobacco: Never   Tobacco comments:    Never   Vaping Use   Vaping status: Never Used  Substance and Sexual Activity   Alcohol use: No    Alcohol/week: 0.0 standard drinks of alcohol   Drug use: No   Sexual activity: Not on file  Other Topics Concern   Not on file  Social History Narrative   Not on file   Social Drivers of Health   Financial Resource Strain: Not on file  Food Insecurity: Not on file  Transportation Needs: Not on file  Physical Activity: Not on file  Stress: Not on file  Social Connections: Not on file     Review of Systems     Objective:     There were no vitals taken for this visit. Wt Readings from Last 3 Encounters:  04/24/23 210 lb (95.3 kg)  03/01/23 211 lb (95.7 kg)  02/22/23 211 lb (95.7 kg)    Physical Exam  {Perform Simple Foot Exam  Perform Detailed exam:1} {Insert foot Exam (Optional):30965}   Outpatient Encounter Medications as of 06/11/2023  Medication Sig   albuterol (VENTOLIN HFA) 108 (90 Base) MCG/ACT inhaler Inhale 2 puffs into the lungs every 6 (six) hours as needed for wheezing or shortness of breath.   aspirin 81 MG EC tablet Take 81 mg by mouth daily. Swallow whole.   calcium carbonate (OSCAL) 1500 (600 Ca) MG TABS tablet Take by mouth 2 (two) times daily with a meal.   cholecalciferol (VITAMIN D3) 25 MCG (1000 UNIT) tablet Take 1,000 Units by mouth daily.   [facility] SLOW RELEASE IRON 143 (45 Fe) MG TBCR 1 TABLET DAILY   Fluticasone-Umeclidin-Vilant (TRELEGY ELLIPTA) 100-62.5-25 MCG/ACT AEPB Inhale 1 puff into the lungs daily.   iron dextran complex in sodium chloride 0.9 % 500 mL Inject into the vein once.   metoprolol succinate (TOPROL-XL) 25 MG 24 hr tablet Take 1 tablet (25 mg total) by mouth 2 (two) times daily.   montelukast (SINGULAIR) 10 MG tablet Take 1 tablet (10 mg total) by mouth at bedtime.   Multiple Vitamin (MULTIVITAMIN WITH MINERALS) TABS Take 1 tablet by mouth daily.   Omega-3 Fatty Acids (FISH OIL) 1000 MG CAPS Take 3,000 mg by mouth daily.   pantoprazole  (PROTONIX) 40 MG tablet Take 1 tablet (40 mg total) by mouth 2 (two) times daily before a meal.   rosuvastatin (CRESTOR) 10 MG tablet TAKE 1 TABLET DAILY   Facility-Administered Encounter Medications as of 06/11/2023  Medication   [START ON 06/18/2023] denosumab (PROLIA) injection 60 mg     Lab Results  Component Value Date   WBC 5.3 04/24/2023   HGB 12.7 04/24/2023   HCT 38.5 04/24/2023   PLT 280.0 04/24/2023   GLUCOSE 139 (H) 04/24/2023   CHOL 166 04/24/2023   TRIG 169.0 (H) 04/24/2023   HDL 75.90 04/24/2023   LDLCALC 57 04/24/2023   ALT 42 (H) 05/21/2023   AST 31 05/21/2023   NA 140 04/24/2023   K 4.3 04/24/2023  CL 103 04/24/2023   CREATININE 0.80 04/24/2023   BUN 18 04/24/2023   CO2 27 04/24/2023   TSH 2.09 10/30/2022   HGBA1C 6.8 (H) 04/24/2023   MICROALBUR <0.7 05/30/2022    MM 3D DIAGNOSTIC MAMMOGRAM BILATERAL BREAST Result Date: 05/14/2023 CLINICAL DATA:  83 year old female presenting for annual exam. She reports 1 episode of shooting sharp pain in the left breast, with radiation to the left nipple, during her annual physical exam. She has not experienced the pain since then. EXAM: DIGITAL DIAGNOSTIC BILATERAL MAMMOGRAM WITH TOMOSYNTHESIS AND CAD TECHNIQUE: Bilateral digital diagnostic mammography and breast tomosynthesis was performed. The images were evaluated with computer-aided detection. COMPARISON:  Previous exam(s). ACR Breast Density Category b: There are scattered areas of fibroglandular density. FINDINGS: No suspicious mass, calcification, or other findings are identified in bilateral breasts. IMPRESSION: 1. No mammographic evidence of malignancy in bilateral breasts. 2. No mammographic etiology for diffuse left breast pain is identified. RECOMMENDATION: Return to routine screening mammography is recommended. The patient will be due for screening in December 2025. I have discussed the findings and recommendations with the patient. If applicable, a reminder letter  will be sent to the patient regarding the next appointment. BI-RADS CATEGORY  1: Negative. Electronically Signed   By: Jacob Moores M.D.   On: 05/14/2023 10:06       Assessment & Plan:  There are no diagnoses linked to this encounter.   Dale Garber, MD

## 2023-06-11 ENCOUNTER — Other Ambulatory Visit: Payer: Medicare Other

## 2023-06-11 ENCOUNTER — Encounter: Payer: Self-pay | Admitting: Internal Medicine

## 2023-06-11 ENCOUNTER — Ambulatory Visit (INDEPENDENT_AMBULATORY_CARE_PROVIDER_SITE_OTHER): Payer: Medicare Other | Admitting: Internal Medicine

## 2023-06-11 DIAGNOSIS — J45909 Unspecified asthma, uncomplicated: Secondary | ICD-10-CM

## 2023-06-11 NOTE — Progress Notes (Signed)
[name], female   DOB: [birth year], 83 y.o.   MRN: [id number] Did not show for appt.

## 2023-06-14 ENCOUNTER — Ambulatory Visit: Payer: Medicare Other

## 2023-06-18 ENCOUNTER — Telehealth: Payer: Self-pay | Admitting: Internal Medicine

## 2023-06-18 ENCOUNTER — Ambulatory Visit: Payer: Medicare Other

## 2023-06-18 DIAGNOSIS — M810 Age-related osteoporosis without current pathological fracture: Secondary | ICD-10-CM

## 2023-06-18 MED ORDER — DENOSUMAB 60 MG/ML ~~LOC~~ SOSY
60.0000 mg | PREFILLED_SYRINGE | SUBCUTANEOUS | Status: AC
  Administered 2023-12-24 (×2): 60 mg via SUBCUTANEOUS

## 2023-06-18 NOTE — Progress Notes (Signed)
Pt presented for their subcutaneous Prolia injection. Pt was identified through two identifiers. Pt was given the information packets about the Prolia and told to schedule their next injection 6 months out. Pt tolerated the subq injection well in the right arm.  

## 2023-06-18 NOTE — Telephone Encounter (Signed)
Prescription Request  06/18/2023  LOV: 06/11/2023  What is the name of the medication or equipment? Ondansetron ODT 4 mg Tablet, prescribed by Edwena Bunde. Patient was wondering if Dr Lorin Picket would refill for her.  Have you contacted your pharmacy to request a refill? No   Which pharmacy would you like this sent to?  [id number] - [place], [place] - [address] [place] [address] Phone: [phone] Fax: [phone]   Patient notified that their request is being sent to the clinical staff for review and that they should receive a response within 2 business days.   Please advise at [facility] [phone]

## 2023-06-19 ENCOUNTER — Telehealth: Payer: Self-pay

## 2023-06-19 NOTE — Telephone Encounter (Signed)
 Called patient to see why she needs the zofran  refilled. She says sometimes when she eats she gets sick to her stomach but it is not persistent. It just happens every now and then so she likes to keep these on hand because they help to settle her stomach. Patient has not had refilled since last year and just used her last 2 tablets a few days ago. Ok to refill #30 for her to use prn? Pt says you are aware of all of this already.

## 2023-06-19 NOTE — Telephone Encounter (Signed)
 Copied from CRM [id number]. Topic: Clinical - Medication Question >> Jun 19, 2023 12:07 PM Taleah C wrote: Reason for CRM: pt called back to follow-up on her rx request from yesterday. I informed her that the message was routed to the clinic staff yesterday but I couldn't guarantee a response by today. She asked for another message to be sent back and for a callback. Please advise.

## 2023-06-19 NOTE — Telephone Encounter (Signed)
 See other message

## 2023-06-20 MED ORDER — ONDANSETRON HCL 4 MG PO TABS: 4.0000 mg | ORAL_TABLET | Freq: Two times a day (BID) | ORAL | 0 refills | Status: DC | PRN

## 2023-06-20 NOTE — Telephone Encounter (Signed)
 Patient aware of below. Patient scheduled,

## 2023-06-20 NOTE — Addendum Note (Signed)
 Addended by: Raejean Bullock on: 06/20/2023 04:06 AM   Modules accepted: Orders

## 2023-06-20 NOTE — Telephone Encounter (Signed)
 LM for patient. When she returns call please give her the message and schedule her for a follow up appointment with Dr Geralyn Knee.

## 2023-06-20 NOTE — Telephone Encounter (Signed)
 I have sent in rx for zofran  to have if needed. She is to let us  know if symptoms change or worsening symptoms.  Also needs to reschedule f/u appt.

## 2023-06-25 ENCOUNTER — Other Ambulatory Visit: Payer: Medicare Other

## 2023-07-02 ENCOUNTER — Other Ambulatory Visit: Payer: Medicare Other

## 2023-07-10 DIAGNOSIS — C801 Malignant (primary) neoplasm, unspecified: Secondary | ICD-10-CM

## 2023-07-10 HISTORY — DX: Malignant (primary) neoplasm, unspecified: C80.1

## 2023-07-23 ENCOUNTER — Ambulatory Visit: Payer: Self-pay | Admitting: Urology

## 2023-07-31 ENCOUNTER — Ambulatory Visit: Payer: Medicare Other | Admitting: Internal Medicine

## 2023-07-31 ENCOUNTER — Telehealth: Payer: Self-pay

## 2023-07-31 ENCOUNTER — Encounter: Payer: Self-pay | Admitting: Internal Medicine

## 2023-07-31 VITALS — BP 126/68 | HR 86 | Temp 98.0°F | Resp 16 | Ht 64.0 in | Wt 209.0 lb

## 2023-07-31 DIAGNOSIS — D509 Iron deficiency anemia, unspecified: Secondary | ICD-10-CM

## 2023-07-31 DIAGNOSIS — K219 Gastro-esophageal reflux disease without esophagitis: Secondary | ICD-10-CM

## 2023-07-31 DIAGNOSIS — M810 Age-related osteoporosis without current pathological fracture: Secondary | ICD-10-CM | POA: Diagnosis not present

## 2023-07-31 DIAGNOSIS — E1165 Type 2 diabetes mellitus with hyperglycemia: Secondary | ICD-10-CM | POA: Diagnosis not present

## 2023-07-31 DIAGNOSIS — E7800 Pure hypercholesterolemia, unspecified: Secondary | ICD-10-CM | POA: Diagnosis not present

## 2023-07-31 DIAGNOSIS — I10 Essential (primary) hypertension: Secondary | ICD-10-CM

## 2023-07-31 DIAGNOSIS — R1310 Dysphagia, unspecified: Secondary | ICD-10-CM

## 2023-07-31 DIAGNOSIS — J45909 Unspecified asthma, uncomplicated: Secondary | ICD-10-CM

## 2023-07-31 DIAGNOSIS — R739 Hyperglycemia, unspecified: Secondary | ICD-10-CM

## 2023-07-31 LAB — BASIC METABOLIC PANEL
BUN: 13 mg/dL (ref 6–23)
CO2: 25 meq/L (ref 19–32)
Calcium: 9.1 mg/dL (ref 8.4–10.5)
Chloride: 106 meq/L (ref 96–112)
Creatinine, Ser: 0.86 mg/dL (ref 0.40–1.20)
GFR: 63.28 mL/min (ref >60.00)
Glucose, Bld: 125 mg/dL — ABNORMAL HIGH (ref 70–99)
Potassium: 3.9 meq/L (ref 3.5–5.1)
Sodium: 141 meq/L (ref 135–145)

## 2023-07-31 LAB — CBC WITH DIFFERENTIAL/PLATELET
Basophils Absolute: 0.1 10*3/uL (ref 0.0–0.1)
Basophils Relative: 1.4 % (ref 0.0–3.0)
Eosinophils Absolute: 0.3 10*3/uL (ref 0.0–0.7)
Eosinophils Relative: 5.8 % — ABNORMAL HIGH (ref 0.0–5.0)
HCT: 28.9 % — ABNORMAL LOW (ref 36.0–46.0)
Hemoglobin: 9.4 g/dL — ABNORMAL LOW (ref 12.0–15.0)
Lymphocytes Relative: 20.1 % (ref 12.0–46.0)
Lymphs Abs: 1 10*3/uL (ref 0.7–4.0)
MCHC: 32.7 g/dL (ref 30.0–36.0)
MCV: 96.8 fl (ref 78.0–100.0)
Monocytes Absolute: 0.5 10*3/uL (ref 0.1–1.0)
Monocytes Relative: 10.4 % (ref 3.0–12.0)
Neutro Abs: 3.2 10*3/uL (ref 1.4–7.7)
Neutrophils Relative %: 62.3 % (ref 43.0–77.0)
Platelets: 292 10*3/uL (ref 150.0–400.0)
RBC: 2.99 Mil/uL — ABNORMAL LOW (ref 3.87–5.11)
RDW: 14 % (ref 11.5–15.5)
WBC: 5.1 10*3/uL (ref 4.0–10.5)

## 2023-07-31 LAB — HEPATIC FUNCTION PANEL
ALT: 25 U/L (ref 0–35)
AST: 24 U/L (ref 0–37)
Albumin: 4.2 g/dL (ref 3.5–5.2)
Alkaline Phosphatase: 70 U/L (ref 39–117)
Bilirubin, Direct: 0.1 mg/dL (ref 0.0–0.3)
Total Bilirubin: 0.3 mg/dL (ref 0.2–1.2)
Total Protein: 7.1 g/dL (ref 6.0–8.3)

## 2023-07-31 LAB — LIPID PANEL
Cholesterol: 163 mg/dL (ref 0–200)
HDL: 80.2 mg/dL (ref >39.00)
LDL Cholesterol: 53 mg/dL (ref 0–99)
NonHDL: 82.96
Total CHOL/HDL Ratio: 2
Triglycerides: 148 mg/dL (ref 0.0–149.0)
VLDL: 29.6 mg/dL (ref 0.0–40.0)

## 2023-07-31 LAB — MICROALBUMIN / CREATININE URINE RATIO
Creatinine,U: 61.1 mg/dL
Microalb Creat Ratio: UNDETERMINED mg/g (ref 0.0–30.0)
Microalb, Ur: <0.7 mg/dL

## 2023-07-31 LAB — TSH: TSH: 3.45 u[IU]/mL (ref 0.35–5.50)

## 2023-07-31 LAB — IBC + FERRITIN
Ferritin: 17.3 ng/mL (ref 10.0–291.0)
Iron: 13 ug/dL — ABNORMAL LOW (ref 42–145)
Saturation Ratios: 2.4 % — ABNORMAL LOW (ref 20.0–50.0)
TIBC: 532 ug/dL — ABNORMAL HIGH (ref 250.0–450.0)
Transferrin: 380 mg/dL — ABNORMAL HIGH (ref 212.0–360.0)

## 2023-07-31 LAB — VITAMIN D 25 HYDROXY (VIT D DEFICIENCY, FRACTURES): VITD: 56.46 ng/mL (ref 30.00–100.00)

## 2023-07-31 LAB — HEMOGLOBIN A1C: Hgb A1c MFr Bld: 6.5 % (ref 4.6–6.5)

## 2023-07-31 MED ORDER — TRAMADOL HCL 50 MG PO TABS: 50.0000 mg | ORAL_TABLET | Freq: Three times a day (TID) | ORAL | 0 refills | Status: AC | PRN

## 2023-07-31 MED ORDER — ONDANSETRON HCL 4 MG PO TABS: 4.0000 mg | ORAL_TABLET | Freq: Two times a day (BID) | ORAL | 0 refills | Status: AC | PRN

## 2023-07-31 NOTE — Telephone Encounter (Signed)
 Given that she is not scheduled until the end of April, she will need repeat labs 1-2 days prior.

## 2023-07-31 NOTE — Telephone Encounter (Signed)
 Received message from PCP Dr. Lorin Picket, stating that pt's iron labs were checked and were low. Dr Cathie Hoops agrees that she needs follow up for iron infusion.   Please schedule and notify pt of appt details:   MD/ +/- venofer

## 2023-07-31 NOTE — Progress Notes (Signed)
 Subjective:    [name], female    DOB: 23-Sep-1940, 83 y.o.   MRN: [id number]  Patient here for  Chief Complaint  Patient presents with   Medical Management of Chronic Issues    HPI Here for a scheduled follow up - f/u regrding diabetes and hypercholesterolemia. Sees Dr Jayme Cloud for moderate persistent asthma. CT chest showed no ILD, very large hiatal hernia with intrathoracic stomach. Continue Trelegy. She has severe issues with gastroesophageal reflux she has been noted to have esophageal dysmotility and a hiatal hernia that is at least 5 cm in size. Taking PPI, but still with increased issues with some increased mucus and regurgitation and intermittent nausea.  Seeing Dr Cathie Hoops - f/u iron deficiency anemia.  Last evaluated 02/19/23 - hgb improved.  Recommended iron weekly for maintenance. She is having some restless legs. Some increased fatigue. Wants to get her hgb and iron studies checked. Had f/u with Dr Sherryll Burger 05/28/23 - f/u regarding occipital neuralgia vs post concussive headache syndrome. Also concern for increased ICP. Headache returned 03/2023 - saw Dr Jenne Campus.  MRI scheduled for 08/20/23. Reports some increased dizziness with leaning forward and when lying down and looking up - at the ceiling. No chest pain. No increased sob reported. Had melanoma removed - left face. Followed by dermatology. F/u scheduled 08/06/23.    Past Medical History:  Diagnosis Date   Asthma    Fibrocystic breast disease    Gastritis    EGD - schatzki's ring, gastritis and gastric polyps   History of colon polyps    Hypercholesterolemia    Hypertension    MAI (mycobacterium avium-intracellulare) infection (HCC)    Osteoporosis    Reflux esophagitis    Past Surgical History:  Procedure Laterality Date   ABDOMINAL HYSTERECTOMY  1998   bladder tact     BREAST EXCISIONAL BIOPSY Bilateral 1970's   benign   BREAST EXCISIONAL BIOPSY Bilateral 2010   pt  stated dr Katrinka Blazing did bilat axilla lumps  neg    BREAST SURGERY     cyst(benign)   CATARACT EXTRACTION W/PHACO Left 06/13/2021   Procedure: CATARACT EXTRACTION PHACO AND INTRAOCULAR LENS PLACEMENT (IOC) LEFT;  Surgeon: Nevada Crane, MD;  Location: [facility];  Service: Ophthalmology;  Laterality: Left;  7.04 0:40.5   CATARACT EXTRACTION W/PHACO Right 06/27/2021   Procedure: CATARACT EXTRACTION PHACO AND INTRAOCULAR LENS PLACEMENT (IOC) RIGHT;  Surgeon: Nevada Crane, MD;  Location: [facility];  Service: Ophthalmology;  Laterality: Right;  3.32 0:30.7   COLONOSCOPY N/A 09/28/2014   Procedure: COLONOSCOPY;  Surgeon: Scot Jun, MD;  Location: [facility] ENDOSCOPY;  Service: Endoscopy;  Laterality: N/A;   COLONOSCOPY WITH PROPOFOL N/A 01/02/2022   Procedure: COLONOSCOPY WITH PROPOFOL;  Surgeon: Regis Bill, MD;  Location: [facility] ENDOSCOPY;  Service: Endoscopy;  Laterality: N/A;   ESOPHAGOGASTRODUODENOSCOPY N/A 09/28/2014   Procedure: ESOPHAGOGASTRODUODENOSCOPY (EGD);  Surgeon: Scot Jun, MD;  Location: [facility] ENDOSCOPY;  Service: Endoscopy;  Laterality: N/A;   ESOPHAGOGASTRODUODENOSCOPY (EGD) WITH PROPOFOL N/A 04/26/2016   Procedure: ESOPHAGOGASTRODUODENOSCOPY (EGD) WITH PROPOFOL;  Surgeon: Scot Jun, MD;  Location: [facility] ENDOSCOPY;  Service: Endoscopy;  Laterality: N/A;   ESOPHAGOGASTRODUODENOSCOPY (EGD) WITH PROPOFOL N/A 01/02/2022   Procedure: ESOPHAGOGASTRODUODENOSCOPY (EGD) WITH PROPOFOL;  Surgeon: Regis Bill, MD;  Location: [facility] ENDOSCOPY;  Service: Endoscopy;  Laterality: N/A;   excision of right axillary lipoma  02/2006   EYE SURGERY     FOOT SURGERY Right    HEMORRHOID  SURGERY     HERNIA REPAIR  02/2006   NASAL SEPTUM SURGERY     Family History  Problem Relation Age of Onset   Heart disease Mother 47       heart attack   Cancer Father        lung   Diabetes Sister    Breast cancer Sister 69   Breast cancer Daughter 72   Stroke Brother    Diabetes Brother     Social History   Socioeconomic History   Marital status: Widowed    Spouse name: Not on file   Number of children: Not on file   Years of education: Not on file   Highest education level: Not on file  Occupational History   Not on file  Tobacco Use   Smoking status: Never    Passive exposure: Never   Smokeless tobacco: Never   Tobacco comments:    Never  Vaping Use   Vaping status: Never Used  Substance and Sexual Activity   Alcohol use: No    Alcohol/week: 0.0 standard drinks of alcohol   Drug use: No   Sexual activity: Not on file  Other Topics Concern   Not on file  Social History Narrative   Not on file   Social Drivers of Health   Financial Resource Strain: Not on file  Food Insecurity: Not on file  Transportation Needs: Not on file  Physical Activity: Not on file  Stress: Not on file  Social Connections: Not on file     Review of Systems  Constitutional:  Negative for appetite change and unexpected weight change.  HENT:  Negative for congestion and sinus pressure.   Respiratory:  Negative for cough, chest tightness and shortness of breath.   Cardiovascular:  Negative for chest pain, palpitations and leg swelling.  Gastrointestinal:  Negative for vomiting.       GI symptoms as outlined.   Genitourinary:  Negative for difficulty urinating and dysuria.  Musculoskeletal:  Negative for joint swelling and myalgias.  Skin:  Negative for color change and rash.  Neurological:  Negative for headaches.       Dizziness as outlined.   Psychiatric/Behavioral:  Negative for agitation and dysphoric mood.        Objective:     BP 126/68   Pulse 86   Temp 98 F (36.7 C)   Resp 16   Ht 5\' 4"  (1.626 m)   Wt 209 lb (94.8 kg)   SpO2 98%   BMI 35.87 kg/m  Wt Readings from Last 3 Encounters:  07/31/23 209 lb (94.8 kg)  04/24/23 210 lb (95.3 kg)  03/01/23 211 lb (95.7 kg)    Physical Exam Vitals reviewed.  Constitutional:      General: She is not in acute  distress.    Appearance: Normal appearance.  HENT:     Head: Normocephalic and atraumatic.     Right Ear: External ear normal.     Left Ear: External ear normal.     Mouth/Throat:     Pharynx: No oropharyngeal exudate or posterior oropharyngeal erythema.  Eyes:     General: No scleral icterus.       Right eye: No discharge.        Left eye: No discharge.     Conjunctiva/sclera: Conjunctivae normal.  Neck:     Thyroid: No thyromegaly.  Cardiovascular:     Rate and Rhythm: Normal rate and regular rhythm.  Pulmonary:     Effort:  No respiratory distress.     Breath sounds: Normal breath sounds. No wheezing.  Abdominal:     General: Bowel sounds are normal.     Palpations: Abdomen is soft.     Tenderness: There is no abdominal tenderness.  Musculoskeletal:        General: No swelling or tenderness.     Cervical back: Neck supple. No tenderness.  Lymphadenopathy:     Cervical: No cervical adenopathy.  Skin:    Findings: No erythema or rash.  Neurological:     Mental Status: She is alert.  Psychiatric:        Mood and Affect: Mood normal.        Behavior: Behavior normal.         Outpatient Encounter Medications as of 07/31/2023  Medication Sig   traMADol (ULTRAM) 50 MG tablet Take 1 tablet (50 mg total) by mouth every 8 (eight) hours as needed for up to 5 days.   albuterol (VENTOLIN HFA) 108 (90 Base) MCG/ACT inhaler Inhale 2 puffs into the lungs every 6 (six) hours as needed for wheezing or shortness of breath.   aspirin 81 MG EC tablet Take 81 mg by mouth daily. Swallow whole.   calcium carbonate (OSCAL) 1500 (600 Ca) MG TABS tablet Take by mouth 2 (two) times daily with a meal.   cholecalciferol (VITAMIN D3) 25 MCG (1000 UNIT) tablet Take 1,000 Units by mouth daily.   [employer] SLOW RELEASE IRON 143 (45 Fe) MG TBCR 1 TABLET DAILY   Fluticasone-Umeclidin-Vilant (TRELEGY ELLIPTA) 100-62.5-25 MCG/ACT AEPB Inhale 1 puff into the lungs daily.   iron dextran complex in sodium  chloride 0.9 % 500 mL Inject into the vein once.   metoprolol succinate (TOPROL-XL) 25 MG 24 hr tablet Take 1 tablet (25 mg total) by mouth 2 (two) times daily.   montelukast (SINGULAIR) 10 MG tablet Take 1 tablet (10 mg total) by mouth at bedtime.   Multiple Vitamin (MULTIVITAMIN WITH MINERALS) TABS Take 1 tablet by mouth daily.   Omega-3 Fatty Acids (FISH OIL) 1000 MG CAPS Take 3,000 mg by mouth daily.   ondansetron (ZOFRAN) 4 MG tablet Take 1 tablet (4 mg total) by mouth 2 (two) times daily as needed for nausea or vomiting.   pantoprazole (PROTONIX) 40 MG tablet Take 1 tablet (40 mg total) by mouth 2 (two) times daily before a meal.   rosuvastatin (CRESTOR) 10 MG tablet TAKE 1 TABLET DAILY   [DISCONTINUED] ondansetron (ZOFRAN) 4 MG tablet Take 1 tablet (4 mg total) by mouth 2 (two) times daily as needed for nausea or vomiting.   Facility-Administered Encounter Medications as of 07/31/2023  Medication   [START ON 12/17/2023] denosumab (PROLIA) injection 60 mg     Lab Results  Component Value Date   WBC 5.1 07/31/2023   HGB 9.4 (L) 07/31/2023   HCT 28.9 (L) 07/31/2023   PLT 292.0 07/31/2023   GLUCOSE 125 (H) 07/31/2023   CHOL 163 07/31/2023   TRIG 148.0 07/31/2023   HDL 80.20 07/31/2023   LDLCALC 53 07/31/2023   ALT 25 07/31/2023   AST 24 07/31/2023   NA 141 07/31/2023   K 3.9 07/31/2023   CL 106 07/31/2023   CREATININE 0.86 07/31/2023   BUN 13 07/31/2023   CO2 25 07/31/2023   TSH 3.45 07/31/2023   HGBA1C 6.5 07/31/2023   MICROALBUR <0.7 07/31/2023    MM 3D DIAGNOSTIC MAMMOGRAM BILATERAL BREAST Result Date: 05/14/2023 CLINICAL DATA:  83 year old female presenting for annual exam. She  reports 1 episode of shooting sharp pain in the left breast, with radiation to the left nipple, during her annual physical exam. She has not experienced the pain since then. EXAM: DIGITAL DIAGNOSTIC BILATERAL MAMMOGRAM WITH TOMOSYNTHESIS AND CAD TECHNIQUE: Bilateral digital diagnostic mammography  and breast tomosynthesis was performed. The images were evaluated with computer-aided detection. COMPARISON:  Previous exam(s). ACR Breast Density Category b: There are scattered areas of fibroglandular density. FINDINGS: No suspicious mass, calcification, or other findings are identified in bilateral breasts. IMPRESSION: 1. No mammographic evidence of malignancy in bilateral breasts. 2. No mammographic etiology for diffuse left breast pain is identified. RECOMMENDATION: Return to routine screening mammography is recommended. The patient will be due for screening in December 2025. I have discussed the findings and recommendations with the patient. If applicable, a reminder letter will be sent to the patient regarding the next appointment. BI-RADS CATEGORY  1: Negative. Electronically Signed   By: Jacob Moores M.D.   On: 05/14/2023 10:06       Assessment & Plan:  Type 2 diabetes mellitus with hyperglycemia, without long-term current use of insulin (HCC) Assessment & Plan: Low carb diet and exercise.  Follow met b and a1c.    Orders: -     Basic metabolic panel -     Hemoglobin A1c -     Microalbumin / creatinine urine ratio  Osteoporosis without current pathological fracture, unspecified osteoporosis type Assessment & Plan: Prolia 06/18/23.  Orders: -     VITAMIN D 25 Hydroxy (Vit-D Deficiency, Fractures)  Hypercholesterolemia Assessment & Plan: Continue Crestor.  Low-cholesterol diet and exercise.  Follow-up fast lipid profile liver panel.  Orders: -     Hepatic function panel -     Lipid panel -     TSH  Iron deficiency anemia, unspecified iron deficiency anemia type Assessment & Plan: Dr Cathie Hoops - f/u iron deficiency anemia.  Last evaluated 02/19/23 - hgb improved.  Recommended iron weekly for maintenance. Check cbc and iron studies today.   Orders: -     CBC with Differential/Platelet -     IBC + Ferritin  Gastroesophageal reflux disease, unspecified whether esophagitis  present Assessment & Plan: She has severe issues with gastroesophageal reflux she has been noted to have esophageal dysmotility and a hiatal hernia that is at least 5 cm in size. Taking PPI, but still with increased issues with some increased mucus and regurgitation and intermittent nausea. Given persistent problems, refer to GI for further evaluation.   Orders: -     Ambulatory referral to Gastroenterology  Uncomplicated asthma, unspecified asthma severity, unspecified whether persistent Assessment & Plan: Sees Dr Jayme Cloud for moderate persistent asthma. CT chest showed no ILD, very large hiatal hernia with intrathoracic stomach. Continue Trelegy.    Dysphagia, unspecified type Assessment & Plan: She has severe issues with gastroesophageal reflux she has been noted to have esophageal dysmotility and a hiatal hernia that is at least 5 cm in size. Taking PPI, but still with increased issues with some increased mucus and regurgitation and intermittent nausea.   Orders: -     Ambulatory referral to Gastroenterology  Essential hypertension, benign Assessment & Plan: Blood pressure as outlined.  On metoprolol.  Continue current medication. Follow pressures. Follow metabolic panel.    Hyperglycemia Assessment & Plan: Low-carb diet and exercise.  Follow metabolic panel and A1c.   Other orders -     Ondansetron HCl; Take 1 tablet (4 mg total) by mouth 2 (two) times daily as needed  for nausea or vomiting.  Dispense: 30 tablet; Refill: 0 -     traMADol HCl; Take 1 tablet (50 mg total) by mouth every 8 (eight) hours as needed for up to 5 days.  Dispense: 15 tablet; Refill: 0     Dale , MD

## 2023-08-01 NOTE — Telephone Encounter (Unsigned)
 Copied from CRM [id number]. Topic: General - Other >> Aug 01, 2023  2:44 PM Chantha C wrote: Reason for CRM: Patient returning the office/Trisha's call, states no messages was left. Patient states she got her test results, and trying to figure out why the office called. Per CAL, Bethann Berkshire is at lunch, send a message. Please call back at [phone].

## 2023-08-02 ENCOUNTER — Encounter: Payer: Self-pay | Admitting: Oncology

## 2023-08-02 ENCOUNTER — Other Ambulatory Visit: Payer: Self-pay

## 2023-08-02 DIAGNOSIS — D508 Other iron deficiency anemias: Secondary | ICD-10-CM

## 2023-08-05 ENCOUNTER — Encounter: Payer: Self-pay | Admitting: Internal Medicine

## 2023-08-05 NOTE — Assessment & Plan Note (Signed)
 Blood pressure as outlined.  On metoprolol.  Continue current medication. Follow pressures. Follow metabolic panel.

## 2023-08-05 NOTE — Assessment & Plan Note (Signed)
Low-carb diet and exercise.  Follow metabolic panel and A1c. 

## 2023-08-05 NOTE — Assessment & Plan Note (Signed)
 She has severe issues with gastroesophageal reflux she has been noted to have esophageal dysmotility and a hiatal hernia that is at least 5 cm in size. Taking PPI, but still with increased issues with some increased mucus and regurgitation and intermittent nausea. Given persistent problems, refer to GI for further evaluation.

## 2023-08-05 NOTE — Assessment & Plan Note (Signed)
 Prolia 06/18/23.

## 2023-08-05 NOTE — Assessment & Plan Note (Signed)
 Sees Dr Jayme Cloud for moderate persistent asthma. CT chest showed no ILD, very large hiatal hernia with intrathoracic stomach. Continue Trelegy.

## 2023-08-05 NOTE — Assessment & Plan Note (Signed)
 She has severe issues with gastroesophageal reflux she has been noted to have esophageal dysmotility and a hiatal hernia that is at least 5 cm in size. Taking PPI, but still with increased issues with some increased mucus and regurgitation and intermittent nausea.

## 2023-08-05 NOTE — Assessment & Plan Note (Signed)
Continue Crestor.  Low-cholesterol diet and exercise.  Follow-up fast lipid profile liver panel.

## 2023-08-05 NOTE — Assessment & Plan Note (Signed)
 Dr Cathie Hoops - f/u iron deficiency anemia.  Last evaluated 02/19/23 - hgb improved.  Recommended iron weekly for maintenance. Check cbc and iron studies today.

## 2023-08-05 NOTE — Assessment & Plan Note (Signed)
 Low carb diet and exercise.  Follow met b and a1c.

## 2023-08-20 ENCOUNTER — Ambulatory Visit
Admission: RE | Admit: 2023-08-20 | Discharge: 2023-08-20 | Disposition: A | Discharge status: Home / Self Care | Payer: Medicare Other | Source: Ambulatory Visit | Attending: Unknown Physician Specialty | Admitting: Unknown Physician Specialty

## 2023-08-20 DIAGNOSIS — R519 Headache, unspecified: Secondary | ICD-10-CM

## 2023-08-20 MED ORDER — GADOPICLENOL 0.5 MMOL/ML IV SOLN
10.0000 mL | Freq: Once | INTRAVENOUS | Status: AC | PRN
  Administered 2023-08-20: 10 mL via INTRAVENOUS

## 2023-08-27 ENCOUNTER — Ambulatory Visit: Admitting: Oncology

## 2023-08-27 ENCOUNTER — Ambulatory Visit

## 2023-09-03 ENCOUNTER — Inpatient Hospital Stay: Attending: Oncology

## 2023-09-03 DIAGNOSIS — D509 Iron deficiency anemia, unspecified: Secondary | ICD-10-CM | POA: Diagnosis present

## 2023-09-03 DIAGNOSIS — D508 Other iron deficiency anemias: Secondary | ICD-10-CM

## 2023-09-03 LAB — CBC WITH DIFFERENTIAL (CANCER CENTER ONLY)
Abs Immature Granulocytes: 0.02 10*3/uL (ref 0.00–0.07)
Basophils Absolute: 0.1 10*3/uL (ref 0.0–0.1)
Basophils Relative: 2 %
Eosinophils Absolute: 0.4 10*3/uL (ref 0.0–0.5)
Eosinophils Relative: 9 %
HCT: 38 % (ref 36.0–46.0)
Hemoglobin: 11.8 g/dL — ABNORMAL LOW (ref 12.0–15.0)
Immature Granulocytes: 1 %
Lymphocytes Relative: 25 %
Lymphs Abs: 1 10*3/uL (ref 0.7–4.0)
MCH: 31.6 pg (ref 26.0–34.0)
MCHC: 31.1 g/dL (ref 30.0–36.0)
MCV: 101.9 fL — ABNORMAL HIGH (ref 80.0–100.0)
Monocytes Absolute: 0.4 10*3/uL (ref 0.1–1.0)
Monocytes Relative: 11 %
Neutro Abs: 2.1 10*3/uL (ref 1.7–7.7)
Neutrophils Relative %: 52 %
Platelet Count: 258 10*3/uL (ref 150–400)
RBC: 3.73 MIL/uL — ABNORMAL LOW (ref 3.87–5.11)
RDW: 14.6 % (ref 11.5–15.5)
WBC Count: 4 10*3/uL (ref 4.0–10.5)
nRBC: 0 % (ref 0.0–0.2)

## 2023-09-03 LAB — IRON AND TIBC
Iron: 105 ug/dL (ref 28–170)
Saturation Ratios: 24 % (ref 10.4–31.8)
TIBC: 437 ug/dL (ref 250–450)
UIBC: 332 ug/dL

## 2023-09-03 LAB — FERRITIN: Ferritin: 40 ng/mL (ref 11–307)

## 2023-09-10 ENCOUNTER — Inpatient Hospital Stay

## 2023-09-10 ENCOUNTER — Encounter: Payer: Self-pay | Admitting: Oncology

## 2023-09-10 ENCOUNTER — Inpatient Hospital Stay (HOSPITAL_BASED_OUTPATIENT_CLINIC_OR_DEPARTMENT_OTHER): Admitting: Oncology

## 2023-09-10 VITALS — BP 147/79 | HR 83 | Temp 96.4°F | Resp 18 | Wt 209.4 lb

## 2023-09-10 DIAGNOSIS — D508 Other iron deficiency anemias: Secondary | ICD-10-CM

## 2023-09-10 DIAGNOSIS — D509 Iron deficiency anemia, unspecified: Secondary | ICD-10-CM | POA: Diagnosis not present

## 2023-09-10 NOTE — Progress Notes (Signed)
 Patient tried Hematology/Oncology Consult note [phone] [phone]      Patient Care Team: Dellar Fenton, MD as PCP - General (Internal Medicine) Constancia Delton, MD as PCP - Cardiology (Cardiology) Timmy Forbes, MD as Consulting Physician (Oncology)   REFERRING PROVIDER: Dellar Fenton, MD  CHIEF COMPLAINTS/REASON FOR VISIT:  Anemia  ASSESSMENT & PLAN:  IDA (iron  deficiency anemia) Reviewed labs with patient.   Lab Results  Component Value Date   HGB 11.8 (L) 09/03/2023   TIBC 437 09/03/2023   IRONPCTSAT 24 09/03/2023   FERRITIN 40 09/03/2023     Hemoglobin is stable no need for additional IV venofer . Continue   ferrous sulfate or iron  supplementation.   Orders Placed This Encounter  Procedures   CBC with Differential (Cancer Center Only)    Standing Status:   Future    Expected Date:   01/10/2024    Expiration Date:   09/09/2024   Retic Panel    Standing Status:   Future    Expected Date:   01/10/2024    Expiration Date:   09/09/2024   Ferritin    Standing Status:   Future    Expected Date:   01/10/2024    Expiration Date:   09/09/2024   Iron  and TIBC    Standing Status:   Future    Expected Date:   01/10/2024    Expiration Date:   09/09/2024   Vitamin B12    Standing Status:   Future    Expected Date:   01/10/2024    Expiration Date:   09/09/2024   Follow up in 6 months.  All questions were answered. The patient knows to call the clinic with any problems, questions or concerns.  Timmy Forbes, MD, PhD [facility] Hematology Oncology 09/10/2023     HISTORY OF PRESENTING ILLNESS:  Angela Cohen is a  83 y.o.  female with PMH listed below who was referred to me for anemia Reviewed patient's recent labs that was done.  She was found to have abnormal CBC on 12/05/2021, hemoglobin 7.6, this dropped from 9.2  2 weeks prior.  12/05/2021, saturation 2.5, ferritin 12.5, TIBC 595 Reviewed patient's previous labs ordered by primary care  physician's office,  Anemia is new onset.  Patient has a normal hemoglobin of 13  Patient reports feeling very tired, dizziness.  Shortness of breath with exertion. She denies recent chest pain on exertion,  pre-syncopal episodes, or palpitations She had not noticed any recent bleeding such as epistaxis, hematuria.  Occasionally she sees bright red blood in stool.  She has noticed darker stool for brief period of time in the past, now normal. Her primary care provider talk to them and have advised patient to start Slow Fe with vitamin C supplementation while waiting for an appointment to establish with hematology.  Repeat CBC on 12/08/2021, hemoglobin was 7.7. Her last colonoscopy was 09/28/2014 -internal hemorrhoids 04/26/2016, EGD showed gastritis.  01/02/22 upper endoscopy showed LA Grade B reflux esophagitis with no bleeding, - 5 cm hiatal hernia with a few Cameron ulcers.- Erythematous mucosa in the antrum. Biopsy showed erosive gastritis Colonoscopy 2mm polyp   INTERVAL HISTORY Angela Cohen is a 83 y.o. female who has above history reviewed by me today presents for follow up visit for iron  deficiency anemia. Patient developed iron  deficiency anemia, hemoglobin dropped in March 2025.  At that time she was taking over-the-counter vinegar pills which has caused stomach discomfort.  She stopped taking vinegar pills and started on  oral iron  supplementation. Stomach discomfort has resolved.  No other new complaints.  Denies any blood in the stool.   MEDICAL HISTORY:  Past Medical History:  Diagnosis Date   Asthma    Fibrocystic breast disease    Gastritis    EGD - schatzki's ring, gastritis and gastric polyps   History of colon polyps    Hypercholesterolemia    Hypertension    MAI (mycobacterium avium-intracellulare) infection (HCC)    Osteoporosis    Reflux esophagitis     SURGICAL HISTORY: Past Surgical History:  Procedure Laterality Date   ABDOMINAL HYSTERECTOMY  1998    bladder tact     BREAST EXCISIONAL BIOPSY Bilateral 1970's   benign   BREAST EXCISIONAL BIOPSY Bilateral 2010   pt  stated dr Felipe Horton did bilat axilla lumps neg    BREAST SURGERY     cyst(benign)   CATARACT EXTRACTION W/PHACO Left 06/13/2021   Procedure: CATARACT EXTRACTION PHACO AND INTRAOCULAR LENS PLACEMENT (IOC) LEFT;  Surgeon: Rosa College, MD;  Location: [facility];  Service: Ophthalmology;  Laterality: Left;  7.04 0:40.5   CATARACT EXTRACTION W/PHACO Right 06/27/2021   Procedure: CATARACT EXTRACTION PHACO AND INTRAOCULAR LENS PLACEMENT (IOC) RIGHT;  Surgeon: Rosa College, MD;  Location: [facility];  Service: Ophthalmology;  Laterality: Right;  3.32 0:30.7   COLONOSCOPY N/A 09/28/2014   Procedure: COLONOSCOPY;  Surgeon: Cassie Click, MD;  Location: [facility] ENDOSCOPY;  Service: Endoscopy;  Laterality: N/A;   COLONOSCOPY WITH PROPOFOL  N/A 01/02/2022   Procedure: COLONOSCOPY WITH PROPOFOL ;  Surgeon: Shane Darling, MD;  Location: [facility] ENDOSCOPY;  Service: Endoscopy;  Laterality: N/A;   ESOPHAGOGASTRODUODENOSCOPY N/A 09/28/2014   Procedure: ESOPHAGOGASTRODUODENOSCOPY (EGD);  Surgeon: Cassie Click, MD;  Location: [facility] ENDOSCOPY;  Service: Endoscopy;  Laterality: N/A;   ESOPHAGOGASTRODUODENOSCOPY (EGD) WITH PROPOFOL  N/A 04/26/2016   Procedure: ESOPHAGOGASTRODUODENOSCOPY (EGD) WITH PROPOFOL ;  Surgeon: Cassie Click, MD;  Location: [facility] ENDOSCOPY;  Service: Endoscopy;  Laterality: N/A;   ESOPHAGOGASTRODUODENOSCOPY (EGD) WITH PROPOFOL  N/A 01/02/2022   Procedure: ESOPHAGOGASTRODUODENOSCOPY (EGD) WITH PROPOFOL ;  Surgeon: Shane Darling, MD;  Location: [facility] ENDOSCOPY;  Service: Endoscopy;  Laterality: N/A;   excision of right axillary lipoma  02/2006   EYE SURGERY     FOOT SURGERY Right    HEMORRHOID SURGERY     HERNIA REPAIR  02/2006   NASAL SEPTUM SURGERY      SOCIAL HISTORY: Social History   Socioeconomic History   Marital status: Widowed     Spouse name: Not on file   Number of children: Not on file   Years of education: Not on file   Highest education level: Not on file  Occupational History   Not on file  Tobacco Use   Smoking status: Never    Passive exposure: Never   Smokeless tobacco: Never   Tobacco comments:    Never  Vaping Use   Vaping status: Never Used  Substance and Sexual Activity   Alcohol use: No    Alcohol/week: 0.0 standard drinks of alcohol   Drug use: No   Sexual activity: Not on file  Other Topics Concern   Not on file  Social History Narrative   Not on file   Social Drivers of Health   Financial Resource Strain: Not on file  Food Insecurity: Not on file  Transportation Needs: Not on file  Physical Activity: Not on file  Stress: Not on file  Social Connections: Not on file  Intimate Partner Violence: Not  on file    FAMILY HISTORY: Family History  Problem Relation Age of Onset   Heart disease Mother 74       heart attack   Cancer Father        lung   Diabetes Sister    Breast cancer Sister 17   Breast cancer Daughter 15   Stroke Brother    Diabetes Brother     ALLERGIES:  is allergic to [employer] [mirabegron].  MEDICATIONS:  Current Outpatient Medications  Medication Sig Dispense Refill   albuterol  (VENTOLIN  HFA) 108 (90 Base) MCG/ACT inhaler Inhale 2 puffs into the lungs every 6 (six) hours as needed for wheezing or shortness of breath. 3 each 1   aspirin 81 MG EC tablet Take 81 mg by mouth daily. Swallow whole.     calcium  carbonate (OSCAL) 1500 (600 Ca) MG TABS tablet Take by mouth 2 (two) times daily with a meal.     cholecalciferol (VITAMIN D3) 25 MCG (1000 UNIT) tablet Take 1,000 Units by mouth daily.     [facility] SLOW RELEASE IRON  143 (45 Fe) MG TBCR 1 TABLET DAILY 90 tablet 2   Fluticasone -Umeclidin-Vilant (TRELEGY ELLIPTA ) 100-62.5-25 MCG/ACT AEPB Inhale 1 puff into the lungs daily. 180 each 3   iron  dextran complex in sodium chloride  0.9 % 500 mL Inject into the vein  once.     metoprolol  succinate (TOPROL -XL) 25 MG 24 hr tablet Take 1 tablet (25 mg total) by mouth 2 (two) times daily. 180 tablet 1   montelukast  (SINGULAIR ) 10 MG tablet Take 1 tablet (10 mg total) by mouth at bedtime. 90 tablet 3   Multiple Vitamin (MULTIVITAMIN WITH MINERALS) TABS Take 1 tablet by mouth daily.     Omega-3 Fatty Acids (FISH OIL) 1000 MG CAPS Take 3,000 mg by mouth daily.     ondansetron  (ZOFRAN ) 4 MG tablet Take 1 tablet (4 mg total) by mouth 2 (two) times daily as needed for nausea or vomiting. 30 tablet 0   pantoprazole  (PROTONIX ) 40 MG tablet Take 1 tablet (40 mg total) by mouth 2 (two) times daily before a meal. 180 tablet 1   rosuvastatin  (CRESTOR ) 10 MG tablet TAKE 1 TABLET DAILY 90 tablet 3   Current Facility-Administered Medications  Medication Dose Route Frequency Provider Last Rate Last Admin   [START ON 12/17/2023] denosumab  (PROLIA ) injection 60 mg  60 mg Subcutaneous Q6 months Dellar Fenton, MD        Review of Systems  Constitutional:  Positive for fatigue. Negative for appetite change, chills and fever.  HENT:   Negative for hearing loss and voice change.   Eyes:  Negative for eye problems.  Respiratory:  Negative for chest tightness and cough.   Cardiovascular:  Negative for chest pain.  Gastrointestinal:  Negative for abdominal distention, abdominal pain and blood in stool.  Endocrine: Negative for hot flashes.  Genitourinary:  Negative for difficulty urinating and frequency.   Musculoskeletal:  Negative for arthralgias.  Skin:  Negative for itching and rash.  Neurological:  Negative for extremity weakness.  Hematological:  Negative for adenopathy.  Psychiatric/Behavioral:  Negative for confusion.     PHYSICAL EXAMINATION: ECOG PERFORMANCE STATUS: 1 - Symptomatic but completely ambulatory Vitals:   09/10/23 1310 09/10/23 1319  BP: (!) 157/91 (!) 147/79  Pulse: 83   Resp: 18   Temp: (!) 96.4 F (35.8 C)   SpO2: 95%    Filed Weights    09/10/23 1310  Weight: 209 lb 6.4 oz (95 kg)  Physical Exam Constitutional:      General: She is not in acute distress. HENT:     Head: Normocephalic and atraumatic.  Eyes:     General: No scleral icterus. Cardiovascular:     Rate and Rhythm: Normal rate and regular rhythm.     Heart sounds: Normal heart sounds.  Pulmonary:     Effort: Pulmonary effort is normal. No respiratory distress.     Breath sounds: No wheezing.  Abdominal:     General: Bowel sounds are normal. There is no distension.     Palpations: Abdomen is soft.  Musculoskeletal:        General: No deformity. Normal range of motion.     Cervical back: Normal range of motion and neck supple.  Skin:    General: Skin is warm and dry.     Coloration: Skin is not pale.     Findings: No erythema or rash.  Neurological:     Mental Status: She is alert and oriented to person, place, and time. Mental status is at baseline.     Cranial Nerves: No cranial nerve deficit.     Coordination: Coordination normal.  Psychiatric:        Mood and Affect: Mood normal.      LABORATORY DATA:  I have reviewed the data as listed    Latest Ref Rng & Units 09/03/2023   11:04 AM 07/31/2023    8:35 AM 04/24/2023    9:20 AM  CBC  WBC 4.0 - 10.5 K/uL 4.0  5.1  5.3   Hemoglobin 12.0 - 15.0 g/dL 54.0  9.4  98.1   Hematocrit 36.0 - 46.0 % 38.0  28.9  38.5   Platelets 150 - 400 K/uL 258  292.0  280.0       Latest Ref Rng & Units 07/31/2023    8:35 AM 05/21/2023   10:38 AM 04/24/2023    9:20 AM  CMP  Glucose 70 - 99 mg/dL 191   478   BUN 6 - 23 mg/dL 13   18   Creatinine 2.95 - 1.20 mg/dL 6.21   3.08   Sodium 657 - 145 mEq/L 141   140   Potassium 3.5 - 5.1 mEq/L 3.9   4.3   Chloride 96 - 112 mEq/L 106   103   CO2 19 - 32 mEq/L 25   27   Calcium  8.4 - 10.5 mg/dL 9.1   8.9   Total Protein 6.0 - 8.3 g/dL 7.1  7.3  7.4   Total Bilirubin 0.2 - 1.2 mg/dL 0.3  0.4  0.4   Alkaline Phos 39 - 117 U/L 70  85  67   AST 0 - 37 U/L 24  31   29    ALT 0 - 35 U/L 25  42  39       Component Value Date/Time   IRON  105 09/03/2023 1104   TIBC 437 09/03/2023 1104   FERRITIN 40 09/03/2023 1104   IRONPCTSAT 24 09/03/2023 1104     RADIOGRAPHIC STUDIES: I have personally reviewed the radiological images as listed and agreed with the findings in the report. MR BRAIN W WO CONTRAST Result Date: 09/03/2023 CLINICAL DATA:  Dizziness. Recent removal of melanoma from the face. Fall previously in 2023. patient also has a known meningioma. EXAM: MRI HEAD WITHOUT AND WITH CONTRAST TECHNIQUE: Multiplanar, multiecho pulse sequences of the brain and surrounding structures were obtained without and with intravenous contrast. CONTRAST:  8 cc Vueway  COMPARISON:  11/30/2021 FINDINGS: Brain: Diffusion imaging does not show any acute or subacute infarction or other cause of restricted diffusion. No focal abnormality affects the brainstem. There is an old small vessel infarction of the right posterior cerebellum. Cerebral hemispheres show moderate to severe chronic small-vessel ischemic change of the deep and subcortical white matter. No cortical or large vessel territory stroke. No intra-axial mass lesion, hemorrhage, hydrocephalus or extra-axial collection. Planum sphenoidale meningioma measures only 15 x 9 x 4 mm and does not have any significant mass-effect upon the brain. No measurable enlargement since the study of 2023. No evidence of metastatic disease. Vascular: Major vessels at the base of the brain show flow. Skull and upper cervical spine: Negative Sinuses/Orbits: Clear/normal Other: None IMPRESSION: 1. No acute or reversible finding. 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 Moderate to severe chronic small-vessel ischemic change of the cerebral hemispheric white matter. 2. Planum sphenoidale meningioma measures only 15 x 9 x 4 mm and does not have any significant mass-effect upon the brain. No measurable enlargement since the study of  2023. 3. No evidence of metastatic disease. Electronically Signed   By: Bettylou Brunner M.D.   On: 09/03/2023 17:47

## 2023-09-10 NOTE — Assessment & Plan Note (Addendum)
 Reviewed labs with patient.   Lab Results  Component Value Date   HGB 11.8 (L) 09/03/2023   TIBC 437 09/03/2023   IRONPCTSAT 24 09/03/2023   FERRITIN 40 09/03/2023     Hemoglobin is stable no need for additional IV venofer . Continue   ferrous sulfate or iron  supplementation.

## 2023-09-24 ENCOUNTER — Ambulatory Visit (INDEPENDENT_AMBULATORY_CARE_PROVIDER_SITE_OTHER): Payer: Medicare Other | Admitting: Urology

## 2023-09-24 VITALS — BP 142/60 | Ht 64.0 in | Wt 208.1 lb

## 2023-09-24 DIAGNOSIS — N3946 Mixed incontinence: Secondary | ICD-10-CM | POA: Diagnosis not present

## 2023-09-24 LAB — URINALYSIS, COMPLETE
Bilirubin, UA: NEGATIVE
Glucose, UA: NEGATIVE
Ketones, UA: NEGATIVE
Leukocytes,UA: NEGATIVE
Nitrite, UA: NEGATIVE
Protein,UA: NEGATIVE
Specific Gravity, UA: 1.025 (ref 1.005–1.030)
Urobilinogen, Ur: 0.2 mg/dL (ref 0.2–1.0)
pH, UA: 6 (ref 5.0–7.5)

## 2023-09-24 LAB — MICROSCOPIC EXAMINATION

## 2023-09-24 NOTE — Progress Notes (Addendum)
 09/24/2023 10:26 AM   Angela Cohen 01-04-1941 [id number]  Referring provider: Dellar Fenton, MD [address] [place],  [place] [address]  Chief Complaint  Patient presents with   Urinary Incontinence    HPI: Reviewed chart Discussed 3 refractory treatments for mixed incontinence 1 year ago including bulking agents.  I did not give her the new beta 3 agonist due to side effects from Myrbetriq   Still has refractory urgency incontinence.  We talked about InterStim and percutaneous tibial nerve stimulation in detail with her asking  She has been having some upper tract GI bleeding.  She fell and had a concussion.  She has had some epidural and other back issues.   Start percutaneous tibial nerve stimulation.  Can always consider InterStim in the future.  She is on blood thinners.  PTNS a good option based on age and comorbidities   Today When I first saw the patient in 2022 she had mixed stress and urge incontinence.  Both are significant.  She was soaking 4 pads a day with high-volume enuresis.  She was voiding every 30 to 60 minutes again at 3-4 times a night.  She had failed Ditropan  and Myrbetriq.  She had mixed incontinence on urodynamics.  I thought she likely may have persistent bedwetting and OAB symptoms if he had a bulking agent.  We have talked about refractory treatments twice.  We have talked about bulking agent in the past.  She has never had the new beta 3 agonist due to side effects from [facility]   Patient has high-volume soaking bedwetting.  High-volume urge incontinence.  Soaks 10 pads a day.  Still has moderately severe stress incontinence.  I went over percutaneous tibial nerve stimulation and InterStim with full templates.  Lengthy conversation became somewhat circular.  I think she understands both treatments well and I think her daughter had InterStim.    Because of comorbidities age and convenience she has decided to do percutaneous tibial  nerve stimulation. If this fails I think she is going to do InterStim. She understands I do the test in [place] but I can do the implant here locally. We will proceed accordingly. Call if culture positive   Today Urge incontinence persisting.  High-volume bedwetting persisting.  Clinically not infected but urine sent for culture.  Frequency stable.  Failed percutaneous tibial nerve stimulation.  She says she takes daily aspirin but no other blood thinners    PMH: Past Medical History:  Diagnosis Date   Asthma    Fibrocystic breast disease    Gastritis    EGD - schatzki's ring, gastritis and gastric polyps   History of colon polyps    Hypercholesterolemia    Hypertension    MAI (mycobacterium avium-intracellulare) infection (HCC)    Osteoporosis    Reflux esophagitis     Surgical History: Past Surgical History:  Procedure Laterality Date   ABDOMINAL HYSTERECTOMY  1998   bladder tact     BREAST EXCISIONAL BIOPSY Bilateral 1970's   benign   BREAST EXCISIONAL BIOPSY Bilateral 2010   pt  stated dr Felipe Horton did bilat axilla lumps neg    BREAST SURGERY     cyst(benign)   CATARACT EXTRACTION W/PHACO Left 06/13/2021   Procedure: CATARACT EXTRACTION PHACO AND INTRAOCULAR LENS PLACEMENT (IOC) LEFT;  Surgeon: Rosa College, MD;  Location[facility];  Service: Ophthalmology;  Laterality: Left;  7.04 0:40.5   CATARACT EXTRACTION W/PHACO Right 06/27/2021   Procedure: CATARACT EXTRACTION PHACO  AND INTRAOCULAR LENS PLACEMENT (IOC) RIGHT;  Surgeon: Rosa College, MD;  Location: [facility];  Service: Ophthalmology;  Laterality: Right;  3.32 0:30.7   COLONOSCOPY N/A 09/28/2014   Procedure: COLONOSCOPY;  Surgeon: Cassie Click, MD;  Location: [facility] ENDOSCOPY;  Service: Endoscopy;  Laterality: N/A;   COLONOSCOPY WITH PROPOFOL  N/A 01/02/2022   Procedure: COLONOSCOPY WITH PROPOFOL ;  Surgeon: Shane Darling, MD;  Location: [facility] ENDOSCOPY;  Service: Endoscopy;   Laterality: N/A;   ESOPHAGOGASTRODUODENOSCOPY N/A 09/28/2014   Procedure: ESOPHAGOGASTRODUODENOSCOPY (EGD);  Surgeon: Cassie Click, MD;  Location: [facility] ENDOSCOPY;  Service: Endoscopy;  Laterality: N/A;   ESOPHAGOGASTRODUODENOSCOPY (EGD) WITH PROPOFOL  N/A 04/26/2016   Procedure: ESOPHAGOGASTRODUODENOSCOPY (EGD) WITH PROPOFOL ;  Surgeon: Cassie Click, MD;  Location: [facility] ENDOSCOPY;  Service: Endoscopy;  Laterality: N/A;   ESOPHAGOGASTRODUODENOSCOPY (EGD) WITH PROPOFOL  N/A 01/02/2022   Procedure: ESOPHAGOGASTRODUODENOSCOPY (EGD) WITH PROPOFOL ;  Surgeon: Shane Darling, MD;  Location: [facility] ENDOSCOPY;  Service: Endoscopy;  Laterality: N/A;   excision of right axillary lipoma  02/2006   EYE SURGERY     FOOT SURGERY Right    HEMORRHOID SURGERY     HERNIA REPAIR  02/2006   NASAL SEPTUM SURGERY      Home Medications:  Allergies as of 09/24/2023       Reactions   Myrbetriq [mirabegron] Other (See Comments)        Medication List        Accurate as of Sep 24, 2023 10:26 AM. If you have any questions, ask your nurse or doctor.          albuterol  108 (90 Base) MCG/ACT inhaler Commonly known as: VENTOLIN  HFA Inhale 2 puffs into the lungs every 6 (six) hours as needed for wheezing or shortness of breath.   aspirin EC 81 MG tablet Take 81 mg by mouth daily. Swallow whole.   calcium  carbonate 1500 (600 Ca) MG Tabs tablet Commonly known as: OSCAL Take by mouth 2 (two) times daily with a meal.   cholecalciferol 25 MCG (1000 UNIT) tablet Commonly known as: VITAMIN D3 Take 1,000 Units by mouth daily.   CVS Slow Release Iron  143 (45 Fe) MG Tbcr Generic drug: Ferrous Sulfate 1 TABLET DAILY   Fish Oil 1000 MG Caps Take 3,000 mg by mouth daily.   iron  dextran complex in sodium chloride  0.9 % 500 mL Inject into the vein once.   metoprolol  succinate 25 MG 24 hr tablet Commonly known as: TOPROL -XL Take 1 tablet (25 mg total) by mouth 2 (two) times daily.   montelukast  10  MG tablet Commonly known as: SINGULAIR  Take 1 tablet (10 mg total) by mouth at bedtime.   multivitamin with minerals Tabs tablet Take 1 tablet by mouth daily.   ondansetron  4 MG tablet Commonly known as: Zofran  Take 1 tablet (4 mg total) by mouth 2 (two) times daily as needed for nausea or vomiting.   pantoprazole  40 MG tablet Commonly known as: PROTONIX  Take 1 tablet (40 mg total) by mouth 2 (two) times daily before a meal.   rosuvastatin  10 MG tablet Commonly known as: CRESTOR  TAKE 1 TABLET DAILY   Trelegy Ellipta  100-62.5-25 MCG/ACT Aepb Generic drug: Fluticasone -Umeclidin-Vilant Inhale 1 puff into the lungs daily.        Allergies:  Allergies  Allergen Reactions   Myrbetriq [Mirabegron] Other (See Comments)    Family History: Family History  Problem Relation Age of Onset   Heart disease Mother 53       heart  attack   Cancer Father        lung   Diabetes Sister    Breast cancer Sister 15   Breast cancer Daughter 40   Stroke Brother    Diabetes Brother     Social History:  reports that she has never smoked. She has never been exposed to tobacco smoke. She has never used smokeless tobacco. She reports that she does not drink alcohol and does not use drugs.  ROS:                                        Physical Exam: There were no vitals taken for this visit.  Constitutional:  Alert and oriented, No acute distress. HEENT: [place] AT, moist mucus membranes.  Trachea midline, no masses.  Laboratory Data: Lab Results  Component Value Date   WBC 4.0 09/03/2023   HGB 11.8 (L) 09/03/2023   HCT 38.0 09/03/2023   MCV 101.9 (H) 09/03/2023   PLT 258 09/03/2023    Lab Results  Component Value Date   CREATININE 0.86 07/31/2023    No results found for: "PSA"  No results found for: "TESTOSTERONE"  Lab Results  Component Value Date   HGBA1C 6.5 07/31/2023    Urinalysis    Component Value Date/Time   COLORURINE YELLOW 12/11/2019  1138   APPEARANCEUR Clear 03/22/2023 1344   LABSPEC 1.025 12/11/2019 1138   PHURINE 6.0 12/11/2019 1138   GLUCOSEU Negative 03/22/2023 1344   GLUCOSEU NEGATIVE 12/11/2019 1138   HGBUR NEGATIVE 12/11/2019 1138   BILIRUBINUR Negative 03/22/2023 1344   KETONESUR NEGATIVE 12/11/2019 1138   PROTEINUR Negative 03/22/2023 1344   UROBILINOGEN 0.2 12/11/2019 1138   NITRITE Negative 03/22/2023 1344   NITRITE NEGATIVE 12/11/2019 1138   LEUKOCYTESUR 1+ (A) 03/22/2023 1344   LEUKOCYTESUR NEGATIVE 12/11/2019 1138    Pertinent Imaging: Urine reviewed and sent for culture  Assessment & Plan: Patient would like to proceed with InterStim.  I went through InterStim with full template.  We will call her and do the test in [place].  She then can have the implant locally Handout given.  Patient understands that device is safe in patients with osteoporosis and back issues and previous back injections  1. Mixed incontinence (Primary)  - Urinalysis, Complete   No follow-ups on file.  Devorah Fonder, MD  [facility] [address] [place], [place] [address] [phone]

## 2023-09-24 NOTE — Patient Instructions (Signed)
MedTronic InterStim Placement  Past treatments haven't given you satisfactory results, which may be due to the way these treatments focus on the muscle rather than the nerves. They don't target the miscommunication between your bladder and your brain. Because bladder function involves both muscles and nerves, you may need something that addresses the communication problem between the bladder and the brain that can cause symptoms.  Medtronic Bladder Control Therapy is unique because it offers a simple evaluation to see if it's right for you. The evaluation is a minimally invasive procedure We will be looking to see if your troublesome bladder symptoms are reduced by at least 50% In as few as 3 days, you'll know if:you're a candidate for long-term treatment we simply need more information   It's important that you document your symptoms before and during your evaluation to help me understand if you see any improvements. I will provide you with a Symptom Tracker and I would like you to document your symptoms for a minimum of three days. Bring your Symptom Tracker back with you at your next appointment.  Tracking daily will help [place] determine if the therapy delivered by the InterStimT system is right for you. You will have 3 appointments scheduled: 1st appointment for a 20-minute, in-office procedure that allows you to try the therapy for a few days. 2nd appointment to remove the lead (thin wire) from your evaluation. 3rd appointment is the date for long-term therapy if you're a candidate or for an advanced evaluation if we need more information.  Here's a brochure explaining the in-office procedure and long-term therapy, as well as the Symptom Tracker we discussed to document your symptoms. For more information, visit [email or web address].

## 2023-09-27 LAB — CULTURE, URINE COMPREHENSIVE

## 2023-11-01 ENCOUNTER — Other Ambulatory Visit: Payer: Self-pay | Admitting: Internal Medicine

## 2023-11-15 ENCOUNTER — Telehealth: Payer: Self-pay

## 2023-11-15 NOTE — Telephone Encounter (Signed)
 Pt ready for scheduling for PROLIA  on or after : 12/16/23  Option# 1: Buy/Bill (Office supplied medication)  Out-of-pocket cost due at time of clinic visit: $0  Number of injection/visits approved: ---  Primary: MEDICARE Prolia  co-insurance: 0% Admin fee co-insurance: 0%  Secondary: UHC MEDSUP Prolia  co-insurance:  Admin fee co-insurance:   Medical Benefit Details: Date Benefits were checked: 11/15/23 Deductible: $257 Met of $257 Required/ Coinsurance: 0%/ Admin Fee: 0%  Prior Auth: N/A PA# Expiration Date:   # of doses approved: ----------------------------------------------------------------------- Option# 2- Med Obtained from pharmacy:  Pharmacy benefit: Copay $--- (Paid to pharmacy) Admin Fee: --- (Pay at clinic)  Prior Auth: --- PA# Expiration Date:   # of doses approved:   If patient wants fill through the pharmacy benefit please send prescription to: ---, and include estimated need by date in rx notes. Pharmacy will ship medication directly to the office.  Patient NOT eligible for Prolia  Copay Card. Copay Card can make patient's cost as little as $25. Link to apply: [email or web address]://[email or web address]  ** This summary of benefits is an estimation of the patient's out-of-pocket cost. Exact cost may very based on individual plan coverage.

## 2023-11-15 NOTE — Telephone Encounter (Signed)
 SABRA

## 2023-11-15 NOTE — Telephone Encounter (Signed)
 Prolia  VOB initiated via [email or web address]  Next Prolia  inj DUE: 12/16/23

## 2023-11-22 ENCOUNTER — Other Ambulatory Visit: Payer: Self-pay

## 2023-11-22 DIAGNOSIS — N3946 Mixed incontinence: Secondary | ICD-10-CM

## 2023-11-22 DIAGNOSIS — N3941 Urge incontinence: Secondary | ICD-10-CM

## 2023-11-22 NOTE — Progress Notes (Signed)
 Surgical Physician Order Form [facility] [place]  * Scheduling expectation : 12/10/2023  *Length of Case:   *Clearance needed: no  *Anticoagulation Instructions: Hold all anticoagulants  *Aspirin Instructions: Hold Aspirin  *Post-op visit Date/Instructions:  1 month follow up   *Diagnosis: Mixed Incontinence  *Procedure: Interstim Stage 1 and 2 with Impedance Check     Additional orders: N/A  -Admit type: OUTpatient  -Anesthesia: MAC  -VTE Prophylaxis Standing Order SCD's       Other:   -Standing Lab Orders Per Anesthesia    Lab other: None  -Standing Test orders EKG/Chest x-ray per Anesthesia       Test other:   - Medications:  Gentamicin per pharmacy, Vancomycin per Pharmacy  -Other orders:  N/A   [employer] REP- Corean Sar [phone] to be present, confirmed FLUORO- NEED C-ARM / PLEASE USE FLUORO FRIENDLY BED  (SACRUM) WILL NEED 3 HOUR TIME [facility]

## 2023-11-23 ENCOUNTER — Telehealth: Payer: Self-pay

## 2023-11-23 NOTE — Progress Notes (Signed)
   [facility] Surgical Posting Form  Surgery Date: Date: 12/10/2023  Surgeon: Dr. Glendia Elizabeth, MD  Inpt ( No  )   Outpt (Yes)   Obs ( No  )   Diagnosis: N39.46 Mixed Incontinence, N39.41 Urge Incontinence  -CPT: 35438, 35418, [phone], [phone]  Surgery: Interstim Placement Stage One and Stage Two with Impedance Check  Stop Anticoagulations: Yes and will need to hold ASA  Cardiac/Medical/Pulmonary Clearance needed: No  *Orders entered into EPIC  Date: 11/23/23   *Case booked in EPIC  Date: 11/22/2023  *Notified pt of Surgery: Date: 11/22/2023  PRE-OP UA & CX: no  *Placed into Prior Authorization Work Que Date: 11/23/23  Assistant/laser/rep:Yes, Interstim Rep to be present for Case

## 2023-11-23 NOTE — Telephone Encounter (Signed)
 Per Dr. Gaston, MD Patient is to be scheduled for  Interstim Placement Stage One and Stage Two with Impedance Check   Angela Cohen was contacted and possible surgical dates were discussed, Monday July 28th, 2025 was agreed upon for surgery.   Patient was directed to call [phone] between 1-3pm the day before surgery to find out surgical arrival time.  Instructions were given not to eat or drink from midnight on the night before surgery and have a driver for the day of surgery. On the surgery day patient was instructed to enter through the [facility] Mall entrance of [facility] report the Same Day Surgery desk.   Pre-Admit Testing will be in contact via phone to set up an interview with the anesthesia team to review your history and medications prior to surgery.   Reminder of this information was sent via Mail to the patient.

## 2023-11-29 NOTE — H&P (Signed)
 HPI: Reviewed chart Discussed 3 refractory treatments for mixed incontinence 1 year ago including bulking agents.  I did not give her the new beta 3 agonist due to side effects from Myrbetriq   Still has refractory urgency incontinence.  We talked about InterStim and percutaneous tibial nerve stimulation in detail with her asking  She has been having some upper tract GI bleeding.  She fell and had a concussion.  She has had some epidural and other back issues.   Start percutaneous tibial nerve stimulation.  Can always consider InterStim in the future.  She is on blood thinners.  PTNS a good option based on age and comorbidities   Today When I first saw the patient in 2022 she had mixed stress and urge incontinence.  Both are significant.  She was soaking 4 pads a day with high-volume enuresis.  She was voiding every 30 to 60 minutes again at 3-4 times a night.  She had failed Ditropan  and Myrbetriq.  She had mixed incontinence on urodynamics.  I thought she likely may have persistent bedwetting and OAB symptoms if he had a bulking agent.  We have talked about refractory treatments twice.  We have talked about bulking agent in the past.  She has never had the new beta 3 agonist due to side effects from [facility]   Patient has high-volume soaking bedwetting.  High-volume urge incontinence.  Soaks 10 pads a day.  Still has moderately severe stress incontinence.  I went over percutaneous tibial nerve stimulation and InterStim with full templates.  Lengthy conversation became somewhat circular.  I think she understands both treatments well and I think her daughter had InterStim.     Because of comorbidities age and convenience she has decided to do percutaneous tibial nerve stimulation. If this fails I think she is going to do InterStim. She understands I do the test in [place] but I can do the implant here locally. We will proceed accordingly. Call if culture positive    Today Urge incontinence  persisting.  High-volume bedwetting persisting.  Clinically not infected but urine sent for culture.  Frequency stable.  Failed percutaneous tibial nerve stimulation.  She says she takes daily aspirin but no other blood thinners       PMH:     Past Medical History:  Diagnosis Date   Asthma     Fibrocystic breast disease     Gastritis      EGD - schatzki's ring, gastritis and gastric polyps   History of colon polyps     Hypercholesterolemia     Hypertension     MAI (mycobacterium avium-intracellulare) infection (HCC)     Osteoporosis     Reflux esophagitis            Surgical History:      Past Surgical History:  Procedure Laterality Date   ABDOMINAL HYSTERECTOMY   1998   bladder tact       BREAST EXCISIONAL BIOPSY Bilateral 1970's    benign   BREAST EXCISIONAL BIOPSY Bilateral 2010    pt  stated dr claudene did bilat axilla lumps neg    BREAST SURGERY        cyst(benign)   CATARACT EXTRACTION W/PHACO Left 06/13/2021    Procedure: CATARACT EXTRACTION PHACO AND INTRAOCULAR LENS PLACEMENT (IOC) LEFT;  Surgeon: Myrna Adine Anes, MD;  Location[facility];  Service: Ophthalmology;  Laterality: Left;  7.04 0:40.5   CATARACT EXTRACTION W/PHACO Right 06/27/2021    Procedure: CATARACT EXTRACTION PHACO  AND INTRAOCULAR LENS PLACEMENT (IOC) RIGHT;  Surgeon: Myrna Adine Anes, MD;  Location: [facility];  Service: Ophthalmology;  Laterality: Right;  3.32 0:30.7   COLONOSCOPY N/A 09/28/2014    Procedure: COLONOSCOPY;  Surgeon: Lamar ONEIDA Holmes, MD;  Location: [facility] ENDOSCOPY;  Service: Endoscopy;  Laterality: N/A;   COLONOSCOPY WITH PROPOFOL  N/A 01/02/2022    Procedure: COLONOSCOPY WITH PROPOFOL ;  Surgeon: Maryruth Ole ONEIDA, MD;  Location: [facility] ENDOSCOPY;  Service: Endoscopy;  Laterality: N/A;   ESOPHAGOGASTRODUODENOSCOPY N/A 09/28/2014    Procedure: ESOPHAGOGASTRODUODENOSCOPY (EGD);  Surgeon: Lamar ONEIDA Holmes, MD;  Location[facility] ENDOSCOPY;  Service: Endoscopy;   Laterality: N/A;   ESOPHAGOGASTRODUODENOSCOPY (EGD) WITH PROPOFOL  N/A 04/26/2016    Procedure: ESOPHAGOGASTRODUODENOSCOPY (EGD) WITH PROPOFOL ;  Surgeon: Lamar ONEIDA Holmes, MD;  Location: [facility] ENDOSCOPY;  Service: Endoscopy;  Laterality: N/A;   ESOPHAGOGASTRODUODENOSCOPY (EGD) WITH PROPOFOL  N/A 01/02/2022    Procedure: ESOPHAGOGASTRODUODENOSCOPY (EGD) WITH PROPOFOL ;  Surgeon: Maryruth Ole ONEIDA, MD;  Location: [facility] ENDOSCOPY;  Service: Endoscopy;  Laterality: N/A;   excision of right axillary lipoma   02/2006   EYE SURGERY       FOOT SURGERY Right     HEMORRHOID SURGERY       HERNIA REPAIR   02/2006   NASAL SEPTUM SURGERY              Home Medications:  Allergies as of 09/24/2023         Reactions    Myrbetriq [mirabegron] Other (See Comments)            Medication List           Accurate as of Sep 24, 2023 10:26 AM. If you have any questions, ask your nurse or doctor.              albuterol  108 (90 Base) MCG/ACT inhaler Commonly known as: VENTOLIN  HFA Inhale 2 puffs into the lungs every 6 (six) hours as needed for wheezing or shortness of breath.    aspirin EC 81 MG tablet Take 81 mg by mouth daily. Swallow whole.    calcium  carbonate 1500 (600 Ca) MG Tabs tablet Commonly known as: OSCAL Take by mouth 2 (two) times daily with a meal.    cholecalciferol 25 MCG (1000 UNIT) tablet Commonly known as: VITAMIN D3 Take 1,000 Units by mouth daily.    [employer] Slow Release Iron  143 (45 Fe) MG Tbcr Generic drug: Ferrous Sulfate 1 TABLET DAILY    Fish Oil 1000 MG Caps Take 3,000 mg by mouth daily.    iron  dextran complex in sodium chloride  0.9 % 500 mL Inject into the vein once.    metoprolol  succinate 25 MG 24 hr tablet Commonly known as: TOPROL -XL Take 1 tablet (25 mg total) by mouth 2 (two) times daily.    montelukast  10 MG tablet Commonly known as: SINGULAIR  Take 1 tablet (10 mg total) by mouth at bedtime.    multivitamin with minerals Tabs tablet Take 1 tablet  by mouth daily.    ondansetron  4 MG tablet Commonly known as: Zofran  Take 1 tablet (4 mg total) by mouth 2 (two) times daily as needed for nausea or vomiting.    pantoprazole  40 MG tablet Commonly known as: PROTONIX  Take 1 tablet (40 mg total) by mouth 2 (two) times daily before a meal.    rosuvastatin  10 MG tablet Commonly known as: CRESTOR  TAKE 1 TABLET DAILY    Trelegy Ellipta  100-62.5-25 MCG/ACT Aepb Generic drug: Fluticasone -Umeclidin-Vilant Inhale 1 puff into the lungs  daily.             Allergies:  Allergies      Allergies  Allergen Reactions   Myrbetriq [Mirabegron] Other (See Comments)        Family History:      Family History  Problem Relation Age of Onset   Heart disease Mother 54        heart attack   Cancer Father          lung   Diabetes Sister     Breast cancer Sister 69   Breast cancer Daughter 24   Stroke Brother     Diabetes Brother            Social History:  reports that she has never smoked. She has never been exposed to tobacco smoke. She has never used smokeless tobacco. She reports that she does not drink alcohol and does not use drugs.   ROS:                           Physical Exam: There were no vitals taken for this visit.  Constitutional:  Alert and oriented, No acute distress. HEENT: [place] AT, moist mucus membranes.  Trachea midline, no masses.   Laboratory Data: Recent Labs       Lab Results  Component Value Date    WBC 4.0 09/03/2023    HGB 11.8 (L) 09/03/2023    HCT 38.0 09/03/2023    MCV 101.9 (H) 09/03/2023    PLT 258 09/03/2023        Recent Labs       Lab Results  Component Value Date    CREATININE 0.86 07/31/2023        Recent Labs  No results found for: PSA     Recent Labs  No results found for: TESTOSTERONE     Recent Labs       Lab Results  Component Value Date    HGBA1C 6.5 07/31/2023        Urinalysis Labs (Brief)          Component Value Date/Time    COLORURINE  YELLOW 12/11/2019 1138    APPEARANCEUR Clear 03/22/2023 1344    LABSPEC 1.025 12/11/2019 1138    PHURINE 6.0 12/11/2019 1138    GLUCOSEU Negative 03/22/2023 1344    GLUCOSEU NEGATIVE 12/11/2019 1138    HGBUR NEGATIVE 12/11/2019 1138    BILIRUBINUR Negative 03/22/2023 1344    KETONESUR NEGATIVE 12/11/2019 1138    PROTEINUR Negative 03/22/2023 1344    UROBILINOGEN 0.2 12/11/2019 1138    NITRITE Negative 03/22/2023 1344    NITRITE NEGATIVE 12/11/2019 1138    LEUKOCYTESUR 1+ (A) 03/22/2023 1344    LEUKOCYTESUR NEGATIVE 12/11/2019 1138        Pertinent Imaging: Urine reviewed and sent for culture   Assessment & Plan: Patient would like to proceed with InterStim.  I went through InterStim with full template.  We will call her and do the test in [place].  She then can have the implant locally Handout given.  Patient understands that device is safe in patients with osteoporosis and back issues and previous back injections

## 2023-12-03 ENCOUNTER — Ambulatory Visit: Admitting: Internal Medicine

## 2023-12-03 ENCOUNTER — Encounter: Payer: Self-pay | Admitting: Internal Medicine

## 2023-12-03 VITALS — BP 126/74 | HR 90 | Resp 16 | Ht 64.0 in | Wt 205.0 lb

## 2023-12-03 DIAGNOSIS — D509 Iron deficiency anemia, unspecified: Secondary | ICD-10-CM

## 2023-12-03 DIAGNOSIS — E1165 Type 2 diabetes mellitus with hyperglycemia: Secondary | ICD-10-CM

## 2023-12-03 DIAGNOSIS — N3946 Mixed incontinence: Secondary | ICD-10-CM

## 2023-12-03 DIAGNOSIS — Z860100 Personal history of colon polyps, unspecified: Secondary | ICD-10-CM

## 2023-12-03 DIAGNOSIS — J45909 Unspecified asthma, uncomplicated: Secondary | ICD-10-CM

## 2023-12-03 DIAGNOSIS — E7800 Pure hypercholesterolemia, unspecified: Secondary | ICD-10-CM

## 2023-12-03 DIAGNOSIS — R1310 Dysphagia, unspecified: Secondary | ICD-10-CM

## 2023-12-03 DIAGNOSIS — D329 Benign neoplasm of meninges, unspecified: Secondary | ICD-10-CM

## 2023-12-03 DIAGNOSIS — I10 Essential (primary) hypertension: Secondary | ICD-10-CM

## 2023-12-03 DIAGNOSIS — R739 Hyperglycemia, unspecified: Secondary | ICD-10-CM

## 2023-12-03 DIAGNOSIS — E785 Hyperlipidemia, unspecified: Secondary | ICD-10-CM | POA: Diagnosis not present

## 2023-12-03 LAB — BASIC METABOLIC PANEL WITH GFR
BUN: 16 mg/dL (ref 6–23)
CO2: 27 meq/L (ref 19–32)
Calcium: 9.3 mg/dL (ref 8.4–10.5)
Chloride: 102 meq/L (ref 96–112)
Creatinine, Ser: 0.9 mg/dL (ref 0.40–1.20)
GFR: 59.78 mL/min — ABNORMAL LOW (ref >60.00)
Glucose, Bld: 135 mg/dL — ABNORMAL HIGH (ref 70–99)
Potassium: 4.7 meq/L (ref 3.5–5.1)
Sodium: 139 meq/L (ref 135–145)

## 2023-12-03 LAB — CBC WITH DIFFERENTIAL/PLATELET
Basophils Absolute: 0.1 K/uL (ref 0.0–0.1)
Basophils Relative: 1.8 % (ref 0.0–3.0)
Eosinophils Absolute: 0.3 K/uL (ref 0.0–0.7)
Eosinophils Relative: 7.3 % — ABNORMAL HIGH (ref 0.0–5.0)
HCT: 33.5 % — ABNORMAL LOW (ref 36.0–46.0)
Hemoglobin: 10.9 g/dL — ABNORMAL LOW (ref 12.0–15.0)
Lymphocytes Relative: 22.3 % (ref 12.0–46.0)
Lymphs Abs: 1 K/uL (ref 0.7–4.0)
MCHC: 32.4 g/dL (ref 30.0–36.0)
MCV: 93.8 fl (ref 78.0–100.0)
Monocytes Absolute: 0.6 K/uL (ref 0.1–1.0)
Monocytes Relative: 12.2 % — ABNORMAL HIGH (ref 3.0–12.0)
Neutro Abs: 2.6 K/uL (ref 1.4–7.7)
Neutrophils Relative %: 56.4 % (ref 43.0–77.0)
Platelets: 238 K/uL (ref 150.0–400.0)
RBC: 3.57 Mil/uL — ABNORMAL LOW (ref 3.87–5.11)
RDW: 13.7 % (ref 11.5–15.5)
WBC: 4.6 K/uL (ref 4.0–10.5)

## 2023-12-03 LAB — VITAMIN B12: Vitamin B-12: >1500 pg/mL — ABNORMAL HIGH (ref 211–911)

## 2023-12-03 LAB — LIPID PANEL
Cholesterol: 179 mg/dL (ref 0–200)
HDL: 84.7 mg/dL (ref >39.00)
LDL Cholesterol: 63 mg/dL (ref 0–99)
NonHDL: 94.64
Total CHOL/HDL Ratio: 2
Triglycerides: 157 mg/dL — ABNORMAL HIGH (ref 0.0–149.0)
VLDL: 31.4 mg/dL (ref 0.0–40.0)

## 2023-12-03 LAB — HEPATIC FUNCTION PANEL
ALT: 26 U/L (ref 0–35)
AST: 26 U/L (ref 0–37)
Albumin: 4.5 g/dL (ref 3.5–5.2)
Alkaline Phosphatase: 85 U/L (ref 39–117)
Bilirubin, Direct: 0.1 mg/dL (ref 0.0–0.3)
Total Bilirubin: 0.3 mg/dL (ref 0.2–1.2)
Total Protein: 7.1 g/dL (ref 6.0–8.3)

## 2023-12-03 LAB — IBC + FERRITIN
Ferritin: 16.6 ng/mL (ref 10.0–291.0)
Iron: 25 ug/dL — ABNORMAL LOW (ref 42–145)
Saturation Ratios: 4.6 % — ABNORMAL LOW (ref 20.0–50.0)
TIBC: 547.4 ug/dL — ABNORMAL HIGH (ref 250.0–450.0)
Transferrin: 391 mg/dL — ABNORMAL HIGH (ref 212.0–360.0)

## 2023-12-03 LAB — HEMOGLOBIN A1C: Hgb A1c MFr Bld: 6.8 % — ABNORMAL HIGH (ref 4.6–6.5)

## 2023-12-03 LAB — HM DIABETES FOOT EXAM

## 2023-12-03 NOTE — Progress Notes (Signed)
 Subjective:    [name], female    DOB: [birth year], 83 y.o.   MRN: [id number]  Patient here for  Chief Complaint  Patient presents with   Medical Management of Chronic Issues    HPI Here for a scheduled follow up - f/u regrding diabetes and hypercholesterolemia. Sees Dr Tamea for moderate persistent asthma. CT chest showed no ILD, very large hiatal hernia with intrathoracic stomach. Continue Trelegy. She has severe issues with gastroesophageal reflux she has been noted to have esophageal dysmotility and a hiatal hernia that is at least 5 cm in size. Taking PPI, and previously had increased issues with some increased mucus and regurgitation and intermittent nausea. This is better now. Swallowing better. No increased acid reflux. Seeing Dr Babara - f/u iron  deficiency anemia. Last seen 09/10/23 - stable. Recommended to continue oral iron  supplementation.  Request to have checked today. Having increased RLS. Seeing Dr Maree - f/u regarding occipital neuralgia vs post concussive headache syndrome. Also concern for increased ICP. Headache returned 03/2023 - saw Dr Herminio. MRI scheduled for 08/20/23. Last saw Dr Maree - 09/11/23 - partial relief with baclofen . Prescribed lyrica. New small lacunar stroke - right posterior cerebellum, felt likely due to microvascular ischemic changes. (Moderate to severe microvascular ischemic changes in MRI. Meningioma - well managed since 2016 - no mass effect. Recommended f/u in 5 months. Overall stable. Seeing urology for mixed incontinence. Scheduled for insertion of sacral nerve stimulator (interstim). Temporary helped her bladder issues and her back pain and her she was able to move her bowels more regularly.    Past Medical History:  Diagnosis Date   Aortic atherosclerosis (HCC)    Asthma    Bilateral occipital neuralgia    Bilateral occipital neuralgia    Cancer (HCC) 07/10/2023   melanoma   Diabetes mellitus without complication (HCC)     Dyspnea    Fibrocystic breast disease    Gastritis    EGD - schatzki's ring, gastritis and gastric polyps   GERD (gastroesophageal reflux disease)    Hiatal hernia    History of colon polyps    Hypercholesterolemia    Hypertension    IDA (iron  deficiency anemia)    MAI (mycobacterium avium-intracellulare) infection (HCC)    Meningioma (HCC)    Mixed incontinence    Osteoporosis    Pneumonia    PONV (postoperative nausea and vomiting)    Reflux esophagitis    Small vessel stroke [facility])    Past Surgical History:  Procedure Laterality Date   ABDOMINAL HYSTERECTOMY  1998   bladder tact     BREAST EXCISIONAL BIOPSY Bilateral 1970's   benign   BREAST EXCISIONAL BIOPSY Bilateral 2010   pt  stated dr claudene did bilat axilla lumps neg    BREAST SURGERY     cyst(benign)   CATARACT EXTRACTION W/PHACO Left 06/13/2021   Procedure: CATARACT EXTRACTION PHACO AND INTRAOCULAR LENS PLACEMENT (IOC) LEFT;  Surgeon: Myrna Adine Anes, MD;  Location: [facility] SURGERY [facility];  Service: Ophthalmology;  Laterality: Left;  7.04 0:40.5   CATARACT EXTRACTION W/PHACO Right 06/27/2021   Procedure: CATARACT EXTRACTION PHACO AND INTRAOCULAR LENS PLACEMENT (IOC) RIGHT;  Surgeon: Myrna Adine Anes, MD;  Location: [facility];  Service: Ophthalmology;  Laterality: Right;  3.32 0:30.7   COLONOSCOPY N/A 09/28/2014   Procedure: COLONOSCOPY;  Surgeon: Lamar ONEIDA Holmes, MD;  Location: [facility] ENDOSCOPY;  Service: Endoscopy;  Laterality: N/A;   COLONOSCOPY WITH PROPOFOL  N/A 01/02/2022   Procedure: COLONOSCOPY WITH  PROPOFOL ;  Surgeon: Maryruth Ole DASEN, MD;  Location: [facility] ENDOSCOPY;  Service: Endoscopy;  Laterality: N/A;   ESOPHAGOGASTRODUODENOSCOPY N/A 09/28/2014   Procedure: ESOPHAGOGASTRODUODENOSCOPY (EGD);  Surgeon: Lamar DASEN Holmes, MD;  Location: [facility] ENDOSCOPY;  Service: Endoscopy;  Laterality: N/A;   ESOPHAGOGASTRODUODENOSCOPY (EGD) WITH PROPOFOL  N/A 04/26/2016   Procedure: ESOPHAGOGASTRODUODENOSCOPY  (EGD) WITH PROPOFOL ;  Surgeon: Lamar DASEN Holmes, MD;  Location: [facility] ENDOSCOPY;  Service: Endoscopy;  Laterality: N/A;   ESOPHAGOGASTRODUODENOSCOPY (EGD) WITH PROPOFOL  N/A 01/02/2022   Procedure: ESOPHAGOGASTRODUODENOSCOPY (EGD) WITH PROPOFOL ;  Surgeon: Maryruth Ole DASEN, MD;  Location: [facility] ENDOSCOPY;  Service: Endoscopy;  Laterality: N/A;   excision of right axillary lipoma  02/2006   EYE SURGERY     FOOT SURGERY Right    FRACTURE SURGERY  2019   left shoulder repair   HEMORRHOID SURGERY     HERNIA REPAIR  02/2006   NASAL SEPTUM SURGERY     Family History  Problem Relation Age of Onset   Heart disease Mother 60       heart attack   Cancer Father        lung   Diabetes Sister    Breast cancer Sister 34   Breast cancer Daughter 9   Stroke Brother    Diabetes Brother    Social History   Socioeconomic History   Marital status: Married    Spouse name: VANACORE,AL   Number of children: Not on file   Years of education: Not on file   Highest education level: Not on file  Occupational History   Not on file  Tobacco Use   Smoking status: Never    Passive exposure: Never   Smokeless tobacco: Never   Tobacco comments:    Never  Vaping Use   Vaping status: Never Used  Substance and Sexual Activity   Alcohol use: No    Alcohol/week: 0.0 standard drinks of alcohol   Drug use: No   Sexual activity: Not on file  Other Topics Concern   Not on file  Social History Narrative   Lives husband   Social Drivers of Corporate investment banker Strain: Not on file  Food Insecurity: Not on file  Transportation Needs: Not on file  Physical Activity: Not on file  Stress: Not on file  Social Connections: Not on file     Review of Systems  Constitutional:  Negative for appetite change and unexpected weight change.  HENT:  Negative for congestion and sinus pressure.   Respiratory:  Negative for cough, chest tightness and shortness of breath.   Cardiovascular:  Negative for  chest pain, palpitations and leg swelling.  Gastrointestinal:  Negative for abdominal pain, diarrhea, nausea and vomiting.  Genitourinary:  Negative for difficulty urinating and dysuria.  Musculoskeletal:  Negative for joint swelling and myalgias.  Skin:  Negative for color change and rash.  Neurological:  Negative for dizziness and headaches.  Psychiatric/Behavioral:  Negative for agitation and dysphoric mood.        Objective:     BP 126/74   Pulse 90   Resp 16   Ht 5' 4 (1.626 m)   Wt 205 lb (93 kg)   SpO2 99%   BMI 35.19 kg/m  Wt Readings from Last 3 Encounters:  12/03/23 205 lb (93 kg)  09/24/23 208 lb 2 oz (94.4 kg)  09/10/23 209 lb 6.4 oz (95 kg)    Physical Exam Vitals reviewed.  Constitutional:      General: She is not in acute  distress.    Appearance: Normal appearance.  HENT:     Head: Normocephalic and atraumatic.     Right Ear: External ear normal.     Left Ear: External ear normal.     Mouth/Throat:     Pharynx: No oropharyngeal exudate or posterior oropharyngeal erythema.  Eyes:     General: No scleral icterus.       Right eye: No discharge.        Left eye: No discharge.     Conjunctiva/sclera: Conjunctivae normal.  Neck:     Thyroid : No thyromegaly.  Cardiovascular:     Rate and Rhythm: Normal rate and regular rhythm.  Pulmonary:     Effort: No respiratory distress.     Breath sounds: Normal breath sounds. No wheezing.  Abdominal:     General: Bowel sounds are normal.     Palpations: Abdomen is soft.     Tenderness: There is no abdominal tenderness.  Musculoskeletal:        General: No swelling or tenderness.     Cervical back: Neck supple. No tenderness.  Lymphadenopathy:     Cervical: No cervical adenopathy.  Skin:    Findings: No erythema or rash.  Neurological:     Mental Status: She is alert.  Psychiatric:        Mood and Affect: Mood normal.        Behavior: Behavior normal.         Outpatient Encounter Medications as of  12/03/2023  Medication Sig   acetaminophen  (TYLENOL ) 500 MG tablet Take 1,000 mg by mouth daily.   aspirin 81 MG EC tablet Take 81 mg by mouth at bedtime. Swallow whole.   baclofen  (LIORESAL ) 10 MG tablet Take 10 mg by mouth 2 (two) times daily.   Calcium  Carb-Cholecalciferol (CALCIUM +D3 PO) Take 2 tablets by mouth at bedtime.   Collagen-Vitamin C-Biotin (COLLAGEN PO) Take 2 tablets by mouth at bedtime.   diphenhydrAMINE (BENADRYL) 25 MG tablet Take 50 mg by mouth at bedtime.   ELDERBERRY PO Take 50 mg by mouth at bedtime.   ferrous sulfate 325 (65 FE) MG tablet Take 325 mg by mouth every Sunday.   Fluticasone -Umeclidin-Vilant (TRELEGY ELLIPTA ) 100-62.5-25 MCG/ACT AEPB Inhale 1 puff into the lungs daily. (Patient taking differently: Inhale 1 puff into the lungs daily as needed (shortness of breath).)   ibuprofen (ADVIL) 200 MG tablet Take 400 mg by mouth every evening.   Magnesium  400 MG TABS Take 400 mg by mouth at bedtime.   Melatonin 10 MG TABS Take 20 mg by mouth at bedtime.   metoprolol  succinate (TOPROL -XL) 25 MG 24 hr tablet Take 1 tablet (25 mg total) by mouth 2 (two) times daily.   montelukast  (SINGULAIR ) 10 MG tablet Take 1 tablet (10 mg total) by mouth at bedtime.   Multiple Vitamin (MULTIVITAMIN WITH MINERALS) TABS Take 2 tablets by mouth at bedtime.   Omega-3 Fatty Acids (FISH OIL) 1200 MG CAPS Take 2,400 mg by mouth daily.   ondansetron  (ZOFRAN ) 4 MG tablet Take 1 tablet (4 mg total) by mouth 2 (two) times daily as needed for nausea or vomiting.   pantoprazole  (PROTONIX ) 40 MG tablet Take 1 tablet (40 mg total) by mouth 2 (two) times daily before a meal.   Potassium 99 MG TABS Take 99 mg by mouth at bedtime.   pregabalin (LYRICA) 50 MG capsule Take 50 mg by mouth 2 (two) times daily.   rosuvastatin  (CRESTOR ) 10 MG tablet TAKE 1 TABLET DAILY (Patient  taking differently: Take 10 mg by mouth at bedtime.)   vitamin B-12 (CYANOCOBALAMIN ) 500 MCG tablet Take 1,000 mcg by mouth at  bedtime.   Facility-Administered Encounter Medications as of 12/03/2023  Medication   [START ON 12/17/2023] denosumab  (PROLIA ) injection 60 mg     Lab Results  Component Value Date   WBC 4.6 12/03/2023   HGB 10.9 (L) 12/03/2023   HCT 33.5 (L) 12/03/2023   PLT 238.0 12/03/2023   GLUCOSE 135 (H) 12/03/2023   CHOL 179 12/03/2023   TRIG 157.0 (H) 12/03/2023   HDL 84.70 12/03/2023   LDLCALC 63 12/03/2023   ALT 26 12/03/2023   AST 26 12/03/2023   NA 139 12/03/2023   K 4.7 12/03/2023   CL 102 12/03/2023   CREATININE 0.90 12/03/2023   BUN 16 12/03/2023   CO2 27 12/03/2023   TSH 3.45 07/31/2023   HGBA1C 6.8 (H) 12/03/2023   MICROALBUR <0.7 07/31/2023    MR BRAIN W WO CONTRAST Result Date: 09/03/2023 CLINICAL DATA:  Dizziness. Recent removal of melanoma from the face. Fall previously in 2023. patient also has a known meningioma. EXAM: MRI HEAD WITHOUT AND WITH CONTRAST TECHNIQUE: Multiplanar, multiecho pulse sequences of the brain and surrounding structures were obtained without and with intravenous contrast. CONTRAST:  8 cc Vueway  COMPARISON:  11/30/2021 FINDINGS: Brain: Diffusion imaging does not show any acute or subacute infarction or other cause of restricted diffusion. No focal abnormality affects the brainstem. There is an old small vessel infarction of the right posterior cerebellum. Cerebral hemispheres show moderate to severe chronic small-vessel ischemic change of the deep and subcortical white matter. No cortical or large vessel territory stroke. No intra-axial mass lesion, hemorrhage, hydrocephalus or extra-axial collection. Planum sphenoidale meningioma measures only 15 x 9 x 4 mm and does not have any significant mass-effect upon the brain. No measurable enlargement since the study of 2023. No evidence of metastatic disease. Vascular: Major vessels at the base of the brain show flow. Skull and upper cervical spine: Negative Sinuses/Orbits: Clear/normal Other: None IMPRESSION: 1. No  acute or reversible finding. 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 Moderate to severe chronic small-vessel ischemic change of the cerebral hemispheric white matter. 2. Planum sphenoidale meningioma measures only 15 x 9 x 4 mm and does not have any significant mass-effect upon the brain. No measurable enlargement since the study of 2023. 3. No evidence of metastatic disease. Electronically Signed   By: Oneil Officer M.D.   On: 09/03/2023 17:47       Assessment & Plan:  Iron  deficiency anemia, unspecified iron  deficiency anemia type Assessment & Plan: Seeing Dr Babara - f/u iron  deficiency anemia. Last seen 09/10/23 - stable. Recommended to continue oral iron  supplementation.  Request to have checked today. Having increased RLS.  Orders: -     CBC with Differential/Platelet -     Vitamin B12 -     IBC + Ferritin  Dyslipidemia  Essential hypertension, benign Assessment & Plan: Blood pressure as outlined.  On metoprolol .  Continue current medication. Follow pressures. Follow metabolic panel.    Hypercholesterolemia Assessment & Plan: Continue Crestor .  Low-cholesterol diet and exercise. Check lipid panel.   Orders: -     Hepatic function panel -     Lipid panel  Type 2 diabetes mellitus with hyperglycemia, without long-term current use of insulin (HCC) Assessment & Plan: Low carb diet and exercise.  Follow met b and A1c.   Orders: -     Basic  metabolic panel with GFR -     Hemoglobin A1c  Uncomplicated asthma, unspecified asthma severity, unspecified whether persistent Assessment & Plan: Sees Dr Tamea for moderate persistent asthma. CT chest showed no ILD, very large hiatal hernia with intrathoracic stomach. Continue Trelegy. Breathing stable.    Dysphagia, unspecified type Assessment & Plan:  She has severe issues with gastroesophageal reflux she has been noted to have esophageal dysmotility and a hiatal hernia that is at least 5 cm in size. Taking PPI,  and previously had increased issues with some increased mucus and regurgitation and intermittent nausea. This is better now. Swallowing better. No increased acid reflux.   History of colonic polyps Assessment & Plan: Colonoscopy 01/02/22 - COLON POLYP, TRANSVERSE; COLD BIOPSY:  - TUBULAR ADENOMA.  - NEGATIVE FOR HIGH-GRADE DYSPLASIA AND MALIGNANCY.    Hyperglycemia Assessment & Plan: Low carb diet and exercise. Follow met b and A1c.    Meningioma [facility]) Assessment & Plan: Seeing Dr Maree - f/u regarding occipital neuralgia vs post concussive headache syndrome. Also concern for increased ICP. Headache returned 03/2023 - saw Dr Herminio. MRI scheduled for 08/20/23. Last saw Dr Maree - 09/11/23 - partial relief with baclofen . Prescribed lyrica. New small lacunar stroke - right posterior cerebellum, felt likely due to microvascular ischemic changes. (Moderate to severe microvascular ischemic changes in MRI. Meningioma - well managed since 2016 - no mass effect. Recommended f/u in 5 months. Overall stable.   Mixed incontinence Assessment & Plan: Seeing urology for mixed incontinence. Scheduled for insertion of sacral nerve stimulator (interstim). Temporary helped her bladder issues and her back pain and her she was able to move her bowels more regularly.       Allena Hamilton, MD

## 2023-12-04 ENCOUNTER — Ambulatory Visit: Payer: Self-pay | Admitting: Internal Medicine

## 2023-12-04 ENCOUNTER — Encounter
Admission: RE | Admit: 2023-12-04 | Discharge: 2023-12-04 | Disposition: A | Discharge status: Home / Self Care | Source: Ambulatory Visit | Attending: Urology | Admitting: Urology

## 2023-12-04 ENCOUNTER — Other Ambulatory Visit: Payer: Self-pay

## 2023-12-04 DIAGNOSIS — D508 Other iron deficiency anemias: Secondary | ICD-10-CM | POA: Insufficient documentation

## 2023-12-04 DIAGNOSIS — Z01810 Encounter for preprocedural cardiovascular examination: Secondary | ICD-10-CM | POA: Diagnosis not present

## 2023-12-04 DIAGNOSIS — I493 Ventricular premature depolarization: Secondary | ICD-10-CM | POA: Diagnosis not present

## 2023-12-04 DIAGNOSIS — E1165 Type 2 diabetes mellitus with hyperglycemia: Secondary | ICD-10-CM

## 2023-12-04 DIAGNOSIS — I10 Essential (primary) hypertension: Secondary | ICD-10-CM

## 2023-12-04 HISTORY — DX: Iron deficiency anemia, unspecified: D50.9

## 2023-12-04 HISTORY — DX: Diaphragmatic hernia without obstruction or gangrene: K44.9

## 2023-12-04 HISTORY — DX: Other specified postprocedural states: Z98.890

## 2023-12-04 HISTORY — DX: Atherosclerosis of aorta: I70.0

## 2023-12-04 HISTORY — DX: Occipital neuralgia: M54.81

## 2023-12-04 HISTORY — DX: Pneumonia, unspecified organism: J18.9

## 2023-12-04 HISTORY — DX: Cerebral infarction, unspecified: I63.9

## 2023-12-04 HISTORY — DX: Benign neoplasm of meninges, unspecified: D32.9

## 2023-12-04 HISTORY — DX: Mixed incontinence: N39.46

## 2023-12-04 HISTORY — DX: Type 2 diabetes mellitus without complications: E11.9

## 2023-12-04 HISTORY — DX: Gastro-esophageal reflux disease without esophagitis: K21.9

## 2023-12-04 HISTORY — DX: Dyspnea, unspecified: R06.00

## 2023-12-04 NOTE — Telephone Encounter (Signed)
 Copied from CRM [id number]. Topic: General - Other >> Dec 04, 2023  1:12 PM Jayma L wrote: Reason for CRM: read half of test results, patient asking for a nurse to call her back

## 2023-12-04 NOTE — Patient Instructions (Addendum)
 Your procedure is scheduled on: 12/10/23 - Monday Report to the Registration Desk on the 1st floor of the [facility]. To find out your arrival time, please call [phone] between 1PM - 3PM on: 12/07/23 - Friday If your arrival time is 6:00 am, do not arrive before that time as the [facility] entrance doors do not open until 6:00 am.  REMEMBER: Instructions that are not followed completely may result in serious medical risk, up to and including death; or upon the discretion of your surgeon and anesthesiologist your surgery may need to be rescheduled.  Do not eat food or drink any liquids after midnight the night before surgery.  No gum chewing or hard candies.   One week prior to surgery: Stop Anti-inflammatories (NSAIDS) such as Advil, Aleve, Ibuprofen, Motrin, Naproxen, Naprosyn and Aspirin based products such as Excedrin, Goody's Powder, BC Powder. You may continue to take Tylenol  if needed for pain up until the day of surgery.  Stop ANY OVER THE COUNTER supplements until after surgery.  ON THE DAY OF SURGERY ONLY TAKE THESE MEDICATIONS WITH SIPS OF WATER:  baclofen  (LIORESAL )  metoprolol  succinate  pantoprazole  (PROTONIX )  pregabalin (LYRICA)    No Alcohol for 24 hours before or after surgery.  No Smoking including e-cigarettes for 24 hours before surgery.  No chewable tobacco products for at least 6 hours before surgery.  No nicotine patches on the day of surgery.  Do not use any recreational drugs for at least a week (preferably 2 weeks) before your surgery.  Please be advised that the combination of cocaine and anesthesia may have negative outcomes, up to and including death. If you test positive for cocaine, your surgery will be cancelled.  On the morning of surgery brush your teeth with toothpaste and water, you may rinse your mouth with mouthwash if you wish. Do not swallow any toothpaste or mouthwash.  Use CHG Soap or wipes as directed on instruction  sheet.  Do not wear jewelry, make-up, hairpins, clips or nail polish.  For welded (permanent) jewelry: bracelets, anklets, waist bands, etc.  Please have this removed prior to surgery.  If it is not removed, there is a chance that hospital personnel will need to cut it off on the day of surgery.  Do not wear lotions, powders, or perfumes.   Do not shave body hair from the neck down 48 hours before surgery.  Contact lenses, hearing aids and dentures may not be worn into surgery.  Do not bring valuables to the hospital. [facility] is not responsible for any missing/lost belongings or valuables.   Notify your doctor if there is any change in your medical condition (cold, fever, infection).  Wear comfortable clothing (specific to your surgery type) to the hospital.  After surgery, you can help prevent lung complications by doing breathing exercises.  Take deep breaths and cough every 1-2 hours. Your doctor may order a device called an Incentive Spirometer to help you take deep breaths.  When coughing or sneezing, hold a pillow firmly against your incision with both hands. This is called "splinting." Doing this helps protect your incision. It also decreases belly discomfort.  If you are being admitted to the hospital overnight, leave your suitcase in the car. After surgery it may be brought to your room.  In case of increased patient census, it may be necessary for you, the patient, to continue your postoperative care in the Same Day Surgery department.  If you are being discharged the day  of surgery, you will not be allowed to drive home. You will need a responsible individual to drive you home and stay with you for 24 hours after surgery.   If you are taking public transportation, you will need to have a responsible individual with you.  Please call the Pre-admissions Testing Dept. at [phone] if you have any questions about these instructions.  Surgery Visitation  Policy:  Patients having surgery or a procedure may have two visitors.  Children under the age of 59 must have an adult with them who is not the patient.  Inpatient Visitation:    Visiting hours are 7 a.m. to 8 p.m. Up to four visitors are allowed at one time in a patient room. The visitors may rotate out with other people during the day.  One visitor age 75 or older may stay with the patient overnight and must be in the room by 8 p.m.   Merchandiser, retail to address health-related social needs:  [email or web address]    Preparing for Surgery with CHLORHEXIDINE GLUCONATE (CHG) Soap  Chlorhexidine Gluconate (CHG) Soap  o An antiseptic cleaner that kills germs and bonds with the skin to continue killing germs even after washing  o Used for showering the night before surgery and morning of surgery  Before surgery, you can play an important role by reducing the number of germs on your skin.  CHG (Chlorhexidine gluconate) soap is an antiseptic cleanser which kills germs and bonds with the skin to continue killing germs even after washing.  Please do not use if you have an allergy to CHG or antibacterial soaps. If your skin becomes reddened/irritated stop using the CHG.  1. Shower the NIGHT BEFORE SURGERY and the MORNING OF SURGERY with CHG soap.  2. If you choose to wash your hair, wash your hair first as usual with your normal shampoo.  3. After shampooing, rinse your hair and body thoroughly to remove the shampoo.  4. Use CHG as you would any other liquid soap. You can apply CHG directly to the skin and wash gently with a scrungie or a clean washcloth.  5. Apply the CHG soap to your body only from the neck down. Do not use on open wounds or open sores. Avoid contact with your eyes, ears, mouth, and genitals (private parts). Wash face and genitals (private parts) with your normal soap.  6. Wash thoroughly, paying special attention to the area where your surgery  will be performed.  7. Thoroughly rinse your body with warm water.  8. Do not shower/wash with your normal soap after using and rinsing off the CHG soap.  9. Pat yourself dry with a clean towel.  10. Wear clean pajamas to bed the night before surgery.  12. Place clean sheets on your bed the night of your first shower and do not sleep with pets.  13. Shower again with the CHG soap on the day of surgery prior to arriving at the hospital.  14. Do not apply any deodorants/lotions/powders.  15. Please wear clean clothes to the hospital.

## 2023-12-09 ENCOUNTER — Encounter: Payer: Self-pay | Admitting: Internal Medicine

## 2023-12-09 MED ORDER — GENTAMICIN SULFATE 40 MG/ML IJ SOLN
5.0000 mg/kg | INTRAVENOUS | Status: DC
  Filled 2023-12-09: qty 11.75

## 2023-12-09 MED ORDER — GENTAMICIN SULFATE 40 MG/ML IJ SOLN
5.0000 mg/kg | INTRAMUSCULAR | Status: AC
  Administered 2023-12-10: 472 mg via INTRAVENOUS
  Filled 2023-12-09: qty 11.75

## 2023-12-09 NOTE — Assessment & Plan Note (Signed)
 Seeing Dr Maree - f/u regarding occipital neuralgia vs post concussive headache syndrome. Also concern for increased ICP. Headache returned 03/2023 - saw Dr Herminio. MRI scheduled for 08/20/23. Last saw Dr Maree - 09/11/23 - partial relief with baclofen . Prescribed lyrica. New small lacunar stroke - right posterior cerebellum, felt likely due to microvascular ischemic changes. (Moderate to severe microvascular ischemic changes in MRI. Meningioma - well managed since 2016 - no mass effect. Recommended f/u in 5 months. Overall stable.

## 2023-12-09 NOTE — Assessment & Plan Note (Signed)
 Blood pressure as outlined.  On metoprolol.  Continue current medication. Follow pressures. Follow metabolic panel.

## 2023-12-09 NOTE — Assessment & Plan Note (Signed)
 Seeing Dr Babara - f/u iron  deficiency anemia. Last seen 09/10/23 - stable. Recommended to continue oral iron  supplementation.  Request to have checked today. Having increased RLS.

## 2023-12-09 NOTE — Assessment & Plan Note (Signed)
Colonoscopy 01/02/22 - COLON POLYP, TRANSVERSE; COLD BIOPSY:  - TUBULAR ADENOMA.  - NEGATIVE FOR HIGH-GRADE DYSPLASIA AND MALIGNANCY.  

## 2023-12-09 NOTE — Assessment & Plan Note (Signed)
 Seeing urology for mixed incontinence. Scheduled for insertion of sacral nerve stimulator (interstim). Temporary helped her bladder issues and her back pain and her she was able to move her bowels more regularly.

## 2023-12-09 NOTE — Assessment & Plan Note (Signed)
 Continue Crestor .  Low-cholesterol diet and exercise.  Check lipid panel.

## 2023-12-09 NOTE — Assessment & Plan Note (Signed)
 Low-carb diet and exercise.  Follow met b and A1c.

## 2023-12-09 NOTE — Assessment & Plan Note (Signed)
 She has severe issues with gastroesophageal reflux she has been noted to have esophageal dysmotility and a hiatal hernia that is at least 5 cm in size. Taking PPI, and previously had increased issues with some increased mucus and regurgitation and intermittent nausea. This is better now. Swallowing better. No increased acid reflux.

## 2023-12-09 NOTE — Assessment & Plan Note (Signed)
 Sees Dr Tamea for moderate persistent asthma. CT chest showed no ILD, very large hiatal hernia with intrathoracic stomach. Continue Trelegy. Breathing stable.

## 2023-12-10 ENCOUNTER — Encounter: Payer: Self-pay | Admitting: Urology

## 2023-12-10 ENCOUNTER — Ambulatory Visit
Admission: RE | Admit: 2023-12-10 | Discharge: 2023-12-10 | Disposition: A | Discharge status: Home / Self Care | Attending: Urology | Admitting: Urology

## 2023-12-10 ENCOUNTER — Other Ambulatory Visit: Payer: Self-pay

## 2023-12-10 ENCOUNTER — Ambulatory Visit

## 2023-12-10 ENCOUNTER — Ambulatory Visit: Payer: Self-pay | Admitting: Urgent Care

## 2023-12-10 ENCOUNTER — Encounter
Admission: RE | Disposition: A | Discharge status: Home / Self Care | Payer: Self-pay | Source: Home / Self Care | Attending: Urology

## 2023-12-10 DIAGNOSIS — K449 Diaphragmatic hernia without obstruction or gangrene: Secondary | ICD-10-CM | POA: Diagnosis not present

## 2023-12-10 DIAGNOSIS — K219 Gastro-esophageal reflux disease without esophagitis: Secondary | ICD-10-CM | POA: Diagnosis not present

## 2023-12-10 DIAGNOSIS — Z79899 Other long term (current) drug therapy: Secondary | ICD-10-CM | POA: Diagnosis not present

## 2023-12-10 DIAGNOSIS — N3941 Urge incontinence: Secondary | ICD-10-CM

## 2023-12-10 DIAGNOSIS — I10 Essential (primary) hypertension: Secondary | ICD-10-CM | POA: Insufficient documentation

## 2023-12-10 DIAGNOSIS — Z7982 Long term (current) use of aspirin: Secondary | ICD-10-CM | POA: Insufficient documentation

## 2023-12-10 DIAGNOSIS — Z01812 Encounter for preprocedural laboratory examination: Secondary | ICD-10-CM

## 2023-12-10 DIAGNOSIS — R739 Hyperglycemia, unspecified: Secondary | ICD-10-CM

## 2023-12-10 DIAGNOSIS — Z8673 Personal history of transient ischemic attack (TIA), and cerebral infarction without residual deficits: Secondary | ICD-10-CM | POA: Insufficient documentation

## 2023-12-10 DIAGNOSIS — J45909 Unspecified asthma, uncomplicated: Secondary | ICD-10-CM | POA: Insufficient documentation

## 2023-12-10 DIAGNOSIS — E785 Hyperlipidemia, unspecified: Secondary | ICD-10-CM

## 2023-12-10 DIAGNOSIS — E1165 Type 2 diabetes mellitus with hyperglycemia: Secondary | ICD-10-CM

## 2023-12-10 DIAGNOSIS — N3946 Mixed incontinence: Secondary | ICD-10-CM | POA: Insufficient documentation

## 2023-12-10 HISTORY — PX: INTERSTIM IMPLANT PLACEMENT: SHX5130

## 2023-12-10 LAB — GLUCOSE, CAPILLARY: Glucose-Capillary: 150 mg/dL — ABNORMAL HIGH (ref 70–99)

## 2023-12-10 SURGERY — INSERTION, SACRAL NERVE STIMULATOR, INTERSTIM, STAGE 1
Anesthesia: Monitor Anesthesia Care

## 2023-12-10 MED ORDER — CHLORHEXIDINE GLUCONATE 0.12 % MT SOLN
OROMUCOSAL | Status: AC
  Filled 2023-12-10: qty 15

## 2023-12-10 MED ORDER — PROPOFOL 10 MG/ML IV BOLUS
INTRAVENOUS | Status: DC | PRN
  Administered 2023-12-10: 30 mg via INTRAVENOUS

## 2023-12-10 MED ORDER — OXYCODONE HCL 5 MG/5ML PO SOLN: 5.0000 mg | Freq: Once | ORAL | Status: DC | PRN

## 2023-12-10 MED ORDER — BUPIVACAINE-EPINEPHRINE (PF) 0.5% -1:200000 IJ SOLN
INTRAMUSCULAR | Status: AC
  Filled 2023-12-10: qty 30

## 2023-12-10 MED ORDER — PROPOFOL 500 MG/50ML IV EMUL
INTRAVENOUS | Status: DC | PRN
  Administered 2023-12-10: 125 ug/kg/min via INTRAVENOUS

## 2023-12-10 MED ORDER — BUPIVACAINE-EPINEPHRINE 0.5% -1:200000 IJ SOLN
INTRAMUSCULAR | Status: DC | PRN
  Administered 2023-12-10: 20 mL via INTRAMUSCULAR

## 2023-12-10 MED ORDER — FENTANYL CITRATE (PF) 100 MCG/2ML IJ SOLN
INTRAMUSCULAR | Status: AC
  Filled 2023-12-10: qty 2

## 2023-12-10 MED ORDER — VANCOMYCIN HCL IN DEXTROSE 1-5 GM/200ML-% IV SOLN
INTRAVENOUS | Status: AC
Start: 2023-12-10 — End: 2023-12-10
  Filled 2023-12-10: qty 200

## 2023-12-10 MED ORDER — DEXMEDETOMIDINE HCL IN NACL 80 MCG/20ML IV SOLN
INTRAVENOUS | Status: DC | PRN
  Administered 2023-12-10: 6 ug via INTRAVENOUS

## 2023-12-10 MED ORDER — ONDANSETRON HCL 4 MG/2ML IJ SOLN
INTRAMUSCULAR | Status: AC
  Filled 2023-12-10: qty 2

## 2023-12-10 MED ORDER — 0.9 % SODIUM CHLORIDE (POUR BTL) OPTIME
TOPICAL | Status: DC | PRN
  Administered 2023-12-10: 500 mL

## 2023-12-10 MED ORDER — HYDROCODONE-ACETAMINOPHEN 5-325 MG PO TABS: 1.0000 | ORAL_TABLET | Freq: Four times a day (QID) | ORAL | 0 refills | Status: AC | PRN

## 2023-12-10 MED ORDER — ONDANSETRON HCL 4 MG/2ML IJ SOLN
INTRAMUSCULAR | Status: DC | PRN
  Administered 2023-12-10: 4 mg via INTRAVENOUS

## 2023-12-10 MED ORDER — MIDAZOLAM HCL 2 MG/2ML IJ SOLN
INTRAMUSCULAR | Status: AC
  Filled 2023-12-10: qty 2

## 2023-12-10 MED ORDER — ACETAMINOPHEN 10 MG/ML IV SOLN: 1000.0000 mg | Freq: Once | INTRAVENOUS | Status: DC | PRN

## 2023-12-10 MED ORDER — LIDOCAINE-EPINEPHRINE 1 %-1:100000 IJ SOLN
INTRAMUSCULAR | Status: AC
  Filled 2023-12-10: qty 1

## 2023-12-10 MED ORDER — ORAL CARE MOUTH RINSE: 15.0000 mL | Freq: Once | OROMUCOSAL | Status: AC

## 2023-12-10 MED ORDER — SULFAMETHOXAZOLE-TRIMETHOPRIM 800-160 MG PO TABS: 1.0000 | ORAL_TABLET | Freq: Two times a day (BID) | ORAL | 0 refills | Status: DC

## 2023-12-10 MED ORDER — FENTANYL CITRATE (PF) 100 MCG/2ML IJ SOLN: 25.0000 ug | INTRAMUSCULAR | Status: DC | PRN

## 2023-12-10 MED ORDER — CHLORHEXIDINE GLUCONATE 0.12 % MT SOLN
15.0000 mL | Freq: Once | OROMUCOSAL | Status: AC
  Administered 2023-12-10: 15 mL via OROMUCOSAL

## 2023-12-10 MED ORDER — SODIUM CHLORIDE 0.9 % IV SOLN: INTRAVENOUS | Status: DC

## 2023-12-10 MED ORDER — OXYCODONE HCL 5 MG PO TABS: 5.0000 mg | ORAL_TABLET | Freq: Once | ORAL | Status: DC | PRN

## 2023-12-10 MED ORDER — BUPIVACAINE HCL (PF) 0.5 % IJ SOLN
INTRAMUSCULAR | Status: AC
  Filled 2023-12-10: qty 30

## 2023-12-10 MED ORDER — DROPERIDOL 2.5 MG/ML IJ SOLN: 0.6250 mg | Freq: Once | INTRAMUSCULAR | Status: DC | PRN

## 2023-12-10 MED ORDER — VANCOMYCIN HCL IN DEXTROSE 1-5 GM/200ML-% IV SOLN
1000.0000 mg | INTRAVENOUS | Status: AC
  Administered 2023-12-10: 1000 mg via INTRAVENOUS

## 2023-12-10 MED ORDER — PROPOFOL 10 MG/ML IV BOLUS
INTRAVENOUS | Status: AC
  Filled 2023-12-10: qty 20

## 2023-12-10 SURGICAL SUPPLY — 41 items
CHLORAPREP W/TINT 26 (MISCELLANEOUS) ×1 IMPLANT
COVER MAYO STAND STRL (DRAPES) ×1 IMPLANT
COVER PROBE FLX POLY STRL (MISCELLANEOUS) ×1 IMPLANT
DERMABOND ADVANCED .7 DNX12 (GAUZE/BANDAGES/DRESSINGS) ×1 IMPLANT
DRAPE C-ARM 42X72 X-RAY (DRAPES) ×1 IMPLANT
DRAPE INCISE 23X17 STRL (DRAPES) IMPLANT
DRAPE INCISE IOBAN 66X45 STRL (DRAPES) ×1 IMPLANT
DRAPE LAPAROTOMY TRNSV 106X77 (MISCELLANEOUS) ×1 IMPLANT
DRAPE TABLE BACK 80X90 (DRAPES) ×1 IMPLANT
DRSG TEGADERM 2-3/8X2-3/4 SM (GAUZE/BANDAGES/DRESSINGS) ×1 IMPLANT
DRSG TEGADERM 4X4.75 (GAUZE/BANDAGES/DRESSINGS) ×1 IMPLANT
DRSG TEGADERM 6X8 (GAUZE/BANDAGES/DRESSINGS) IMPLANT
DRSG TELFA 3X8 NADH STRL (GAUZE/BANDAGES/DRESSINGS) ×1 IMPLANT
ELECTRODE REM PT RTRN 9FT ADLT (ELECTROSURGICAL) IMPLANT
GAUZE 4X4 16PLY ~~LOC~~+RFID DBL (SPONGE) ×1 IMPLANT
GAUZE SPONGE 4X4 12PLY STRL (GAUZE/BANDAGES/DRESSINGS) ×1 IMPLANT
GLOVE BIO SURGEON STRL SZ7.5 (GLOVE) ×1 IMPLANT
GOWN STRL REUS W/ TWL XL LVL3 (GOWN DISPOSABLE) ×1 IMPLANT
KIT HANDSET INTERSTIM COMM (NEUROSURGERY SUPPLIES) IMPLANT
KIT TURNOVER CYSTO (KITS) ×1 IMPLANT
KIT TURNOVER KIT A (KITS) ×1 IMPLANT
LEAD INTERSTIM 4.32 28 L (Lead) IMPLANT
MANIFOLD NEPTUNE II (INSTRUMENTS) ×1 IMPLANT
NDL HYPO 22X1.5 SAFETY MO (MISCELLANEOUS) IMPLANT
NEEDLE HYPO 22X1.5 SAFETY MO (MISCELLANEOUS) ×1 IMPLANT
NEUROSTIMULATOR 1.7X2X.06 (UROLOGICAL SUPPLIES) IMPLANT
PACK BASIN MINOR ARMC (MISCELLANEOUS) ×1 IMPLANT
STIMULATOR INTERSTIM 2X1.7X.3 (Miscellaneous) ×1 IMPLANT
SUT MNCRL AB 4-0 PS2 18 (SUTURE) IMPLANT
SUT VIC AB 3-0 SH 27X BRD (SUTURE) ×2 IMPLANT
SUT VIC AB 4-0 RB1 27X BRD (SUTURE) ×3 IMPLANT
SUT VICRYL 3-0 CR8 SH (SUTURE) IMPLANT
SUT VICRYL 3-0 RB1 18 ABS (SUTURE) IMPLANT
SUTURE MNCRL 4-0 27XMF (SUTURE) ×1 IMPLANT
SUTURE VICRYL 4-0 27 PS-2 BART (SUTURE) ×1 IMPLANT
SYR BULB IRRIG 60ML STRL (SYRINGE) ×1 IMPLANT
SYR CONTROL 10ML LL (SYRINGE) IMPLANT
TOWEL OR 17X26 4PK STRL BLUE (TOWEL DISPOSABLE) ×2 IMPLANT
TRAP FLUID SMOKE EVACUATOR (MISCELLANEOUS) ×1 IMPLANT
TUBING CONNECTING 10 (TUBING) IMPLANT
WATER STERILE IRR 500ML POUR (IV SOLUTION) ×1 IMPLANT

## 2023-12-10 NOTE — Anesthesia Postprocedure Evaluation (Signed)
 Anesthesia Post Note  Patient: Angela Cohen  Procedure(s) Performed: INSERTION, SACRAL NERVE STIMULATOR, INTERSTIM, STAGE 1 INSERTION, SACRAL NERVE STIMULATOR, INTERSTIM, STAGE 2  Patient location during evaluation: PACU Anesthesia Type: MAC Level of consciousness: awake and alert Pain management: pain level controlled Vital Signs Assessment: post-procedure vital signs reviewed and stable Respiratory status: spontaneous breathing, nonlabored ventilation, respiratory function stable and patient connected to nasal cannula oxygen Cardiovascular status: blood pressure returned to baseline and stable Postop Assessment: no apparent nausea or vomiting Anesthetic complications: no   No notable events documented.   Last Vitals:  Vitals:   12/10/23 1430 12/10/23 1446  BP: (!) 144/70 (!) 146/67  Pulse: 71 80  Resp: 16 18  Temp: (!) 36.1 C (!) 36.2 C  SpO2: 96% 95%    Last Pain:  Vitals:   12/10/23 1446  TempSrc: Temporal  PainSc: 0-No pain                 Lynwood KANDICE Clause

## 2023-12-10 NOTE — Anesthesia Preprocedure Evaluation (Signed)
 Anesthesia Evaluation  Patient identified by MRN, date of birth, ID band Patient awake    Reviewed: Allergy & Precautions, H&P , NPO status , Patient's Chart, lab work & pertinent test results, reviewed documented beta blocker date and time   History of Anesthesia Complications (+) PONV and history of anesthetic complications  Airway Mallampati: III  TM Distance: >3 FB Neck ROM: full    Dental no notable dental hx. (+) Teeth Intact   Pulmonary shortness of breath and with exertion, asthma , pneumonia   Pulmonary exam normal breath sounds clear to auscultation       Cardiovascular Exercise Tolerance: Poor hypertension, On Medications negative cardio ROS  Rhythm:regular Rate:Normal     Neuro/Psych  Headaches  Neuromuscular disease CVA  negative psych ROS   GI/Hepatic Neg liver ROS, hiatal hernia,GERD  Medicated,,  Endo/Other  negative endocrine ROSdiabetes    Renal/GU      Musculoskeletal   Abdominal   Peds  Hematology negative hematology ROS (+)   Anesthesia Other Findings   Reproductive/Obstetrics negative OB ROS                              Anesthesia Physical Anesthesia Plan  ASA: 3  Anesthesia Plan: MAC   Post-op Pain Management:    Induction:   PONV Risk Score and Plan: 4 or greater  Airway Management Planned:   Additional Equipment:   Intra-op Plan:   Post-operative Plan:   Informed Consent: I have reviewed the patients History and Physical, chart, labs and discussed the procedure including the risks, benefits and alternatives for the proposed anesthesia with the patient or authorized representative who has indicated his/her understanding and acceptance.       Plan Discussed with: CRNA  Anesthesia Plan Comments:         Anesthesia Quick Evaluation

## 2023-12-10 NOTE — Transfer of Care (Signed)
 Immediate Anesthesia Transfer of Care Note  Patient: Angela Cohen  Procedure(s) Performed: INSERTION, SACRAL NERVE STIMULATOR, INTERSTIM, STAGE 1 INSERTION, SACRAL NERVE STIMULATOR, INTERSTIM, STAGE 2  Patient Location: PACU  Anesthesia Type:MAC  Level of Consciousness: drowsy and patient cooperative  Airway & Oxygen Therapy: Patient Spontanous Breathing and Patient connected to face mask oxygen  Post-op Assessment: Report given to RN and Post -op Vital signs reviewed and stable  Post vital signs: stable  Last Vitals:  Vitals Value Taken Time  BP    Temp    Pulse 102 12/10/23 13:47  Resp    SpO2 100 % 12/10/23 13:47  Vitals shown include unfiled device data.  Last Pain:  Vitals:   12/10/23 1144  TempSrc: Temporal  PainSc: 0-No pain         Complications: No notable events documented.

## 2023-12-10 NOTE — Op Note (Signed)
 Preoperative diagnosis: Refractory urgency incontinence Postoperative diagnosis: Refractory urgency incontinence Surgery: Stage I and stage II implantation of InterStim and impedance check Surgeon: Dr. Glendia Gyanna Jarema  The patient has the above diagnoses and consent of the above procedure.  Preoperative antibiotics were given.  She was placed in prone position.  She she was prepped and draped with the usual technique.  Under fluoroscopic guidance I marked the S3 foramina bilaterally.  I used 10 cc of lidocaine  epinephrine  mixture on her right side.  I easily located the S3 foramina.  I used a 3.5 inch foramen needle and she had excellent bellows and toe response at low amplitude  I passed the guide to the appropriate depth after removing the inner aspect of the foramen needle.  Scalpel incision was made.  White trocar was placed the appropriate depth.  Well-prepared lead was placed but there was some resistance.  I replaced the trocar and advanced a few more millimeters.  Lead was then placed to the appropriate depth.  She had excellent bellows and toe response at all for lead settings.  Under fluoroscopic guidance I removed the white trocar as described.  X-rays were taken lateral and an AP and the lead was in excellent position  With soft tissue and bony landmarks are marked for 9 cm right buttock incision.  20 cc of lidocaine  epinephrine  mixture utilized.  Appropriate scalpel incision dissecting down through skin and subcutaneous tissue was made.  I also used cautery.  I then did some finger dissection.  She had good adipose tissue and I was not on fat  I passed the lead from medial to lateral at the appropriate depth with good technique.  Lead was placed in the IPG.  Screwdriver was used  IPG was placed into the pocket tension-free.  Impedance was normal in all 4 leads positions  I closed the right buttock incision with a running 3-0 Vicryl subcutaneous followed by 4-0 Monocryl's  subcuticular.  I closed the midline incision with 3 interrupted 4-0 Vicryl sutures.  Sterile dressing was applied.  I was very pleased with the surgery and hopefully greatly improves her continence

## 2023-12-10 NOTE — Interval H&P Note (Signed)
 History and Physical Interval Note:  12/10/2023 11:52 AM  Angela Cohen  has presented today for surgery, with the diagnosis of Mixed Incontinence  Urge Incontinence.  The various methods of treatment have been discussed with the patient and family. After consideration of risks, benefits and other options for treatment, the patient has consented to  Procedure(s) with comments: INSERTION, SACRAL NERVE STIMULATOR, INTERSTIM, STAGE 1 (N/A) - Impedance Check INSERTION, SACRAL NERVE STIMULATOR, INTERSTIM, STAGE 2 (N/A) as a surgical intervention.  The patient's history has been reviewed, patient examined, no change in status, stable for surgery.  I have reviewed the patient's chart and labs.  Questions were answered to the patient's satisfaction.     Jerzi Tigert A Alexyss Balzarini

## 2023-12-10 NOTE — Progress Notes (Signed)
 Incontinence stable  Resp and cv exam normal Post op discussed

## 2023-12-10 NOTE — Discharge Instructions (Signed)
 I have reviewed discharge instructions in detail with the patient. They will follow-up with me or their physician as scheduled. My nurse will also be calling the patients as per protocol.   Remove dressings Saturday  I will call tomorrow with f/up etc.

## 2023-12-10 NOTE — Progress Notes (Signed)
 Use TED compression as much as possible to prevent blood clots as discussed with patient

## 2023-12-11 ENCOUNTER — Telehealth: Payer: Self-pay

## 2023-12-11 ENCOUNTER — Telehealth: Payer: Self-pay | Admitting: Oncology

## 2023-12-11 ENCOUNTER — Encounter: Payer: Self-pay | Admitting: Urology

## 2023-12-11 ENCOUNTER — Other Ambulatory Visit: Payer: Self-pay

## 2023-12-11 NOTE — Telephone Encounter (Signed)
 Called pt for 1 day post op check in per verbal from Dr.MacDiarmid. No answer from pt, LM advising pt to call office if she is experiencing any issues.

## 2023-12-11 NOTE — Telephone Encounter (Signed)
 Spoke to the patient to get her scheduled for md/ venofer  this week or next. She did not want to appt. She wanted to keep the appt already scheduled for the end of august

## 2023-12-11 NOTE — Telephone Encounter (Signed)
-----   Message from Nurse Almarie RAMAN sent at 12/11/2023  1:49 PM EDT -----  ----- Message ----- From: Herold Almarie PARAS, RN Sent: 12/11/2023   9:28 AM EDT To: Almarie PARAS Herold, RN; Fonda KANDICE Sax, CMA#   ----- Message ----- From: Babara Call, MD Sent: 12/10/2023  11:39 PM EDT To: Almarie PARAS Herold, RN; Fonda KANDICE Sax, CMA  Please mover up her August follow up to be this week for MD + Venofer .  Thanks.   Zhou ----- Message ----- From: Glendia Shad, MD Sent: 12/04/2023   3:28 PM EDT To: Call Babara, MD  FYI. She was having increased fatigue and restless legs. Wanted her hgb and iron  checked.  She is aware of lab results. Wanted you to be aware.  Thank you for helping to take care of her.   ----- Message ----- From: Norine Skelton, CMA Sent: 12/04/2023   1:43 PM EDT To: Shad Glendia, MD  Spoke to pt. Notified pt of the results. Pt stated that she had decreased her iron  and thinks that's why her symptoms had returned pt stated that she is now taking one pill everyday vs one every  week. Pt stated that she has an appt with Dr. Babara on 01/08/24 and would like to hold off on scheduling a sooner appt due to having a pacemaker placed next week. Pt did confirm she is feeling a lot  better since her visit.   Labs forwarded to Dr. BABARA via efax  ----- Message ----- From: Glendia Shad, MD Sent: 12/04/2023   6:10 AM EDT To: Glendia Clinical  Please notify - overall sugar control increased some from last check. Continue low carb diet and exercise. We will follow. kidney function is relatively stable. Cholesterol levels are ok. B12 level  is ok - not low. Hgb has decreased from last check. She has been seeing Dr Babara for anemia. Ms Schiff was having some increased fatigue and increased restless legs and wanted blood counts checked  through our office.  Given decrease in hgb and increased symptoms, please contact Dr Layvonne office and forward lab results and see if needs earlier f/u.  Let me know if  I need to contact or if any  problems.  ----- Message ----- From: Interface, [facility] Sent: 12/03/2023   3:45 PM EDT To: Shad Glendia, MD

## 2023-12-12 ENCOUNTER — Encounter: Payer: Self-pay | Admitting: Oncology

## 2023-12-12 NOTE — Telephone Encounter (Signed)
 Pt declined to move up appts due to recent surgery on 7/28. Dr. Glendia notified.

## 2023-12-13 ENCOUNTER — Ambulatory Visit

## 2023-12-13 ENCOUNTER — Ambulatory Visit: Admitting: Oncology

## 2023-12-17 ENCOUNTER — Ambulatory Visit: Payer: Medicare Other

## 2023-12-24 ENCOUNTER — Ambulatory Visit

## 2023-12-24 DIAGNOSIS — M810 Age-related osteoporosis without current pathological fracture: Secondary | ICD-10-CM

## 2023-12-24 MED ORDER — DENOSUMAB 60 MG/ML ~~LOC~~ SOSY
60.0000 mg | PREFILLED_SYRINGE | SUBCUTANEOUS | Status: AC
Start: 2024-06-25 — End: ?

## 2023-12-24 NOTE — Progress Notes (Signed)
Pt presented for their subcutaneous Prolia injection. Pt was identified through two identifiers. Pt was given the information packets about the Prolia and told to schedule their next injection 6 months out. Pt tolerated the subq injection well in the right arm.  

## 2023-12-28 ENCOUNTER — Ambulatory Visit

## 2023-12-28 ENCOUNTER — Ambulatory Visit: Admitting: Oncology

## 2023-12-30 ENCOUNTER — Ambulatory Visit (INDEPENDENT_AMBULATORY_CARE_PROVIDER_SITE_OTHER)

## 2023-12-30 ENCOUNTER — Ambulatory Visit: Payer: Self-pay | Admitting: Emergency Medicine

## 2023-12-30 ENCOUNTER — Ambulatory Visit
Admission: EM | Admit: 2023-12-30 | Discharge: 2023-12-30 | Disposition: A | Discharge status: Home / Self Care | Attending: Emergency Medicine | Admitting: Emergency Medicine

## 2023-12-30 ENCOUNTER — Encounter: Payer: Self-pay | Admitting: Emergency Medicine

## 2023-12-30 DIAGNOSIS — M25571 Pain in right ankle and joints of right foot: Secondary | ICD-10-CM

## 2023-12-30 DIAGNOSIS — S93401A Sprain of unspecified ligament of right ankle, initial encounter: Secondary | ICD-10-CM

## 2023-12-30 DIAGNOSIS — M79671 Pain in right foot: Secondary | ICD-10-CM | POA: Diagnosis not present

## 2023-12-30 DIAGNOSIS — M25531 Pain in right wrist: Secondary | ICD-10-CM | POA: Diagnosis not present

## 2023-12-30 DIAGNOSIS — S93601A Unspecified sprain of right foot, initial encounter: Secondary | ICD-10-CM

## 2023-12-30 DIAGNOSIS — S63501A Unspecified sprain of right wrist, initial encounter: Secondary | ICD-10-CM | POA: Diagnosis not present

## 2023-12-30 NOTE — Discharge Instructions (Signed)
 Did not appreciate any obvious fracture on your wrist, ankle or foot x-rays.  Will contact you if the radiology overread differs enough from mine and we need to change management.  Wear the ankle and wrist brace as needed for comfort.  Please follow-up with [facility] orthopedics if not better in a week to 10 days.  You may have injured something called TFCC in your wrist.  Continue ice.  May take 400 mg of ibuprofen with 1000 mg of Tylenol  3 or 4 times a day as needed for pain.  Do not exceed 4000 mg of Tylenol  from all sources in 24 hours.

## 2023-12-30 NOTE — ED Triage Notes (Signed)
 Patient states that she stepped in a hole in the ground and fell in the grass yesterday.  Patient c/o right wrist, right ankle and right foot pain.  Patient denies hitting her head and no LOC.

## 2023-12-30 NOTE — ED Provider Notes (Signed)
 HPI  SUBJECTIVE:  Angela Cohen is a right-handed 83 y.o. female who presents with constant, throbbing, tight right lateral wrist pain, right lateral ankle pain, swelling after stepping in a hole yesterday while walking across the yard.  Patient does not remember how she turned her ankle, but states she fell onto an outstretched right hand.  She reports lateral soft tissue swelling of the ankle but no limited motion.  No bruising, numbness tingling of the foot.  She denies injury to the knee or foot.  She has tried ice, Tylenol  1000 mg alternating with 400 mg of ibuprofen every 4 hours with improvement in her symptoms.  Symptoms worse with walking and movement.  She reports ulnar wrist pain, swelling, no bruising.  She denies injury to the elbow or hand.  She reports mild limitation of motion of the wrist secondary to pain.  Tylenol  and ibuprofen help.  Symptoms worse with palpation and movement.  She denies hitting her head, loss of consciousness, neck pain, preceding chest pain, shortness of breath or syncope causing the fall.  She has a past medical history of asthma,  gastritis, GERD, hypercholesterolemia, hypertension, dizziness, CVA, osteoporosis.  Denies history of diabetes.  PCP: [facility].   Past Medical History:  Diagnosis Date   Aortic atherosclerosis (HCC)    Asthma    Bilateral occipital neuralgia    Bilateral occipital neuralgia    Cancer (HCC) 07/10/2023   melanoma   Diabetes mellitus without complication (HCC)    Dyspnea    Fibrocystic breast disease    Gastritis    EGD - schatzki's ring, gastritis and gastric polyps   GERD (gastroesophageal reflux disease)    Hiatal hernia    History of colon polyps    Hypercholesterolemia    Hypertension    IDA (iron  deficiency anemia)    MAI (mycobacterium avium-intracellulare) infection (HCC)    Meningioma (HCC)    Mixed incontinence    Osteoporosis    Pneumonia    PONV (postoperative nausea and vomiting)     Reflux esophagitis    Small vessel stroke [facility])     Past Surgical History:  Procedure Laterality Date   ABDOMINAL HYSTERECTOMY  1998   bladder tact     BREAST EXCISIONAL BIOPSY Bilateral 1970's   benign   BREAST EXCISIONAL BIOPSY Bilateral 2010   pt  stated dr claudene did bilat axilla lumps neg    BREAST SURGERY     cyst(benign)   CATARACT EXTRACTION W/PHACO Left 06/13/2021   Procedure: CATARACT EXTRACTION PHACO AND INTRAOCULAR LENS PLACEMENT (IOC) LEFT;  Surgeon: Myrna Adine Anes, MD;  Location: [facility];  Service: Ophthalmology;  Laterality: Left;  7.04 0:40.5   CATARACT EXTRACTION W/PHACO Right 06/27/2021   Procedure: CATARACT EXTRACTION PHACO AND INTRAOCULAR LENS PLACEMENT (IOC) RIGHT;  Surgeon: Myrna Adine Anes, MD;  Location: [facility];  Service: Ophthalmology;  Laterality: Right;  3.32 0:30.7   COLONOSCOPY N/A 09/28/2014   Procedure: COLONOSCOPY;  Surgeon: Lamar ONEIDA Holmes, MD;  Location: [facility] ENDOSCOPY;  Service: Endoscopy;  Laterality: N/A;   COLONOSCOPY WITH PROPOFOL  N/A 01/02/2022   Procedure: COLONOSCOPY WITH PROPOFOL ;  Surgeon: Maryruth Ole ONEIDA, MD;  Location: [facility] ENDOSCOPY;  Service: Endoscopy;  Laterality: N/A;   ESOPHAGOGASTRODUODENOSCOPY N/A 09/28/2014   Procedure: ESOPHAGOGASTRODUODENOSCOPY (EGD);  Surgeon: Lamar ONEIDA Holmes, MD;  Location: [facility] ENDOSCOPY;  Service: Endoscopy;  Laterality: N/A;   ESOPHAGOGASTRODUODENOSCOPY (EGD) WITH PROPOFOL  N/A 04/26/2016   Procedure: ESOPHAGOGASTRODUODENOSCOPY (EGD) WITH PROPOFOL ;  Surgeon: Lamar ONEIDA Holmes,  MD;  Location: [facility] ENDOSCOPY;  Service: Endoscopy;  Laterality: N/A;   ESOPHAGOGASTRODUODENOSCOPY (EGD) WITH PROPOFOL  N/A 01/02/2022   Procedure: ESOPHAGOGASTRODUODENOSCOPY (EGD) WITH PROPOFOL ;  Surgeon: Maryruth Ole DASEN, MD;  Location: [facility] ENDOSCOPY;  Service: Endoscopy;  Laterality: N/A;   excision of right axillary lipoma  02/2006   EYE SURGERY     FOOT SURGERY Right    FRACTURE SURGERY  2019    left shoulder repair   HEMORRHOID SURGERY     HERNIA REPAIR  02/2006   INTERSTIM IMPLANT PLACEMENT N/A 12/10/2023   Procedure: INSERTION, SACRAL NERVE STIMULATOR, INTERSTIM, STAGE 1;  Surgeon: Gaston Hamilton, MD;  Location: [facility] ORS;  Service: Urology;  Laterality: N/A;  Impedance Check   INTERSTIM IMPLANT PLACEMENT N/A 12/10/2023   Procedure: INSERTION, SACRAL NERVE STIMULATOR, INTERSTIM, STAGE 2;  Surgeon: Gaston Hamilton, MD;  Location: [facility] ORS;  Service: Urology;  Laterality: N/A;   NASAL SEPTUM SURGERY      Family History  Problem Relation Age of Onset   Heart disease Mother 31       heart attack   Cancer Father        lung   Diabetes Sister    Breast cancer Sister 56   Breast cancer Daughter 34   Stroke Brother    Diabetes Brother     Social History   Tobacco Use   Smoking status: Never    Passive exposure: Never   Smokeless tobacco: Never   Tobacco comments:    Never  Vaping Use   Vaping status: Never Used  Substance Use Topics   Alcohol use: No    Alcohol/week: 0.0 standard drinks of alcohol   Drug use: No     Current Facility-Administered Medications:    denosumab  (PROLIA ) injection 60 mg, 60 mg, Subcutaneous, Q6 months, Scott, Charlene, MD, 60 mg at 12/24/23 1403   [START ON 06/25/2024] denosumab  (PROLIA ) injection 60 mg, 60 mg, Subcutaneous, Q6 months, Hamilton Shad, MD  Current Outpatient Medications:    acetaminophen  (TYLENOL ) 500 MG tablet, Take 1,000 mg by mouth daily., Disp: , Rfl:    aspirin 81 MG EC tablet, Take 81 mg by mouth at bedtime. Swallow whole., Disp: , Rfl:    baclofen  (LIORESAL ) 10 MG tablet, Take 10 mg by mouth 2 (two) times daily., Disp: , Rfl:    Calcium  Carb-Cholecalciferol (CALCIUM +D3 PO), Take 2 tablets by mouth at bedtime., Disp: , Rfl:    Collagen-Vitamin C-Biotin (COLLAGEN PO), Take 2 tablets by mouth at bedtime., Disp: , Rfl:    diphenhydrAMINE (BENADRYL) 25 MG tablet, Take 50 mg by mouth at bedtime., Disp: , Rfl:     ELDERBERRY PO, Take 50 mg by mouth at bedtime., Disp: , Rfl:    ferrous sulfate 325 (65 FE) MG tablet, Take 325 mg by mouth every Sunday., Disp: , Rfl:    Fluticasone -Umeclidin-Vilant (TRELEGY ELLIPTA ) 100-62.5-25 MCG/ACT AEPB, Inhale 1 puff into the lungs daily. (Patient taking differently: Inhale 1 puff into the lungs daily as needed (shortness of breath).), Disp: 180 each, Rfl: 3   HYDROcodone -acetaminophen  (NORCO/VICODIN) 5-325 MG tablet, Take 1-2 tablets by mouth every 6 (six) hours as needed for moderate pain (pain score 4-6)., Disp: 14 tablet, Rfl: 0   ibuprofen (ADVIL) 200 MG tablet, Take 400 mg by mouth every evening., Disp: , Rfl:    Magnesium  400 MG TABS, Take 400 mg by mouth at bedtime., Disp: , Rfl:    Melatonin 10 MG TABS, Take 20 mg by mouth at bedtime., Disp: ,  Rfl:    metoprolol  succinate (TOPROL -XL) 25 MG 24 hr tablet, Take 1 tablet (25 mg total) by mouth 2 (two) times daily., Disp: 180 tablet, Rfl: 1   montelukast  (SINGULAIR ) 10 MG tablet, Take 1 tablet (10 mg total) by mouth at bedtime., Disp: 90 tablet, Rfl: 3   Multiple Vitamin (MULTIVITAMIN WITH MINERALS) TABS, Take 2 tablets by mouth at bedtime., Disp: , Rfl:    Omega-3 Fatty Acids (FISH OIL) 1200 MG CAPS, Take 2,400 mg by mouth daily., Disp: , Rfl:    ondansetron  (ZOFRAN ) 4 MG tablet, Take 1 tablet (4 mg total) by mouth 2 (two) times daily as needed for nausea or vomiting., Disp: 30 tablet, Rfl: 0   pantoprazole  (PROTONIX ) 40 MG tablet, Take 1 tablet (40 mg total) by mouth 2 (two) times daily before a meal., Disp: 180 tablet, Rfl: 1   Potassium 99 MG TABS, Take 99 mg by mouth at bedtime., Disp: , Rfl:    pregabalin (LYRICA) 50 MG capsule, Take 50 mg by mouth 2 (two) times daily., Disp: , Rfl:    rosuvastatin  (CRESTOR ) 10 MG tablet, TAKE 1 TABLET DAILY (Patient taking differently: Take 10 mg by mouth at bedtime.), Disp: 90 tablet, Rfl: 3   vitamin B-12 (CYANOCOBALAMIN ) 500 MCG tablet, Take 1,000 mcg by mouth at bedtime., Disp:  , Rfl:   Allergies  Allergen Reactions   Myrbetriq [Mirabegron] Other (See Comments)    Dizziness    Tape Itching and Rash     ROS  As noted in HPI.   Physical Exam  BP 136/80 (BP Location: Right Arm)   Pulse 78   Temp 98 F (36.7 C) (Oral)   Resp 15   Ht 5' 4 (1.626 m)   Wt 92.5 kg   SpO2 93%   BMI 35.00 kg/m   Constitutional: Well developed, well nourished, no acute distress Eyes: PERRL, EOMI, conjunctiva normal bilaterally HENT: Normocephalic, atraumatic,mucus membranes moist Respiratory: Normal inspiratory Cardiovascular: Normal rate GI: Nondistended skin: No rash, skin intact Musculoskeletal:  R   distal radius NT, distal ulnar styloid tender, snuffbox NT, carpals NT , metacarpals NT  digits NT , TFCC tender. no pain with supination,  no pain with pronation, pain with radial / ulnar deviation.  Pain with flexion/extension.  Motor intact in the median/radial/ulnar distribution, Sensation LT to hand normal. Rp 2+.  No bruising, erythema, edema. Elbow and proximal forearm NT.   R Ankle: Proximal fibula NT , Distal fibula tender, Medial malleolus NT   Deltoid ligaments medially NT ,  ATFL tender, calcaneofibular ligament tender, posterior tablofibular ligament tender ,  Achilles NT, calcaneus NT,  Proximal 5th metatarsal tender, Midfoot NT, distal NVI with baseline sensation / motor to foot with DP 2+. no pain with dorsiflexion/plantar flexion.  Pain with inversion, no pain eversion. - bruising.  Soft tissue swelling lateral ankle.  Ant drawer test stable. Pt able to bear weight in dept.   Neurologic: Alert & oriented x 3, CN III-XII grossly intact, no motor deficits, sensation grossly intact Psychiatric: Speech and behavior appropriate   ED Course   Medications - No data to display  Orders Placed This Encounter  Procedures   DG Wrist Complete Right    Standing Status:   Standing    Number of Occurrences:   1    Reason for Exam (SYMPTOM  OR DIAGNOSIS REQUIRED):    Fall, distal ulnar tenderness, tenderness at TFCC rule out fracture   DG Ankle Complete Right    Standing  Status:   Standing    Number of Occurrences:   1    Reason for Exam (SYMPTOM  OR DIAGNOSIS REQUIRED):   Fall, distal tibial and ligamentous tenderness, soft tissue swelling laterally rule out fracture   DG Foot Complete Right    Standing Status:   Standing    Number of Occurrences:   1    Reason for Exam (SYMPTOM  OR DIAGNOSIS REQUIRED):   Fall, base of fifth metatarsal tenderness rule out fracture   Apply Wrist brace    Standing Status:   Standing    Number of Occurrences:   1    Laterality:   Right   Apply ASO ankle    Standing Status:   Standing    Number of Occurrences:   1    Laterality:   Right   No results found for this or any previous visit (from the past 24 hours). DG Foot Complete Right Result Date: 12/30/2023 CLINICAL DATA:  Pain after a fall. EXAM: RIGHT FOOT COMPLETE - 3+ VIEW COMPARISON:  None Available. FINDINGS: No evidence for an acute fracture. No dislocation. Degenerative changes are seen in the MTP joint great toe. Degenerative changes also noted MTP joint of the middle toe in scattered IP joints. No worrisome lytic or sclerotic osseous abnormality. IMPRESSION: Degenerative changes without acute bony findings. Electronically Signed   By: Camellia Candle M.D.   On: 12/30/2023 09:16   DG Ankle Complete Right Result Date: 12/30/2023 CLINICAL DATA:  Pain after a fall. EXAM: RIGHT ANKLE - COMPLETE 3+ VIEW COMPARISON:  None Available. FINDINGS: There is no evidence of fracture, dislocation, or joint effusion. There is no evidence of arthropathy or other focal bone abnormality. Soft tissues are unremarkable. IMPRESSION: Negative. Electronically Signed   By: Camellia Candle M.D.   On: 12/30/2023 09:15   DG Wrist Complete Right Result Date: 12/30/2023 CLINICAL DATA:  Status post fall.  Pain. EXAM: RIGHT WRIST - COMPLETE 3+ VIEW COMPARISON:  None Available. FINDINGS: No evidence  for an acute fracture. No subluxation or dislocation. Degenerative changes are seen at the first carpometacarpal joint and in the radial carpus. Mineralization of the TFCC evident. IMPRESSION: Degenerative changes without acute bony findings. Electronically Signed   By: Camellia Candle M.D.   On: 12/30/2023 09:15    ED Clinical Impression  1. Sprain of right wrist, initial encounter   2. Acute right ankle pain   3. Right foot pain   4. Right wrist pain   5. Sprain of right ankle, unspecified ligament, initial encounter   6. Sprain of right foot, initial encounter      ED Assessment/Plan     Patient presents with mechanical fall yesterday.  No evidence of head injury or syncope causing fall.  Calculated creatinine clearance on labs done from July 2025 70 mL/min.  1.  Right wrist pain.  Tenderness along the ulna, TFCC.  Will x-ray wrist.   2.  Right ankle pain.  Soft tissue swelling, distal fibular tenderness and fifth metatarsal tenderness.  Will x-ray ankle and foot.  Reviewed imaging independently.  Right wrist: Calcifications around the ulna/TFCC, appear chronic.  Ulna appears normal.  Wrist otherwise normal. Right ankle: Lateral soft tissue swelling.  No displaced fracture Right foot: No fracture of the fifth metatarsal.  Formal radiology overread pending.  Discussed this with patient.  Will contact patient [phone] if radiology overread differs enough from mine and we need to change management.  Reviewed radiology report.  Wrist, ankle, foot  x-rays with degenerative changes, no acute fractures or dislocations.  This is consistent with my read.  See radiology report for full details.  Patient presents with a right wrist sprain, right ankle and foot sprain. wrist splint, ASO.  Continue Tylenol /ibuprofen together 3-4 times a day as needed for pain.  Ice, elevation.  Follow-up with [facility] clinic orthopedics if not better in a week to 10 days.  Discussed imaging, MDM,  treatment plan, and plan for follow-up with patient and family member.  They agree with plan.   No orders of the defined types were placed in this encounter.     *This clinic note was created using Dragon dictation software. Therefore, there may be occasional mistakes despite careful proofreading. ?    Van Knee, MD 12/30/23 0930

## 2024-01-07 ENCOUNTER — Inpatient Hospital Stay: Attending: Oncology

## 2024-01-07 DIAGNOSIS — Z823 Family history of stroke: Secondary | ICD-10-CM | POA: Insufficient documentation

## 2024-01-07 DIAGNOSIS — K648 Other hemorrhoids: Secondary | ICD-10-CM | POA: Diagnosis not present

## 2024-01-07 DIAGNOSIS — Z79899 Other long term (current) drug therapy: Secondary | ICD-10-CM | POA: Diagnosis not present

## 2024-01-07 DIAGNOSIS — Z90710 Acquired absence of both cervix and uterus: Secondary | ICD-10-CM | POA: Insufficient documentation

## 2024-01-07 DIAGNOSIS — Z8249 Family history of ischemic heart disease and other diseases of the circulatory system: Secondary | ICD-10-CM | POA: Diagnosis not present

## 2024-01-07 DIAGNOSIS — Z8719 Personal history of other diseases of the digestive system: Secondary | ICD-10-CM | POA: Diagnosis not present

## 2024-01-07 DIAGNOSIS — I10 Essential (primary) hypertension: Secondary | ICD-10-CM | POA: Insufficient documentation

## 2024-01-07 DIAGNOSIS — K921 Melena: Secondary | ICD-10-CM | POA: Diagnosis not present

## 2024-01-07 DIAGNOSIS — Z8673 Personal history of transient ischemic attack (TIA), and cerebral infarction without residual deficits: Secondary | ICD-10-CM | POA: Insufficient documentation

## 2024-01-07 DIAGNOSIS — D509 Iron deficiency anemia, unspecified: Secondary | ICD-10-CM | POA: Insufficient documentation

## 2024-01-07 DIAGNOSIS — D508 Other iron deficiency anemias: Secondary | ICD-10-CM

## 2024-01-07 DIAGNOSIS — J45909 Unspecified asthma, uncomplicated: Secondary | ICD-10-CM | POA: Diagnosis not present

## 2024-01-07 DIAGNOSIS — Z833 Family history of diabetes mellitus: Secondary | ICD-10-CM | POA: Insufficient documentation

## 2024-01-07 DIAGNOSIS — Z801 Family history of malignant neoplasm of trachea, bronchus and lung: Secondary | ICD-10-CM | POA: Insufficient documentation

## 2024-01-07 DIAGNOSIS — Z7982 Long term (current) use of aspirin: Secondary | ICD-10-CM | POA: Diagnosis not present

## 2024-01-07 DIAGNOSIS — E119 Type 2 diabetes mellitus without complications: Secondary | ICD-10-CM | POA: Insufficient documentation

## 2024-01-07 DIAGNOSIS — Z860100 Personal history of colon polyps, unspecified: Secondary | ICD-10-CM | POA: Insufficient documentation

## 2024-01-07 DIAGNOSIS — E7800 Pure hypercholesterolemia, unspecified: Secondary | ICD-10-CM | POA: Diagnosis not present

## 2024-01-07 DIAGNOSIS — K2100 Gastro-esophageal reflux disease with esophagitis, without bleeding: Secondary | ICD-10-CM | POA: Diagnosis not present

## 2024-01-07 DIAGNOSIS — Z803 Family history of malignant neoplasm of breast: Secondary | ICD-10-CM | POA: Diagnosis not present

## 2024-01-07 DIAGNOSIS — Z85820 Personal history of malignant melanoma of skin: Secondary | ICD-10-CM | POA: Diagnosis not present

## 2024-01-07 LAB — CBC WITH DIFFERENTIAL (CANCER CENTER ONLY)
Abs Immature Granulocytes: 0.01 K/uL (ref 0.00–0.07)
Basophils Absolute: 0.1 K/uL (ref 0.0–0.1)
Basophils Relative: 2 %
Eosinophils Absolute: 0.5 K/uL (ref 0.0–0.5)
Eosinophils Relative: 11 %
HCT: 41.6 % (ref 36.0–46.0)
Hemoglobin: 13.2 g/dL (ref 12.0–15.0)
Immature Granulocytes: 0 %
Lymphocytes Relative: 26 %
Lymphs Abs: 1.2 K/uL (ref 0.7–4.0)
MCH: 31.3 pg (ref 26.0–34.0)
MCHC: 31.7 g/dL (ref 30.0–36.0)
MCV: 98.6 fL (ref 80.0–100.0)
Monocytes Absolute: 0.4 K/uL (ref 0.1–1.0)
Monocytes Relative: 10 %
Neutro Abs: 2.3 K/uL (ref 1.7–7.7)
Neutrophils Relative %: 51 %
Platelet Count: 235 K/uL (ref 150–400)
RBC: 4.22 MIL/uL (ref 3.87–5.11)
RDW: 14.6 % (ref 11.5–15.5)
WBC Count: 4.5 K/uL (ref 4.0–10.5)
nRBC: 0 % (ref 0.0–0.2)

## 2024-01-07 LAB — IRON AND TIBC
Iron: 85 ug/dL (ref 28–170)
Saturation Ratios: 19 % (ref 10.4–31.8)
TIBC: 438 ug/dL (ref 250–450)
UIBC: 353 ug/dL

## 2024-01-07 LAB — FERRITIN: Ferritin: 40 ng/mL (ref 11–307)

## 2024-01-07 LAB — RETIC PANEL
Immature Retic Fract: 21.9 % — ABNORMAL HIGH (ref 2.3–15.9)
RBC.: 4.14 MIL/uL (ref 3.87–5.11)
Retic Count, Absolute: 91.9 K/uL (ref 19.0–186.0)
Retic Ct Pct: 2.2 % (ref 0.4–3.1)
Reticulocyte Hemoglobin: 34.8 pg (ref >27.9)

## 2024-01-07 LAB — VITAMIN B12: Vitamin B-12: 2091 pg/mL — ABNORMAL HIGH (ref 180–914)

## 2024-01-08 ENCOUNTER — Encounter: Payer: Self-pay | Admitting: Oncology

## 2024-01-08 ENCOUNTER — Inpatient Hospital Stay

## 2024-01-08 ENCOUNTER — Inpatient Hospital Stay (HOSPITAL_BASED_OUTPATIENT_CLINIC_OR_DEPARTMENT_OTHER): Admitting: Oncology

## 2024-01-08 VITALS — BP 145/77 | HR 84 | Temp 97.3°F | Resp 18 | Wt 205.5 lb

## 2024-01-08 DIAGNOSIS — K2970 Gastritis, unspecified, without bleeding: Secondary | ICD-10-CM

## 2024-01-08 DIAGNOSIS — D509 Iron deficiency anemia, unspecified: Secondary | ICD-10-CM | POA: Diagnosis not present

## 2024-01-08 DIAGNOSIS — D508 Other iron deficiency anemias: Secondary | ICD-10-CM | POA: Diagnosis not present

## 2024-01-08 NOTE — Assessment & Plan Note (Addendum)
 Reviewed labs with patient.   Lab Results  Component Value Date   HGB 13.2 01/07/2024   TIBC 438 01/07/2024   IRONPCTSAT 19 01/07/2024   FERRITIN 40 01/07/2024     Hemoglobin is stable no need for additional IV venofer . Continue  ferrous sulfate 325mg  every other day.

## 2024-01-08 NOTE — Assessment & Plan Note (Signed)
Cotinue PPI Advised to monitor for potential chronic blood loss and to report any changes in stool color

## 2024-01-08 NOTE — Progress Notes (Signed)
 Hematology/Oncology Consult note [phone] [phone]      Patient Care Team: Glendia Shad, MD as PCP - General (Internal Medicine) Darliss Rogue, MD as PCP - Cardiology (Cardiology) Babara Call, MD as Consulting Physician (Oncology)   REFERRING PROVIDER: Glendia Shad, MD  CHIEF COMPLAINTS/REASON FOR VISIT:  Anemia  ASSESSMENT & PLAN:  IDA (iron  deficiency anemia) Reviewed labs with patient.   Lab Results  Component Value Date   HGB 13.2 01/07/2024   TIBC 438 01/07/2024   IRONPCTSAT 19 01/07/2024   FERRITIN 40 01/07/2024     Hemoglobin is stable no need for additional IV venofer . Continue  ferrous sulfate 325mg  every other day.    Gastritis Cotinue PPI Advised to monitor for potential chronic blood loss and to report any changes in stool color  Orders Placed This Encounter  Procedures   CBC with Differential (Cancer Center Only)    Standing Status:   Future    Expected Date:   01/07/2025    Expiration Date:   04/07/2025   Iron  and TIBC    Standing Status:   Future    Expected Date:   01/07/2025    Expiration Date:   04/07/2025   Ferritin    Standing Status:   Future    Expected Date:   01/07/2025    Expiration Date:   04/07/2025   Vitamin B12    Standing Status:   Future    Expected Date:   01/07/2025    Expiration Date:   04/07/2025   Follow up in 12 months.  All questions were answered. The patient knows to call the clinic with any problems, questions or concerns.  Call Babara, MD, PhD [facility] Hematology Oncology 01/08/2024     HISTORY OF PRESENTING ILLNESS:  Angela Cohen is a  83 y.o.  female with PMH listed below who was referred to me for anemia Reviewed patient's recent labs that was done.  She was found to have abnormal CBC on 12/05/2021, hemoglobin 7.6, this dropped from 9.2  2 weeks prior.  12/05/2021, saturation 2.5, ferritin 12.5, TIBC 595 Reviewed patient's previous labs ordered by primary care physician's  office,  Anemia is new onset.  Patient has a normal hemoglobin of 13  Patient reports feeling very tired, dizziness.  Shortness of breath with exertion. She denies recent chest pain on exertion,  pre-syncopal episodes, or palpitations She had not noticed any recent bleeding such as epistaxis, hematuria.  Occasionally she sees bright red blood in stool.  She has noticed darker stool for brief period of time in the past, now normal. Her primary care provider talk to them and have advised patient to start Slow Fe with vitamin C supplementation while waiting for an appointment to establish with hematology.  Repeat CBC on 12/08/2021, hemoglobin was 7.7. Her last colonoscopy was 09/28/2014 -internal hemorrhoids 04/26/2016, EGD showed gastritis.  01/02/22 upper endoscopy showed LA Grade B reflux esophagitis with no bleeding, - 5 cm hiatal hernia with a few Cameron ulcers.- Erythematous mucosa in the antrum. Biopsy showed erosive gastritis Colonoscopy 2mm polyp  Patient developed iron  deficiency anemia, hemoglobin dropped in March 2025.  At that time she was taking over-the-counter vinegar pills which has caused stomach discomfort.  She stopped taking vinegar pills and started on oral iron  supplementation.  INTERVAL HISTORY Angela Cohen is a 83 y.o. female who has above history reviewed by me today presents for follow up visit for iron  deficiency anemia. Patient reports feeling well.  She takes  PPI for acid reflux.  She takes oral iron  supplementation daily.  No new complaints.   MEDICAL HISTORY:  Past Medical History:  Diagnosis Date   Aortic atherosclerosis (HCC)    Asthma    Bilateral occipital neuralgia    Bilateral occipital neuralgia    Cancer (HCC) 07/10/2023   melanoma   Diabetes mellitus without complication (HCC)    Dyspnea    Fibrocystic breast disease    Gastritis    EGD - schatzki's ring, gastritis and gastric polyps   GERD (gastroesophageal reflux disease)    Hiatal hernia     History of colon polyps    Hypercholesterolemia    Hypertension    IDA (iron  deficiency anemia)    MAI (mycobacterium avium-intracellulare) infection (HCC)    Meningioma (HCC)    Mixed incontinence    Osteoporosis    Pneumonia    PONV (postoperative nausea and vomiting)    Reflux esophagitis    Small vessel stroke (HCC)     SURGICAL HISTORY: Past Surgical History:  Procedure Laterality Date   ABDOMINAL HYSTERECTOMY  1998   bladder tact     BREAST EXCISIONAL BIOPSY Bilateral 1970's   benign   BREAST EXCISIONAL BIOPSY Bilateral 2010   pt  stated dr claudene did bilat axilla lumps neg    BREAST SURGERY     cyst(benign)   CATARACT EXTRACTION W/PHACO Left 06/13/2021   Procedure: CATARACT EXTRACTION PHACO AND INTRAOCULAR LENS PLACEMENT (IOC) LEFT;  Surgeon: Myrna Adine Anes, MD;  Location: [facility];  Service: Ophthalmology;  Laterality: Left;  7.04 0:40.5   CATARACT EXTRACTION W/PHACO Right 06/27/2021   Procedure: CATARACT EXTRACTION PHACO AND INTRAOCULAR LENS PLACEMENT (IOC) RIGHT;  Surgeon: Myrna Adine Anes, MD;  Location: [facility];  Service: Ophthalmology;  Laterality: Right;  3.32 0:30.7   COLONOSCOPY N/A 09/28/2014   Procedure: COLONOSCOPY;  Surgeon: Lamar ONEIDA Holmes, MD;  Location: [facility] END[facility]COPY;  Service: Endoscopy;  Laterality: N/A;   COLONOSCOPY WITH PROPOFOL  N/A 01/02/2022   Procedure: COLONOSCOPY WITH PROPOFOL ;  Surgeon: Maryruth Ole ONEIDA, MD;  Location: [facility] ENDOSCOPY;  Service: Endoscopy;  Laterality: N/A;   ESOPHAGOGASTRODUODENOSCOPY N/A 09/28/2014   Procedure: ESOPHAGOGASTRODUODENOSCOPY (EGD);  Surgeon: Lamar ONEIDA Holmes, MD;  Location: [facility] ENDOSCOPY;  Service: Endoscopy;  Laterality: N/A;   ESOPHAGOGASTRODUODENOSCOPY (EGD) WITH PROPOFOL  N/A 04/26/2016   Procedure: ESOPHAGOGASTRODUODENOSCOPY (EGD) WITH PROPOFOL ;  Surgeon: Lamar ONEIDA Holmes, MD;  Location: [facility] ENDOSCOPY;  Service: Endoscopy;  Laterality: N/A;   ESOPHAGOGASTRODUODENOSCOPY (EGD)  WITH PROPOFOL  N/A 01/02/2022   Procedure: ESOPHAGOGASTRODUODENOSCOPY (EGD) WITH PROPOFOL ;  Surgeon: Maryruth Ole ONEIDA, MD;  Location: [facility] ENDOSCOPY;  Service: Endoscopy;  Laterality: N/A;   excision of right axillary lipoma  02/2006   EYE SURGERY     FOOT SURGERY Right    FRACTURE SURGERY  2019   left shoulder repair   HEMORRHOID SURGERY     HERNIA REPAIR  02/2006   INTERSTIM IMPLANT PLACEMENT N/A 12/10/2023   Procedure: INSERTION, SACRAL NERVE STIMULATOR, INTERSTIM, STAGE 1;  Surgeon: Gaston Hamilton, MD;  Location: [facility] ORS;  Service: Urology;  Laterality: N/A;  Impedance Check   INTERSTIM IMPLANT PLACEMENT N/A 12/10/2023   Procedure: INSERTION, SACRAL NERVE STIMULATOR, INTERSTIM, STAGE 2;  Surgeon: Gaston Hamilton, MD;  Location: [facility] ORS;  Service: Urology;  Laterality: N/A;   NASAL SEPTUM SURGERY      SOCIAL HISTORY: Social History   Socioeconomic History   Marital status: Married    Spouse name: VANACORE,AL   Number of children: Not on  file   Years of education: Not on file   Highest education level: Not on file  Occupational History   Not on file  Tobacco Use   Smoking status: Never    Passive exposure: Never   Smokeless tobacco: Never   Tobacco comments:    Never  Vaping Use   Vaping status: Never Used  Substance and Sexual Activity   Alcohol use: No    Alcohol/week: 0.0 standard drinks of alcohol   Drug use: No   Sexual activity: Not on file  Other Topics Concern   Not on file  Social History Narrative   Lives husband   Social Drivers of Corporate investment banker Strain: Not on file  Food Insecurity: Not on file  Transportation Needs: Not on file  Physical Activity: Not on file  Stress: Not on file  Social Connections: Not on file  Intimate Partner Violence: Not on file    FAMILY HISTORY: Family History  Problem Relation Age of Onset   Heart disease Mother 91       heart attack   Cancer Father        lung   Diabetes Sister    Breast  cancer Sister 34   Breast cancer Daughter 4   Stroke Brother    Diabetes Brother     ALLERGIES:  is allergic to myrbetriq [mirabegron] and tape.  MEDICATIONS:  Current Outpatient Medications  Medication Sig Dispense Refill   acetaminophen  (TYLENOL ) 500 MG tablet Take 1,000 mg by mouth daily.     aspirin 81 MG EC tablet Take 81 mg by mouth at bedtime. Swallow whole.     baclofen  (LIORESAL ) 10 MG tablet Take 10 mg by mouth 2 (two) times daily.     Calcium  Carb-Cholecalciferol (CALCIUM +D3 PO) Take 2 tablets by mouth at bedtime.     Collagen-Vitamin C-Biotin (COLLAGEN PO) Take 2 tablets by mouth at bedtime.     diphenhydrAMINE (BENADRYL) 25 MG tablet Take 50 mg by mouth at bedtime.     ELDERBERRY PO Take 50 mg by mouth at bedtime.     ferrous sulfate 325 (65 FE) MG tablet Take 325 mg by mouth every Sunday.     Fluticasone -Umeclidin-Vilant (TRELEGY ELLIPTA ) 100-62.5-25 MCG/ACT AEPB Inhale 1 puff into the lungs daily. (Patient taking differently: Inhale 1 puff into the lungs daily as needed (shortness of breath).) 180 each 3   HYDROcodone -acetaminophen  (NORCO/VICODIN) 5-325 MG tablet Take 1-2 tablets by mouth every 6 (six) hours as needed for moderate pain (pain score 4-6). 14 tablet 0   ibuprofen (ADVIL) 200 MG tablet Take 400 mg by mouth every evening.     Magnesium  400 MG TABS Take 400 mg by mouth at bedtime.     Melatonin 10 MG TABS Take 20 mg by mouth at bedtime.     metoprolol  succinate (TOPROL -XL) 25 MG 24 hr tablet Take 1 tablet (25 mg total) by mouth 2 (two) times daily. 180 tablet 1   montelukast  (SINGULAIR ) 10 MG tablet Take 1 tablet (10 mg total) by mouth at bedtime. 90 tablet 3   Multiple Vitamin (MULTIVITAMIN WITH MINERALS) TABS Take 2 tablets by mouth at bedtime.     Omega-3 Fatty Acids (FISH OIL) 1200 MG CAPS Take 2,400 mg by mouth daily.     ondansetron  (ZOFRAN ) 4 MG tablet Take 1 tablet (4 mg total) by mouth 2 (two) times daily as needed for nausea or vomiting. 30 tablet 0    pantoprazole  (PROTONIX ) 40 MG tablet Take  1 tablet (40 mg total) by mouth 2 (two) times daily before a meal. 180 tablet 1   Potassium 99 MG TABS Take 99 mg by mouth at bedtime.     pregabalin (LYRICA) 50 MG capsule Take 50 mg by mouth 2 (two) times daily.     rosuvastatin  (CRESTOR ) 10 MG tablet TAKE 1 TABLET DAILY (Patient taking differently: Take 10 mg by mouth at bedtime.) 90 tablet 3   vitamin B-12 (CYANOCOBALAMIN ) 500 MCG tablet Take 1,000 mcg by mouth at bedtime.     Current Facility-Administered Medications  Medication Dose Route Frequency Provider Last Rate Last Admin   denosumab  (PROLIA ) injection 60 mg  60 mg Subcutaneous Q6 months Glendia Shad, MD   60 mg at 12/24/23 1403   [START ON 06/25/2024] denosumab  (PROLIA ) injection 60 mg  60 mg Subcutaneous Q6 months Glendia Shad, MD        Review of Systems  Constitutional:  Negative for appetite change, chills, fatigue and fever.  HENT:   Negative for hearing loss and voice change.   Eyes:  Negative for eye problems.  Respiratory:  Negative for chest tightness and cough.   Cardiovascular:  Negative for chest pain.  Gastrointestinal:  Negative for abdominal distention, abdominal pain and blood in stool.  Endocrine: Negative for hot flashes.  Genitourinary:  Negative for difficulty urinating and frequency.   Musculoskeletal:  Negative for arthralgias.  Skin:  Negative for itching and rash.  Neurological:  Negative for extremity weakness.  Hematological:  Negative for adenopathy.  Psychiatric/Behavioral:  Negative for confusion.     PHYSICAL EXAMINATION: ECOG PERFORMANCE STATUS: 1 - Symptomatic but completely ambulatory Vitals:   01/08/24 1342 01/08/24 1349  BP: (!) 162/78 (!) 145/77  Pulse: 84   Resp: 18   Temp: (!) 97.3 F (36.3 C)   SpO2: 95%    Filed Weights   01/08/24 1342  Weight: 205 lb 8 oz (93.2 kg)    Physical Exam Constitutional:      General: She is not in acute distress. HENT:     Head: Normocephalic  and atraumatic.  Eyes:     General: No scleral icterus. Cardiovascular:     Rate and Rhythm: Normal rate and regular rhythm.     Heart sounds: Normal heart sounds.  Pulmonary:     Effort: Pulmonary effort is normal. No respiratory distress.     Breath sounds: No wheezing.  Abdominal:     General: Bowel sounds are normal. There is no distension.     Palpations: Abdomen is soft.  Musculoskeletal:        General: No deformity. Normal range of motion.     Cervical back: Normal range of motion and neck supple.  Skin:    General: Skin is warm and dry.     Coloration: Skin is not pale.     Findings: No erythema or rash.  Neurological:     Mental Status: She is alert and oriented to person, place, and time. Mental status is at baseline.     Cranial Nerves: No cranial nerve deficit.     Coordination: Coordination normal.  Psychiatric:        Mood and Affect: Mood normal.      LABORATORY DATA:  I have reviewed the data as listed    Latest Ref Rng & Units 01/07/2024   10:56 AM 12/03/2023   11:17 AM 09/03/2023   11:04 AM  CBC  WBC 4.0 - 10.5 K/uL 4.5  4.6  4.0  Hemoglobin 12.0 - 15.0 g/dL 86.7  89.0  88.1   Hematocrit 36.0 - 46.0 % 41.6  33.5  38.0   Platelets 150 - 400 K/uL 235  238.0  258       Latest Ref Rng & Units 12/03/2023   11:17 AM 07/31/2023    8:35 AM 05/21/2023   10:38 AM  CMP  Glucose 70 - 99 mg/dL 864  874    BUN 6 - 23 mg/dL 16  13    Creatinine 9.59 - 1.20 mg/dL 9.09  9.13    Sodium 864 - 145 mEq/L 139  141    Potassium 3.5 - 5.1 mEq/L 4.7  3.9    Chloride 96 - 112 mEq/L 102  106    CO2 19 - 32 mEq/L 27  25    Calcium  8.4 - 10.5 mg/dL 9.3  9.1    Total Protein 6.0 - 8.3 g/dL 7.1  7.1  7.3   Total Bilirubin 0.2 - 1.2 mg/dL 0.3  0.3  0.4   Alkaline Phos 39 - 117 U/L 85  70  85   AST 0 - 37 U/L 26  24  31    ALT 0 - 35 U/L 26  25  42       Component Value Date/Time   IRON  85 01/07/2024 1056   TIBC 438 01/07/2024 1056   FERRITIN 40 01/07/2024 1056    IRONPCTSAT 19 01/07/2024 1056     RADIOGRAPHIC STUDIES: I have personally reviewed the radiological images as listed and agreed with the findings in the report. DG Foot Complete Right Result Date: 12/30/2023 CLINICAL DATA:  Pain after a fall. EXAM: RIGHT FOOT COMPLETE - 3+ VIEW COMPARISON:  None Available. FINDINGS: No evidence for an acute fracture. No dislocation. Degenerative changes are seen in the MTP joint great toe. Degenerative changes also noted MTP joint of the middle toe in scattered IP joints. No worrisome lytic or sclerotic osseous abnormality. IMPRESSION: Degenerative changes without acute bony findings. Electronically Signed   By: Camellia Candle M.D.   On: 12/30/2023 09:16   DG Ankle Complete Right Result Date: 12/30/2023 CLINICAL DATA:  Pain after a fall. EXAM: RIGHT ANKLE - COMPLETE 3+ VIEW COMPARISON:  None Available. FINDINGS: There is no evidence of fracture, dislocation, or joint effusion. There is no evidence of arthropathy or other focal bone abnormality. Soft tissues are unremarkable. IMPRESSION: Negative. Electronically Signed   By: Camellia Candle M.D.   On: 12/30/2023 09:15   DG Wrist Complete Right Result Date: 12/30/2023 CLINICAL DATA:  Status post fall.  Pain. EXAM: RIGHT WRIST - COMPLETE 3+ VIEW COMPARISON:  None Available. FINDINGS: No evidence for an acute fracture. No subluxation or dislocation. Degenerative changes are seen at the first carpometacarpal joint and in the radial carpus. Mineralization of the TFCC evident. IMPRESSION: Degenerative changes without acute bony findings. Electronically Signed   By: Camellia Candle M.D.   On: 12/30/2023 09:15   DG C-Arm 1-60 Min-No Report Result Date: 12/10/2023 Fluoroscopy was utilized by the requesting physician.  No radiographic interpretation.

## 2024-01-19 ENCOUNTER — Other Ambulatory Visit: Payer: Self-pay | Admitting: Internal Medicine

## 2024-01-30 ENCOUNTER — Other Ambulatory Visit: Payer: Self-pay | Admitting: Internal Medicine

## 2024-02-20 ENCOUNTER — Ambulatory Visit: Admitting: Physician Assistant

## 2024-03-03 ENCOUNTER — Ambulatory Visit (INDEPENDENT_AMBULATORY_CARE_PROVIDER_SITE_OTHER): Admitting: Physician Assistant

## 2024-03-03 VITALS — BP 144/75 | HR 74

## 2024-03-03 DIAGNOSIS — N3946 Mixed incontinence: Secondary | ICD-10-CM | POA: Diagnosis not present

## 2024-03-03 NOTE — Progress Notes (Signed)
 Patient presents to clinic today for InterStim management. She reports her urinary symptoms have improved since InterStim placement, however her nighttime leakage has progressively worsened recently.  Her absorbent products will be saturated.  She think she had better overall symptomatic control with the temporary lead in place..  Setting on arrival: P3, 2.2  We discussed how to change her device settings and proceed sequentially from P1-P7 in 2 weeks tests until she arrives at a program and setting that works well for her.  At the conclusion of today's visit, her device was set to P1, setting 1.8.

## 2024-03-03 NOTE — Patient Instructions (Signed)
 Your Interstim Components  Your device has three components: The implant, which you can feel in your buttock The communicator, which is the white and blue flat credit card-sized device you place over your implant when you want to adjust its settings The remote, which is the [facility] cell phone device where you adjust your settings  Think of your implant as a television, the remote as your remote control where you change the settings and turn it on and off, and the communicator as the receiver the TV needs to get instructions from the remote control. Once you turn the TV on, it's going to stay on until you turn it off. If the batteries in the remote or the communicator die, the TV will keep running. That means that your device will keep working if either of the two other components are powered off, run out of battery, or are not physically with you. In fact, you only need the communicator and remote when you are adjusting your settings (or coming to clinic to get help)--otherwise you can leave them at home!  How to Adjust Your Interstim Settings  Today we set your device to Program 1, Intensity 1.8.  Your device has 7 different programs, and you may need to try a few of them to achieve maximum results. Once you find a program that is working well for you, you may stay on it.  It takes time after changing the program to see results. Every time you change the program, you should plan to stay on that program for 2 full weeks before moving on to the next one. After the first week, if your symptoms are no better, then you may increase the intensity--but keep the program the same. If after 2 weeks on the same program you still notice no difference, then you may switch to a different program and repeat the process.

## 2024-03-13 ENCOUNTER — Telehealth: Payer: Self-pay | Admitting: Oncology

## 2024-03-13 NOTE — Telephone Encounter (Signed)
 Pt called and said she wanted to cancel her appts for lab, MD follow up, and infusion for 01/05/25 and 01/06/25. Said she has a lot going on in life right now and doesn't wish to r/s at this time. Messaged the clinical team to make them aware - [facility]

## 2024-03-24 ENCOUNTER — Other Ambulatory Visit: Payer: Self-pay | Admitting: Internal Medicine

## 2024-04-07 ENCOUNTER — Ambulatory Visit: Admitting: Internal Medicine

## 2024-04-25 ENCOUNTER — Other Ambulatory Visit: Payer: Self-pay | Admitting: Internal Medicine

## 2024-04-25 DIAGNOSIS — Z1231 Encounter for screening mammogram for malignant neoplasm of breast: Secondary | ICD-10-CM

## 2024-04-27 NOTE — Progress Notes (Signed)
 [facility] CUT[facility]OUS ONCOLOGY CLINIC NOTE  Date:04/28/2024  DIAGNOSIS: Squamous Cell Carcinoma and poorly differentiated carcinoma of unknown primary with concern for metastatic disease STAGE: N/A PRIOR TREATMENT:  1) resection in 2013  CURRENT TREATMENT: Cemiplimab 350mg  q 3 weeks  ONCOLOGIC HISTORY: - 02/19/2024: Left nasal dorsum Mohs surgery and right nasal punch biopsy with results as below - 04/01/2024: PET scan showing no evidence of metastatic disease   TODAY'S VISIT: History of Present Illness The patient presents for further evaluation of her squamous cell carcinoma of the nose.  She has removed the bandage from her nose and is applying Vaseline to the area. She reports a slight increase in the size of the lesion, although it remains smaller than its original size prior to partial removal. Her physician informed her that the majority of the cancer was excised during the biopsy, but due to the risk of spread, he recommended chemotherapy before complete removal. The diagnosis was confirmed as squamous cell carcinoma, not melanoma or basal cell carcinoma.  She expresses concern about the potential side effects of chemotherapy on her body. The physician reassured her that she is unlikely to experience significant side effects such as hair loss, nausea, or diarrhea. However, she may feel some fatigue a few days after the infusion. There is a less than 3% chance of more serious side effects, including skin rash, diarrhea, shortness of breath, or liver issues, which would require immediate medical attention.  She has rheumatoid arthritis, which she manages with fish oil supplements. She notes that if she misses a few doses of fish oil, she experiences cramping in her fingers. She recalls a previous illness where she received two injections, one of which was prednisone.  FAMILY HISTORY Her mother had rheumatoid arthritis, and all 10 siblings have  it.    PAST MEDICAL HISTORY: No past medical history on file.  PAST SURGICAL HISTORY: No past surgical history on file.  MEDICATIONS: Current Medications[1]     ALLERGIES: Allergies[2]  FAMILY HISTORY: No family history on file.  SOCIAL HISTORY: Social History[3]  REVIEW OF SYSTEMS: CONSTITUTIONAL: Denies lack of appetite, weight loss, fevers, or chills HEENT: No blurred vision. No problems with hearing. No mouth sores or sore throat.   RESPIRATORY: Denies cough, shortness of breath, or hemoptysis CARDIOVASCULAR: Denies chest pain, orthopnea, or palpitations. GASTROINTESTINAL: No nausea, vomiting, abdominal pain, diarrhea or constipation. GENITOURINARY: No dysuria, hematuria, frequency or hesitency. MUSCULOSKELETAL: No painful joints, back pain. Denies any pain or tingling in the legs, hands or feet. ENDOCRINE: Denies any heat or cold intolerance, No tremors NEURO: Denies insomnia, headaches, dizziness. SKIN: Denies any dry skin, rash, or change in skin tone. HEME: Denies bruising, petechiae, or any other bleeding complications. LYMPH: Denies any lymphadenopathy. No new bumps appreciated.   PHYSICAL EXAMINATION: VITAL SIGNS: Vitals:   04/28/24 0821  BP: 130/72  Pulse: 57  Temp: 97.9 F (36.6 C)  SpO2: 95%  Weight: 208 lb 6.4 oz (94.5 kg)  Height: 5' 4 (1.626 m)     Wt Readings from Last 3 Encounters:  04/28/24 208 lb 6.4 oz (94.5 kg)  03/25/24 208 lb 2 oz (94.4 kg)    ECOG performance status: 1 - Symptomatic but completely ambulatory   GENERAL: No apparent distress. AAOx3 SKIN: No lesions, or rash. HEENT: Anicteric sclera. No oral ulcers or bleeding. NECK: No pain on palpation or mobilization of the neck. CHEST: No tenderness on chest wall or spine. RESPIRATORY: Clear to auscultation bilaterally. Normal work of  breathing. CARDIOVASCULAR: RRR, no m/r/g. No peripheral edema noted. ABDOMEN: Symmetric. Nontender to palpation. No hepatosplenomegaly  noted EXTREMITIES: No cyanosis, clubbing or edema. Normal ROM. LYMPH: No cervical, axillary, or supraclavicular adenopathy NEUROLOGIC: Nonfocal. Alert and oriented x 3. Normal sensation and strength. MUSCULOSKELETAL: Normal ROM noted. Strength and tone are maintained and symmetrical.  LABS: Lab Results  Component Value Date   WBC 4.2 04/28/2024   HGB 12.9 04/28/2024   HCT 40.6 04/28/2024   MCV 103.8 (H) 04/28/2024   Plt Ct 240 04/28/2024   Lab Results  Component Value Date   Na 143 04/28/2024   Potassium 4.2 04/28/2024   Cl 105 04/28/2024   CO2 27 04/28/2024   BUN 15 04/28/2024   Creatinine 0.85 04/28/2024   Ca 8.9 04/28/2024   Alb 4.2 04/28/2024   T Bili 0.4 04/28/2024   ALK PHOS 79 04/28/2024   AST 32 04/28/2024   ALT 37 (H) 04/28/2024      RADIOLOGY: NO imaging to review  All studies were reviewed personally by me in the clinic.  PATHOLOGY: Reviewed outside pathology  ASSESSMENT AND PLAN: Angela Cohen is a 83 y.o. female presenting with Squamous cell carcinoma for medical oncology evaluation.  Squamous Cell Carcinoma with in-transit mets Plan with Dr. Karis is to proceed with 4 cycles of cemiplimab and then proceed with completion resection.  Will plan to proceed with C1 today.    - Proceed with C1 of cemiplimab today - Continue follow-up with Dr. Karis  2. Reported History of RA However, I do not see any evidence of active biologics. She takes fish oil.  Will need to monitor carefully.  Can trial prednisone 10mg  daily if notes issues on cemiplimab.    RTC: 3 weeks labs, visit, and treatment with cemiplimab  Documentation for time-based billing:  Total time spent of date of service was 40 minutes.  Patient care activities included preparing to see the patient such as reviewing the patient record, obtaining and/or reviewing separately obtained history, performing a medically appropriate history and physical examination, counseling and educating the  patient, family, and/or caregiver, ordering prescription medications, tests, or procedures, referring and communicating with other health care providers when not separately reported during the visit, documenting clinical information in the electronic or other health record, independently interpreting results when not separately reported, communicating results to the patient/family/caregiver, and coordinating the care of the patient when not separately reported.          [1]  Current Outpatient Medications:    acetaminophen  (TYLENOL ) 500 mg tablet, Take two tablets (1,000 mg dose) by mouth daily., Disp: , Rfl:    aspirin (ECOTRIN LOW DOSE) EC tablet, Take one tablet (81 mg dose) by mouth at bedtime. SWALLOW WHOLE, Disp: , Rfl:    baclofen  (LIORESAL ) 10 mg tablet, Take one tablet (10 mg dose) by mouth 2 (two) times a day as needed., Disp: , Rfl:    cholecalciferol (VITAMIN D -1000 MAX ST) 1,000 units (25 mcg) tablet, Take one tablet (1,000 Units dose) by mouth daily., Disp: , Rfl:    Collagen-Vitamin C-Biotin 500-50-0.8 MG CAPS, Take 2 tablets by mouth at bedtime., Disp: , Rfl:    diphenhydrAMINE (BANOPHEN) 25 mg tablet, Take two tablets (50 mg dose) by mouth at bedtime., Disp: , Rfl:    ELDERBERRY PO, Take 50 mg by mouth at bedtime., Disp: , Rfl:    ferrous sulfate 325 (65 FE) MG tablet, Take one tablet (325 mg dose) by mouth., Disp: , Rfl:  ibuprofen (ADVIL,MOTRIN) 200 mg tablet, Take two tablets (400 mg dose) by mouth every evening., Disp: , Rfl:    magnesium  oxide (MAGNESIUM ) 400 TABS, Take one tablet (400 mg dose) by mouth at bedtime., Disp: , Rfl:    metoprolol  succinate (TOPROL -XL) 25 mg 24 hr tablet, Take one tablet (25 mg dose) by mouth 2 (two) times daily., Disp: , Rfl:    montelukast  (SINGULAIR ) 10 MG tablet, Take one tablet (10 mg dose) by mouth at bedtime., Disp: , Rfl:    Multiple Vitamins-Minerals (SUPER THERA VITE M) TABS, Take by mouth., Disp: , Rfl:    Omega-3  Fatty Acids (FISH OIL) 1200 mg capsule, Take two capsules (2,400 mg dose) by mouth daily., Disp: , Rfl:    pantoprazole  sodium (PROTONIX ) 40 mg tablet, Take one tablet (40 mg dose) by mouth 2 (two) times daily., Disp: , Rfl:    POTASSIUM PO, Take 99 mg by mouth., Disp: , Rfl:    pregabalin (LYRICA) 50 mg capsule, Take one capsule (50 mg dose) by mouth 2 (two) times daily., Disp: , Rfl:    rosuvastatin  calcium  (CRESTOR ) 10 mg tablet, Take one tablet (10 mg dose) by mouth daily., Disp: , Rfl:    TART CHERRY PO, Take by mouth., Disp: , Rfl:    vitamin B-12 (CYANOCOBALAMIN ) 1000 mcg tablet, Take one tablet (1,000 mcg dose) by mouth daily., Disp: , Rfl:  [2] Allergies Allergen Reactions   Silicone Itching and Rash   Latex Rash  [3] Social History Socioeconomic History   Marital status: Married  Tobacco Use   Smoking status: Never    Passive exposure: Current   Smokeless tobacco: Never  Vaping Use   Vaping status: Never Used  Substance and Sexual Activity   Alcohol use: Never   Drug use: Never

## 2024-04-28 NOTE — Progress Notes (Signed)
 Adult Chemotherapy Infusion Note Cemiplimab-rwlc 350 mg Cycle1, Day1   Oncology Assessments  ECOG Performance Status: 1 (04/28/2024  8:54 AM)   The distress assessment was completed and the patient did not report a distress score of greater than or equal to 6.  Concerns were reported to physician/APP as appropriate.    Chemotherapy Consent Verification  The Chemotherapy Consent form signature has been verified.   Infusion Assessment  Blood return was able to be verified from peripheral IV.   The patients name, date of birth, medication, dose, rate, route, expiration date/time, appearance and physical integrity was verified by a second [facility].  Refer to [facility] for chemotherapy administration times.  Patient tolerated infusion without complications.  Blood return verified after infusion completed after infusion completed.   IV Flushed  IV line flushed per protocol after infusion completed.   Post-Infusion Instructions  Patient was instructed to notify medical team if they experience any pain, infusion site tenderness, nausea, vomiting, shortness of breath, itching, dizziness, generalized pain, flushing, swelling of lips, or swelling of tongue.

## 2024-04-28 NOTE — Progress Notes (Signed)
 [facility]  Chemotherapy/Immunotherapy/Oral Oncolytic Education Note   Date of service: 04/28/2024   Current Therapy:  cemiplimab   CC: Patient education   HPI:  Angela Cohen is a 83 y.o. year old female with Squamous Cell Carcinoma who presents today for patient education. She is accompanied by her husband, Angela Cohen. Treatment is scheduled to begin on 04/28/24.    ROS:   ROS was not completed; visit is for educational purposes only.   PHYSICAL EXAM: Physical exam was not performed; visit is for educational purposes only.  LEARNING NEEDS ASSESSMENT: A learning needs assessment was completed. The patient learns best by verbal and written  IMPRESSION/PLAN:   Today we discussed the treatment plan and reason she is receiving treatment.    Angela Cohen was given the standard [facility], as well as clinic specific information.   The patient received information sheets about the previously mentioned agents. We discussed potential side effects, including but not limited to allergic reaction, fatigue, diarrhea, myalgias, rash, thyroiditis, pneumonitis, hepatitis, adrenitis, nephritis, colitis, and damage to the heart/lungs/kidneys/liver/bone marrow which could lead to death.    Angela Cohen is agreeable with beginning treatment as planned. All of her questions were answered to her satisfaction.  Reviewed who to contact if patient needs to cancel or reschedule appointments if not using MyChart. Phone number provided.   FOLLOW UP:  Angela Cohen will return as scheduled on 04/28/24 to initiate chemotherapy.      Angela Cohen, PharmD 04/28/2024 9:38 AM [facility]

## 2024-04-28 NOTE — Progress Notes (Signed)
° °  IMMUNOTHERAPY EDUCATION  Individualized Chemotherapy Patient Education completed with patient. Patient learns best by verbal and written information. Reviewed written teaching sheets on Libtayo [cemiplimab-rwlc 350 mg IV on Day 1 every 21 Days]  as well as nausea/vomiting, constipation/diarrhea, altered lab values, Large Organ System inflammation, fatigue, nutritional management  and these were given directly to the patient. Reviewed medications, schedule, side effects, sexuality, self-care and symptom management, safe handling of medication and body fluids, emergency contact information, follow-up care, and support/community support resources. All questions answered to patients satisfaction.  Patient verbalized an understanding of the treatment plan, adherence to the treatment plan, and that the patient is to contact the [facility] with any questions or concerns.    Patient verbalized good understanding on the directions of use and adherence for the home medications. Consent obtained by [facility] pharmacist.

## 2024-05-01 ENCOUNTER — Emergency Department

## 2024-05-01 ENCOUNTER — Emergency Department
Admission: EM | Admit: 2024-05-01 | Discharge: 2024-05-01 | Disposition: A | Discharge status: Home / Self Care | Source: Ambulatory Visit | Attending: Emergency Medicine | Admitting: Emergency Medicine

## 2024-05-01 ENCOUNTER — Other Ambulatory Visit: Payer: Self-pay

## 2024-05-01 DIAGNOSIS — R519 Headache, unspecified: Secondary | ICD-10-CM | POA: Insufficient documentation

## 2024-05-01 DIAGNOSIS — H53123 Transient visual loss, bilateral: Secondary | ICD-10-CM | POA: Diagnosis present

## 2024-05-01 LAB — BASIC METABOLIC PANEL WITH GFR
Anion gap: 8 (ref 5–15)
BUN: 13 mg/dL (ref 8–23)
CO2: 27 mmol/L (ref 22–32)
Calcium: 8.7 mg/dL — ABNORMAL LOW (ref 8.9–10.3)
Chloride: 106 mmol/L (ref 98–111)
Creatinine, Ser: 0.76 mg/dL (ref 0.44–1.00)
GFR, Estimated: >60 mL/min (ref >60)
Glucose, Bld: 89 mg/dL (ref 70–99)
Potassium: 4.7 mmol/L (ref 3.5–5.1)
Sodium: 142 mmol/L (ref 135–145)

## 2024-05-01 LAB — CBC
HCT: 39.2 % (ref 36.0–46.0)
Hemoglobin: 12.4 g/dL (ref 12.0–15.0)
MCH: 32.5 pg (ref 26.0–34.0)
MCHC: 31.6 g/dL (ref 30.0–36.0)
MCV: 102.6 fL — ABNORMAL HIGH (ref 80.0–100.0)
Platelets: 248 K/uL (ref 150–400)
RBC: 3.82 MIL/uL — ABNORMAL LOW (ref 3.87–5.11)
RDW: 13.1 % (ref 11.5–15.5)
WBC: 4.7 K/uL (ref 4.0–10.5)
nRBC: 0 % (ref 0.0–0.2)

## 2024-05-01 MED ADMIN — Gadobutrol Inj 1 MMOL/ML (604.72 MG/ML): 9 mL | INTRAVENOUS | @ 13:00:00 | NDC 50419032512

## 2024-05-01 NOTE — Discharge Instructions (Signed)
 Follow-up with your primary care provider and/or your neurologist.  Continue to take your normal decongestants and other medications.  Return to the ER for new, worsening, or persistent severe headache, dizziness, vision changes, weakness, or any other new or worsening symptoms that concern you.

## 2024-05-01 NOTE — ED Triage Notes (Signed)
 Pt to ED for headache x2 weeks. Also reports shakiness in bilateral arms for a few weeks. Has been taking meds for h/a with relief.

## 2024-05-01 NOTE — ED Provider Notes (Signed)
 [facility] Provider Note    Event Date/Time   First MD Initiated Contact with Patient 05/01/24 [phone]     (approximate)   History   Headache   HPI  Angela Cohen is a 83 y.o. female with history of squamous cell carcinoma of her nose currently on chemo who presents with a headache for approximate last 2 weeks.  The patient states that she has had intermittent headaches, occurring daily, severe intensity, described as a tightness.  She feels like there is a belt wrapped around her head and pulled tight.  She reports an associated episode in which she lost her vision for approximately 30 minutes.  This occurred several days ago while she was preparing to get chemotherapy.  She states that she has had this happen several times before.  She denies any changes with her vision now.  The patient also reports some tremors in her arms when she lifts them.  She denies any weakness or numbness in her arms or legs.  She has no difficulty with speech or balance.  She denies any trauma to her head.  I reviewed the past medical records.  The patient's most recent chemo treatment was on 12/15   Physical Exam   Triage Vital Signs: ED Triage Vitals  Encounter Vitals Group     BP 05/01/24 0911 (!) 173/66     Girls Systolic BP Percentile --      Girls Diastolic BP Percentile --      Boys Systolic BP Percentile --      Boys Diastolic BP Percentile --      Pulse Rate 05/01/24 0911 80     Resp 05/01/24 0910 20     Temp 05/01/24 0910 97.9 F (36.6 C)     Temp src --      SpO2 05/01/24 0911 95 %     Weight 05/01/24 0911 203 lb (92.1 kg)     Height 05/01/24 0911 5' 4 (1.626 m)     Head Circumference --      Peak Flow --      Pain Score 05/01/24 0911 9     Pain Loc --      Pain Education --      Exclude from Growth Chart --     Most recent vital signs: Vitals:   05/01/24 1041 05/01/24 1358  BP:    Pulse:    Resp:    Temp:  97.8 F (36.6 C)  SpO2: 96%       General: Awake, no distress.  CV:  Good peripheral perfusion.  Resp:  Normal effort.  Abd:  No distention.  Other:  EOMI.  PERRLA.  No photophobia.  No facial droop.  Normal speech.  Motor and sensory intact in all extremities.  Normal gait.  No ataxia on finger-to-nose.  No pronator drift.   ED Results / Procedures / Treatments   Labs (all labs ordered are listed, but only abnormal results are displayed) Labs Reviewed  CBC - Abnormal; Notable for the following components:      Result Value   RBC 3.82 (*)    MCV 102.6 (*)    All other components within normal limits  BASIC METABOLIC PANEL WITH GFR - Abnormal; Notable for the following components:   Calcium  8.7 (*)    All other components within normal limits     EKG     RADIOLOGY  CT head: I independently viewed and interpreted the images; there is  no ICH.  Radiology report indicates no acute abnormality  MR/MRV head:  IMPRESSION:  1. No dural venous sinus thrombosis.  2. Chronic eft transverse sinus narrowing.     PROCEDURES:  Critical Care performed: No  Procedures   MEDICATIONS ORDERED IN ED: Medications  gadobutrol  (GADAVIST ) 1 MMOL/ML injection 9 mL (9 mLs Intravenous Contrast Given 05/01/24 1245)     IMPRESSION / MDM / ASSESSMENT AND PLAN / ED COURSE  I reviewed the triage vital signs and the nursing notes.  84 year old female with PMH as noted above presents with persistent headaches for the last 2 weeks.  On exam she is overall well-appearing.  Vital signs are normal except for hypertension.  Thorough neurologic exam is nonfocal.  Differential diagnosis includes, but is not limited to, tension headache, cluster headache, migraine, subdural neuralgia, other headache syndrome, less likely metastatic disease.  There is no evidence of temporal arteritis.  There is no clinical evidence of meningitis.  The patient has been treated for occipital neuralgia with a simple nerve block which she states  helped somewhat.  The patient has no neurodeficits or any evidence of CVA or TIA.  There is no evidence of intracranial hemorrhage.  The vision loss that the patient reports occurred several days ago, she states that she has had similar episodes in the past do not seem to be directly related to the headache.  We will obtain basic labs, CT head, and reassess.  Patient's presentation is most consistent with acute complicated illness / injury requiring diagnostic workup.  ----------------------------------------- 2:46 PM on 05/01/2024 -----------------------------------------  BMP and CBC show no acute findings.  CT head is negative.  The patient is very concerned about the possibility of blood clots.  Although cavernous sinus thrombosis is less common in her age, given her cancer history this is certainly on the differential for the type and time course of headache she is having.  Therefore MRI/MRV of the head was indicated.  This study is negative.  At this time, the patient is stable for discharge home.  She is not having active headache at this time.  She feels comfortable and would like to go home.  I counseled her on the results of the workup and answered all of her questions.  I gave strict return precautions, and she expressed understanding.  FINAL CLINICAL IMPRESSION(S) / ED DIAGNOSES   Final diagnoses:  Acute nonintractable headache, unspecified headache type     Rx / DC Orders   ED Discharge Orders     None        Note:  This document was prepared using Dragon voice recognition software and may include unintentional dictation errors.    Jacolyn Pae, MD 05/01/24 [phone]

## 2024-05-27 ENCOUNTER — Telehealth: Payer: Self-pay

## 2024-05-27 NOTE — Telephone Encounter (Signed)
 Prolia  VOB initiated via [email or web address]  Next Prolia  inj DUE: 06/25/24

## 2024-05-28 ENCOUNTER — Other Ambulatory Visit (HOSPITAL_COMMUNITY): Payer: Self-pay

## 2024-05-28 NOTE — Telephone Encounter (Signed)
 Pt ready for scheduling for PROLIA  on or after : 06/25/24  Option# 1: Buy/Bill (Office supplied medication)  Out-of-pocket cost due at time of clinic visit: $283  Number of injection/visits approved: ---  Primary: MEDICARE Prolia  co-insurance: 0% Admin fee co-insurance: 0%  Secondary: UHC-MEDSUP Prolia  co-insurance: covers the Medicare Part B coinsurance but does not cover the Medicare Part B deductible Admin fee co-insurance:   Medical Benefit Details: Date Benefits were checked: 05/28/24 Deductible: $0 Met of $283 Required/ Coinsurance: 0%/ Admin Fee: 0%  Prior Auth: N/A PA# Expiration Date:   # of doses approved: ----------------------------------------------------------------------- Option# 2- Med Obtained from pharmacy:  Pharmacy benefit: Copay $1,170.97 (Paid to pharmacy) Admin Fee: 0% (Pay at clinic)  Prior Auth: N/A PA# Expiration Date:   # of doses approved:   If patient wants fill through the pharmacy benefit please send prescription to: [facility], and include estimated need by date in rx notes. Pharmacy will ship medication directly to the office.  Patient NOT eligible for Prolia  Copay Card. Copay Card can make patient's cost as little as $25. Link to apply: [email or web address]://[email or web address]  ** This summary of benefits is an estimation of the patient's out-of-pocket cost. Exact cost may very based on individual plan coverage.

## 2024-05-28 NOTE — Telephone Encounter (Signed)
 SABRA

## 2024-05-29 ENCOUNTER — Telehealth: Payer: Self-pay

## 2024-05-29 NOTE — Telephone Encounter (Signed)
 Copied from CRM #[id number]. Topic: Clinical - Medication Question >> May 29, 2024  2:43 PM Avram MATSU wrote: Reason for CRM: patient would like to know if she can put off her shot until end of march, stated she does not want to mess up her chemo. Please advise [phone]

## 2024-05-30 NOTE — Telephone Encounter (Signed)
"  Pt notified. Appt changed   "

## 2024-05-30 NOTE — Telephone Encounter (Signed)
 If she desires to hold prolia  for one month - ok. She can talk with oncology as well. This should not mess up her chemo.

## 2024-06-02 ENCOUNTER — Ambulatory Visit
Admission: RE | Admit: 2024-06-02 | Discharge: 2024-06-02 | Disposition: A | Source: Ambulatory Visit | Attending: Internal Medicine | Admitting: Internal Medicine

## 2024-06-02 DIAGNOSIS — Z1231 Encounter for screening mammogram for malignant neoplasm of breast: Secondary | ICD-10-CM | POA: Diagnosis present

## 2024-06-30 ENCOUNTER — Ambulatory Visit

## 2024-07-01 ENCOUNTER — Ambulatory Visit

## 2024-08-05 ENCOUNTER — Ambulatory Visit

## 2024-09-01 ENCOUNTER — Ambulatory Visit: Admitting: Urology

## 2025-01-05 ENCOUNTER — Ambulatory Visit

## 2025-01-05 ENCOUNTER — Other Ambulatory Visit

## 2025-01-06 ENCOUNTER — Ambulatory Visit: Admitting: Oncology

## 2025-01-06 ENCOUNTER — Ambulatory Visit
